# Patient Record
Sex: Female | Born: 1940
Health system: Southern US, Community
[De-identification: ages and names within clinical notes are randomized; demographics above are authoritative.]

## PROBLEM LIST (undated history)

## (undated) DIAGNOSIS — H9190 Unspecified hearing loss, unspecified ear: Secondary | ICD-10-CM

## (undated) DIAGNOSIS — H903 Sensorineural hearing loss, bilateral: Secondary | ICD-10-CM

## (undated) DIAGNOSIS — E785 Hyperlipidemia, unspecified: Secondary | ICD-10-CM

## (undated) DIAGNOSIS — E559 Vitamin D deficiency, unspecified: Secondary | ICD-10-CM

## (undated) DIAGNOSIS — H409 Unspecified glaucoma: Secondary | ICD-10-CM

## (undated) DIAGNOSIS — C801 Malignant (primary) neoplasm, unspecified: Secondary | ICD-10-CM

## (undated) HISTORY — PX: TONSILLECTOMY: SUR1361

## (undated) HISTORY — DX: Hyperlipidemia, unspecified: E78.5

## (undated) HISTORY — PX: DILATION AND CURETTAGE OF UTERUS: SHX78

## (undated) HISTORY — DX: Unspecified glaucoma: H40.9

## (undated) HISTORY — DX: Malignant (primary) neoplasm, unspecified: C80.1

---

## 2001-03-30 HISTORY — PX: MASTECTOMY: SHX3

## 2001-04-06 HISTORY — PX: BREAST SURGERY: SHX581

## 2001-04-14 ENCOUNTER — Encounter: Payer: Self-pay | Admitting: General Surgery

## 2001-04-16 ENCOUNTER — Ambulatory Visit (HOSPITAL_COMMUNITY): Admission: RE | Admit: 2001-04-16 | Discharge: 2001-04-17 | Payer: Self-pay | Admitting: General Surgery

## 2001-05-11 ENCOUNTER — Ambulatory Visit (HOSPITAL_COMMUNITY): Admission: RE | Admit: 2001-05-11 | Discharge: 2001-05-11 | Payer: Self-pay | Admitting: *Deleted

## 2001-05-11 ENCOUNTER — Encounter: Payer: Self-pay | Admitting: *Deleted

## 2001-05-13 ENCOUNTER — Ambulatory Visit (HOSPITAL_COMMUNITY): Admission: RE | Admit: 2001-05-13 | Discharge: 2001-05-13 | Payer: Self-pay | Admitting: General Surgery

## 2001-05-13 ENCOUNTER — Encounter: Payer: Self-pay | Admitting: General Surgery

## 2001-07-07 ENCOUNTER — Encounter: Admission: RE | Admit: 2001-07-07 | Discharge: 2001-07-07 | Payer: Self-pay | Admitting: General Surgery

## 2001-07-07 ENCOUNTER — Encounter: Payer: Self-pay | Admitting: General Surgery

## 2001-08-24 ENCOUNTER — Ambulatory Visit (HOSPITAL_BASED_OUTPATIENT_CLINIC_OR_DEPARTMENT_OTHER): Admission: RE | Admit: 2001-08-24 | Discharge: 2001-08-24 | Payer: Self-pay | Admitting: General Surgery

## 2001-11-24 ENCOUNTER — Encounter: Admission: RE | Admit: 2001-11-24 | Discharge: 2001-12-01 | Payer: Self-pay | Admitting: *Deleted

## 2001-12-02 ENCOUNTER — Ambulatory Visit (HOSPITAL_COMMUNITY): Admission: RE | Admit: 2001-12-02 | Discharge: 2001-12-02 | Payer: Self-pay | Admitting: *Deleted

## 2001-12-02 ENCOUNTER — Encounter: Payer: Self-pay | Admitting: *Deleted

## 2001-12-23 ENCOUNTER — Encounter: Payer: Self-pay | Admitting: *Deleted

## 2001-12-23 ENCOUNTER — Encounter: Admission: RE | Admit: 2001-12-23 | Discharge: 2001-12-23 | Payer: Self-pay | Admitting: *Deleted

## 2001-12-30 ENCOUNTER — Other Ambulatory Visit: Admission: RE | Admit: 2001-12-30 | Discharge: 2001-12-30 | Payer: Self-pay | Admitting: Family Medicine

## 2002-12-27 ENCOUNTER — Ambulatory Visit (HOSPITAL_COMMUNITY): Admission: RE | Admit: 2002-12-27 | Discharge: 2002-12-27 | Payer: Self-pay | Admitting: *Deleted

## 2002-12-27 ENCOUNTER — Encounter: Payer: Self-pay | Admitting: *Deleted

## 2002-12-27 ENCOUNTER — Encounter: Admission: RE | Admit: 2002-12-27 | Discharge: 2002-12-27 | Payer: Self-pay | Admitting: *Deleted

## 2003-01-04 ENCOUNTER — Encounter: Admission: RE | Admit: 2003-01-04 | Discharge: 2003-01-04 | Payer: Self-pay | Admitting: *Deleted

## 2003-01-04 ENCOUNTER — Encounter: Payer: Self-pay | Admitting: *Deleted

## 2003-02-25 ENCOUNTER — Other Ambulatory Visit: Admission: RE | Admit: 2003-02-25 | Discharge: 2003-02-25 | Payer: Self-pay | Admitting: Family Medicine

## 2004-01-05 ENCOUNTER — Encounter: Admission: RE | Admit: 2004-01-05 | Discharge: 2004-01-05 | Payer: Self-pay | Admitting: Oncology

## 2004-02-29 ENCOUNTER — Other Ambulatory Visit: Admission: RE | Admit: 2004-02-29 | Discharge: 2004-02-29 | Payer: Self-pay | Admitting: Family Medicine

## 2004-12-17 ENCOUNTER — Ambulatory Visit: Payer: Self-pay | Admitting: Oncology

## 2005-01-07 ENCOUNTER — Encounter: Admission: RE | Admit: 2005-01-07 | Discharge: 2005-01-07 | Payer: Self-pay | Admitting: Oncology

## 2005-03-05 ENCOUNTER — Other Ambulatory Visit: Admission: RE | Admit: 2005-03-05 | Discharge: 2005-03-05 | Payer: Self-pay | Admitting: Family Medicine

## 2005-03-05 ENCOUNTER — Ambulatory Visit: Payer: Self-pay | Admitting: Family Medicine

## 2005-04-09 ENCOUNTER — Ambulatory Visit: Payer: Self-pay | Admitting: Family Medicine

## 2005-04-23 ENCOUNTER — Ambulatory Visit: Payer: Self-pay | Admitting: Internal Medicine

## 2005-04-23 ENCOUNTER — Ambulatory Visit: Payer: Self-pay | Admitting: Family Medicine

## 2005-05-15 ENCOUNTER — Ambulatory Visit: Payer: Self-pay | Admitting: Family Medicine

## 2005-06-21 ENCOUNTER — Ambulatory Visit: Payer: Self-pay | Admitting: Podiatry

## 2005-07-26 ENCOUNTER — Ambulatory Visit: Payer: Self-pay | Admitting: Family Medicine

## 2005-11-20 ENCOUNTER — Ambulatory Visit: Payer: Self-pay | Admitting: Family Medicine

## 2005-12-13 ENCOUNTER — Ambulatory Visit: Payer: Self-pay | Admitting: Podiatry

## 2005-12-30 ENCOUNTER — Ambulatory Visit: Payer: Self-pay | Admitting: Oncology

## 2005-12-31 LAB — LIPID PANEL
LDL Cholesterol: 105 mg/dL — ABNORMAL HIGH (ref 0–99)
Triglycerides: 115 mg/dL (ref <150)

## 2005-12-31 LAB — COMPREHENSIVE METABOLIC PANEL
ALT: 16 U/L (ref 0–40)
AST: 18 U/L (ref 0–37)
Albumin: 4.4 g/dL (ref 3.5–5.2)
Alkaline Phosphatase: 56 U/L (ref 39–117)
Potassium: 4.4 mEq/L (ref 3.5–5.3)
Sodium: 142 mEq/L (ref 135–145)
Total Protein: 6.5 g/dL (ref 6.0–8.3)

## 2005-12-31 LAB — CBC WITH DIFFERENTIAL/PLATELET
EOS%: 2.9 % (ref 0.0–7.0)
MCH: 31.8 pg (ref 26.0–34.0)
MCV: 93.2 fL (ref 81.0–101.0)
MONO%: 7.4 % (ref 0.0–13.0)
NEUT#: 3.2 10*3/uL (ref 1.5–6.5)
RBC: 4.14 10*6/uL (ref 3.70–5.32)
RDW: 13.3 % (ref 11.3–14.5)

## 2005-12-31 LAB — TSH: TSH: 2.119 u[IU]/mL (ref 0.350–5.500)

## 2006-01-21 ENCOUNTER — Encounter: Admission: RE | Admit: 2006-01-21 | Discharge: 2006-01-21 | Payer: Self-pay | Admitting: Oncology

## 2006-03-06 ENCOUNTER — Encounter: Payer: Self-pay | Admitting: Family Medicine

## 2006-03-06 ENCOUNTER — Other Ambulatory Visit: Admission: RE | Admit: 2006-03-06 | Discharge: 2006-03-06 | Payer: Self-pay | Admitting: Family Medicine

## 2006-03-06 ENCOUNTER — Ambulatory Visit: Payer: Self-pay | Admitting: Family Medicine

## 2006-03-06 LAB — CONVERTED CEMR LAB: Pap Smear: NORMAL

## 2006-03-25 ENCOUNTER — Ambulatory Visit: Payer: Self-pay | Admitting: Family Medicine

## 2006-04-17 ENCOUNTER — Ambulatory Visit: Payer: Self-pay | Admitting: Family Medicine

## 2006-07-21 ENCOUNTER — Ambulatory Visit: Payer: Self-pay | Admitting: Family Medicine

## 2006-11-04 ENCOUNTER — Ambulatory Visit: Payer: Self-pay | Admitting: Family Medicine

## 2006-11-05 ENCOUNTER — Ambulatory Visit: Payer: Self-pay | Admitting: Gynecology

## 2006-12-22 ENCOUNTER — Ambulatory Visit: Payer: Self-pay | Admitting: Oncology

## 2006-12-24 LAB — COMPREHENSIVE METABOLIC PANEL
Alkaline Phosphatase: 55 U/L (ref 39–117)
Glucose, Bld: 123 mg/dL — ABNORMAL HIGH (ref 70–99)
Sodium: 141 mEq/L (ref 135–145)
Total Bilirubin: 0.4 mg/dL (ref 0.3–1.2)
Total Protein: 6.6 g/dL (ref 6.0–8.3)

## 2006-12-24 LAB — CBC WITH DIFFERENTIAL/PLATELET
Eosinophils Absolute: 0.1 10*3/uL (ref 0.0–0.5)
LYMPH%: 29.9 % (ref 14.0–48.0)
MCHC: 35.2 g/dL (ref 32.0–36.0)
MCV: 92.4 fL (ref 81.0–101.0)
MONO%: 6 % (ref 0.0–13.0)
Platelets: 171 10*3/uL (ref 145–400)
RBC: 4.07 10*6/uL (ref 3.70–5.32)

## 2007-01-26 ENCOUNTER — Encounter: Admission: RE | Admit: 2007-01-26 | Discharge: 2007-01-26 | Payer: Self-pay | Admitting: Family Medicine

## 2007-03-09 ENCOUNTER — Telehealth (INDEPENDENT_AMBULATORY_CARE_PROVIDER_SITE_OTHER): Payer: Self-pay | Admitting: *Deleted

## 2007-04-01 ENCOUNTER — Encounter: Payer: Self-pay | Admitting: Family Medicine

## 2007-04-01 DIAGNOSIS — Z853 Personal history of malignant neoplasm of breast: Secondary | ICD-10-CM | POA: Insufficient documentation

## 2007-04-01 DIAGNOSIS — N318 Other neuromuscular dysfunction of bladder: Secondary | ICD-10-CM | POA: Insufficient documentation

## 2007-04-01 DIAGNOSIS — Z13220 Encounter for screening for lipoid disorders: Secondary | ICD-10-CM | POA: Insufficient documentation

## 2007-04-01 DIAGNOSIS — M810 Age-related osteoporosis without current pathological fracture: Secondary | ICD-10-CM | POA: Insufficient documentation

## 2007-05-05 ENCOUNTER — Ambulatory Visit: Payer: Self-pay | Admitting: Family Medicine

## 2007-05-05 DIAGNOSIS — R7989 Other specified abnormal findings of blood chemistry: Secondary | ICD-10-CM | POA: Insufficient documentation

## 2007-05-06 LAB — CONVERTED CEMR LAB
BUN: 14 mg/dL (ref 6–23)
CO2: 31 meq/L (ref 19–32)
GFR calc Af Amer: 128 mL/min
Hgb A1c MFr Bld: 5.1 % (ref 4.6–6.0)
Phosphorus: 3.6 mg/dL (ref 2.3–4.6)
Potassium: 4.1 meq/L (ref 3.5–5.1)

## 2007-05-13 ENCOUNTER — Ambulatory Visit: Payer: Self-pay | Admitting: Family Medicine

## 2007-05-20 ENCOUNTER — Encounter (INDEPENDENT_AMBULATORY_CARE_PROVIDER_SITE_OTHER): Payer: Self-pay | Admitting: *Deleted

## 2007-10-06 ENCOUNTER — Telehealth: Payer: Self-pay | Admitting: Family Medicine

## 2007-12-24 ENCOUNTER — Ambulatory Visit: Payer: Self-pay | Admitting: Oncology

## 2007-12-28 LAB — CBC WITH DIFFERENTIAL/PLATELET
Basophils Absolute: 0 10*3/uL (ref 0.0–0.1)
EOS%: 2.2 % (ref 0.0–7.0)
HGB: 13.1 g/dL (ref 11.6–15.9)
MCH: 32.3 pg (ref 26.0–34.0)
NEUT#: 3.5 10*3/uL (ref 1.5–6.5)
RDW: 13.5 % (ref 11.3–14.5)
WBC: 5.5 10*3/uL (ref 3.9–10.0)
lymph#: 1.4 10*3/uL (ref 0.9–3.3)

## 2007-12-28 LAB — COMPREHENSIVE METABOLIC PANEL
ALT: 10 U/L (ref 0–35)
AST: 14 U/L (ref 0–37)
Albumin: 4.6 g/dL (ref 3.5–5.2)
BUN: 15 mg/dL (ref 6–23)
Calcium: 9.9 mg/dL (ref 8.4–10.5)
Chloride: 106 mEq/L (ref 96–112)
Potassium: 3.7 mEq/L (ref 3.5–5.3)

## 2008-01-18 ENCOUNTER — Encounter: Payer: Self-pay | Admitting: Internal Medicine

## 2008-01-18 ENCOUNTER — Encounter: Payer: Self-pay | Admitting: Family Medicine

## 2008-01-28 ENCOUNTER — Encounter: Admission: RE | Admit: 2008-01-28 | Discharge: 2008-01-28 | Payer: Self-pay | Admitting: Oncology

## 2008-04-11 ENCOUNTER — Telehealth: Payer: Self-pay | Admitting: Family Medicine

## 2008-04-11 DIAGNOSIS — L0293 Carbuncle, unspecified: Secondary | ICD-10-CM

## 2008-04-11 DIAGNOSIS — L0292 Furuncle, unspecified: Secondary | ICD-10-CM | POA: Insufficient documentation

## 2008-04-12 ENCOUNTER — Telehealth: Payer: Self-pay | Admitting: Family Medicine

## 2008-05-09 ENCOUNTER — Ambulatory Visit: Payer: Self-pay | Admitting: Family Medicine

## 2008-05-09 DIAGNOSIS — H6120 Impacted cerumen, unspecified ear: Secondary | ICD-10-CM | POA: Insufficient documentation

## 2008-05-10 LAB — CONVERTED CEMR LAB
ALT: 16 units/L (ref 0–35)
AST: 19 units/L (ref 0–37)
Basophils Relative: 0.9 % (ref 0.0–3.0)
Calcium: 9.2 mg/dL (ref 8.4–10.5)
Cholesterol: 214 mg/dL (ref 0–200)
Direct LDL: 105.6 mg/dL
Eosinophils Relative: 2.7 % (ref 0.0–5.0)
GFR calc Af Amer: 128 mL/min
HCT: 38.7 % (ref 36.0–46.0)
Hemoglobin: 13.4 g/dL (ref 12.0–15.0)
MCHC: 34.5 g/dL (ref 30.0–36.0)
MCV: 95.6 fL (ref 78.0–100.0)
Monocytes Absolute: 0.4 10*3/uL (ref 0.1–1.0)
Monocytes Relative: 7.1 % (ref 3.0–12.0)
Neutro Abs: 3.4 10*3/uL (ref 1.4–7.7)
Phosphorus: 3.5 mg/dL (ref 2.3–4.6)
Potassium: 4.2 meq/L (ref 3.5–5.1)
RDW: 12.6 % (ref 11.5–14.6)
Sodium: 144 meq/L (ref 135–145)
TSH: 1.41 microintl units/mL (ref 0.35–5.50)
Total Protein: 6.4 g/dL (ref 6.0–8.3)

## 2008-05-12 LAB — CONVERTED CEMR LAB: Vit D, 1,25-Dihydroxy: 45 (ref 30–89)

## 2008-05-26 ENCOUNTER — Ambulatory Visit: Payer: Self-pay | Admitting: Family Medicine

## 2008-05-26 LAB — CONVERTED CEMR LAB
OCCULT 2: NEGATIVE
OCCULT 3: NEGATIVE

## 2008-09-30 HISTORY — PX: EYE SURGERY: SHX253

## 2009-01-26 ENCOUNTER — Ambulatory Visit: Payer: Self-pay | Admitting: Oncology

## 2009-01-27 ENCOUNTER — Emergency Department: Payer: Self-pay | Admitting: Emergency Medicine

## 2009-01-27 ENCOUNTER — Encounter: Payer: Self-pay | Admitting: Family Medicine

## 2009-01-30 ENCOUNTER — Telehealth: Payer: Self-pay | Admitting: Family Medicine

## 2009-01-30 DIAGNOSIS — M549 Dorsalgia, unspecified: Secondary | ICD-10-CM | POA: Insufficient documentation

## 2009-02-06 ENCOUNTER — Encounter: Admission: RE | Admit: 2009-02-06 | Discharge: 2009-02-06 | Payer: Self-pay | Admitting: Oncology

## 2009-02-06 ENCOUNTER — Encounter: Payer: Self-pay | Admitting: Family Medicine

## 2009-02-06 LAB — LACTATE DEHYDROGENASE: LDH: 134 U/L (ref 94–250)

## 2009-02-06 LAB — COMPREHENSIVE METABOLIC PANEL
BUN: 24 mg/dL — ABNORMAL HIGH (ref 6–23)
CO2: 29 mEq/L (ref 19–32)
Creatinine, Ser: 0.6 mg/dL (ref 0.40–1.20)
Glucose, Bld: 118 mg/dL — ABNORMAL HIGH (ref 70–99)
Total Bilirubin: 0.3 mg/dL (ref 0.3–1.2)
Total Protein: 6.6 g/dL (ref 6.0–8.3)

## 2009-02-06 LAB — CBC WITH DIFFERENTIAL/PLATELET
Basophils Absolute: 0 10*3/uL (ref 0.0–0.1)
Eosinophils Absolute: 0.1 10*3/uL (ref 0.0–0.5)
HCT: 37.3 % (ref 34.8–46.6)
LYMPH%: 23.1 % (ref 14.0–49.7)
MONO#: 0.4 10*3/uL (ref 0.1–0.9)
NEUT#: 3.5 10*3/uL (ref 1.5–6.5)
NEUT%: 67.7 % (ref 38.4–76.8)
Platelets: 207 10*3/uL (ref 145–400)
WBC: 5.1 10*3/uL (ref 3.9–10.3)

## 2009-02-08 ENCOUNTER — Encounter: Payer: Self-pay | Admitting: Family Medicine

## 2009-02-20 ENCOUNTER — Encounter: Payer: Self-pay | Admitting: Family Medicine

## 2009-05-10 ENCOUNTER — Other Ambulatory Visit: Admission: RE | Admit: 2009-05-10 | Discharge: 2009-05-10 | Payer: Self-pay | Admitting: Family Medicine

## 2009-05-10 ENCOUNTER — Encounter: Payer: Self-pay | Admitting: Family Medicine

## 2009-05-10 ENCOUNTER — Ambulatory Visit: Payer: Self-pay | Admitting: Family Medicine

## 2009-05-10 DIAGNOSIS — Z8781 Personal history of (healed) traumatic fracture: Secondary | ICD-10-CM

## 2009-05-10 DIAGNOSIS — M8448XA Pathological fracture, other site, initial encounter for fracture: Secondary | ICD-10-CM | POA: Insufficient documentation

## 2009-05-12 ENCOUNTER — Encounter (INDEPENDENT_AMBULATORY_CARE_PROVIDER_SITE_OTHER): Payer: Self-pay | Admitting: *Deleted

## 2009-05-12 LAB — CONVERTED CEMR LAB
AST: 23 units/L (ref 0–37)
Alkaline Phosphatase: 55 units/L (ref 39–117)
Bilirubin, Direct: 0.2 mg/dL (ref 0.0–0.3)
CO2: 31 meq/L (ref 19–32)
Creatinine, Ser: 0.6 mg/dL (ref 0.4–1.2)
HDL: 76 mg/dL (ref >39.00)
LDL Cholesterol: 99 mg/dL (ref 0–99)
Phosphorus: 3.9 mg/dL (ref 2.3–4.6)
Sodium: 140 meq/L (ref 135–145)
Total Bilirubin: 0.9 mg/dL (ref 0.3–1.2)
Total CHOL/HDL Ratio: 3

## 2009-05-17 ENCOUNTER — Encounter: Payer: Self-pay | Admitting: Family Medicine

## 2009-05-17 ENCOUNTER — Ambulatory Visit: Payer: Self-pay | Admitting: Internal Medicine

## 2009-06-01 ENCOUNTER — Encounter (INDEPENDENT_AMBULATORY_CARE_PROVIDER_SITE_OTHER): Payer: Self-pay | Admitting: *Deleted

## 2009-06-02 ENCOUNTER — Encounter: Payer: Self-pay | Admitting: Family Medicine

## 2010-02-06 ENCOUNTER — Ambulatory Visit: Payer: Self-pay | Admitting: Oncology

## 2010-02-07 ENCOUNTER — Encounter: Payer: Self-pay | Admitting: Family Medicine

## 2010-02-07 ENCOUNTER — Encounter: Admission: RE | Admit: 2010-02-07 | Discharge: 2010-02-07 | Payer: Self-pay | Admitting: Oncology

## 2010-02-07 LAB — COMPREHENSIVE METABOLIC PANEL
ALT: 14 U/L (ref 0–35)
AST: 17 U/L (ref 0–37)
Alkaline Phosphatase: 56 U/L (ref 39–117)
Sodium: 140 mEq/L (ref 135–145)
Total Bilirubin: 0.5 mg/dL (ref 0.3–1.2)
Total Protein: 6.4 g/dL (ref 6.0–8.3)

## 2010-02-07 LAB — CBC WITH DIFFERENTIAL/PLATELET
EOS%: 2.9 % (ref 0.0–7.0)
HGB: 12.9 g/dL (ref 11.6–15.9)
MCH: 32.3 pg (ref 25.1–34.0)
MCV: 92.9 fL (ref 79.5–101.0)
MONO%: 6.3 % (ref 0.0–14.0)
NEUT#: 2.9 10*3/uL (ref 1.5–6.5)
RBC: 3.98 10*6/uL (ref 3.70–5.45)
RDW: 13.2 % (ref 11.2–14.5)
WBC: 4.6 10*3/uL (ref 3.9–10.3)

## 2010-02-12 ENCOUNTER — Encounter: Payer: Self-pay | Admitting: Family Medicine

## 2010-02-23 ENCOUNTER — Emergency Department: Payer: Self-pay | Admitting: Emergency Medicine

## 2010-04-26 ENCOUNTER — Encounter: Payer: Self-pay | Admitting: Orthopedic Surgery

## 2010-04-30 ENCOUNTER — Encounter: Payer: Self-pay | Admitting: Orthopedic Surgery

## 2010-05-11 ENCOUNTER — Telehealth (INDEPENDENT_AMBULATORY_CARE_PROVIDER_SITE_OTHER): Payer: Self-pay | Admitting: *Deleted

## 2010-05-11 ENCOUNTER — Ambulatory Visit: Payer: Self-pay | Admitting: Family Medicine

## 2010-05-12 LAB — CONVERTED CEMR LAB
AST: 19 units/L (ref 0–37)
BUN: 21 mg/dL (ref 6–23)
Calcium: 9.3 mg/dL (ref 8.4–10.5)
Cholesterol: 193 mg/dL (ref 0–200)
GFR calc non Af Amer: 113.74 mL/min (ref >60)
HDL: 78.9 mg/dL (ref >39.00)
Phosphorus: 3.6 mg/dL (ref 2.3–4.6)
TSH: 1.67 microintl units/mL (ref 0.35–5.50)
Triglycerides: 79 mg/dL (ref 0.0–149.0)
VLDL: 15.8 mg/dL (ref 0.0–40.0)
Vit D, 25-Hydroxy: 56 ng/mL (ref 30–89)

## 2010-05-15 ENCOUNTER — Ambulatory Visit: Payer: Self-pay | Admitting: Family Medicine

## 2010-06-26 ENCOUNTER — Ambulatory Visit: Payer: Self-pay | Admitting: Family Medicine

## 2010-06-26 DIAGNOSIS — R42 Dizziness and giddiness: Secondary | ICD-10-CM | POA: Insufficient documentation

## 2010-06-28 LAB — CONVERTED CEMR LAB
Hemoglobin: 13.4 g/dL (ref 12.0–15.0)
Iron: 103 ug/dL (ref 42–145)

## 2010-07-04 ENCOUNTER — Encounter: Payer: Self-pay | Admitting: Family Medicine

## 2010-10-05 ENCOUNTER — Telehealth: Payer: Self-pay | Admitting: Family Medicine

## 2010-10-20 ENCOUNTER — Other Ambulatory Visit: Payer: Self-pay | Admitting: Oncology

## 2010-10-20 DIAGNOSIS — Z0000 Encounter for general adult medical examination without abnormal findings: Secondary | ICD-10-CM

## 2010-10-20 DIAGNOSIS — Z9010 Acquired absence of unspecified breast and nipple: Secondary | ICD-10-CM

## 2010-10-22 ENCOUNTER — Encounter: Payer: Self-pay | Admitting: Oncology

## 2010-10-30 NOTE — Letter (Signed)
[facility]   Imported By: Lanelle Bal 02/21/2010 10:57:13  _____________________________________________________________________  External Attachment:    Type:   Image     Comment:   External Document

## 2010-10-30 NOTE — Assessment & Plan Note (Signed)
Summary: CHECK UP/CLE   Vital Signs:  Patient profile:   70 year old female Height:      65 inches Weight:      113.75 pounds BMI:     19.00 Temp:     98.2 degrees F oral Pulse rate:   64 / minute Pulse rhythm:   regular BP sitting:   122 / 68  (left arm) Cuff size:   regular  Vitals Entered By: Lewanda Rife LPN (May 15, 2010 10:45 AM) CC: CPX LMP 20 yrs ago   History of Present Illness: here for check up of chronic med problems and to [facility] list   has been feeling good overall   fx her L wrist on may 27-no surgery , trip and fall pretty hard  is healed up pretty well  going to therapy   wt is down 1 lb  bp is 122/68- tood  takes care of herself and does a lot of walking  is getting back to her usual   lipids good control with trig 79 and HDL 78 and LDL 98  vit D good at 56-- on current dose  will be due dexa 8/12   pap 8/10 no new symptoms or sexual partners   mam was nl in 5/11 was taken off the arimidex for 9 years  is cancer free sees Dr Marlena Clipper  is done with fosamax    colonosc nl 04-- is good until 2014   Td 04  ptx 09  will get flu shot at hospital  has had zostavax  recent basal cell lesion on nose- getting set up for mohs proceedure    Allergies (verified): No Known Drug Allergies  Past History:  Family History: Last updated: 05/09/2008 Father: CVA, HTN, OP CHF, TB Mother: CHF, TB, HTN Siblings:  2 cousins with breast ca brother ALS-- passed away January 03, 2023  Social History: Last updated: 05/09/2008 Marital Status: Married Children: 1 Occupation: Clinical biochemist non smoker  no alcohol  volunteers at [employer] walks regularly for exercis   Risk Factors: Smoking Status: never (04/01/2007)  Past Medical History: Breast cancer, hx of Hyperlipidemia Osteoporosis hold in L TM- baseline basal cell lesion on nose --moh's proceedure   surg- Dr Epimenio Foot ortho  Past Surgical  History: Mastectomy Tonsillectomy D & C Dexa- OP (11/1998)  OP (03/2003),  OP (03/2005) Stress fracture (08/2000) Colonoscopy- neg (03/2003) fractured L wrist 5/11 - trip and fall   Review of Systems General:  Denies fatigue, loss of appetite, and malaise. Eyes:  Denies blurring and eye irritation. CV:  Denies chest pain or discomfort, lightheadness, palpitations, and shortness of breath with exertion. Resp:  Denies cough, shortness of breath, and wheezing. GI:  Denies abdominal pain, change in bowel habits, indigestion, and nausea. GU:  Denies dysuria, incontinence, and urinary frequency. MS:  Denies joint pain and cramps. Derm:  Denies itching, lesion(s), poor wound healing, and rash. Neuro:  Denies numbness and tingling. Psych:  Denies anxiety and depression. Endo:  Denies cold intolerance, excessive thirst, excessive urination, and heat intolerance. Heme:  Denies abnormal bruising and bleeding.  Physical Exam  General:  Well-developed,well-nourished,in no acute distress; alert,appropriate and cooperative throughout examination Head:  normocephalic, atraumatic, and no abnormalities observed.   Eyes:  vision grossly intact, pupils equal, pupils round, and pupils reactive to light.  no conjunctival pallor, injection or icterus  Ears:  R ear normal and L ear normal.  - mild cerumen bilat  Nose:  no nasal discharge.  Mouth:  pharynx pink and moist.   Neck:  supple with full rom and no masses or thyromegally, no JVD or carotid bruit  Chest Wall:  No deformities, masses, or tenderness noted. Breasts:  nl R breast exam- no M, skin change or nipple d/c L side - mastectomy site clear/ no M or axillary change  Lungs:  Normal respiratory effort, chest expands symmetrically. Lungs are clear to auscultation, no crackles or wheezes. Heart:  Normal rate and regular rhythm. S1 and S2 normal without gallop, murmur, click, rub or other extra sounds. Abdomen:  Bowel sounds positive,abdomen soft  and non-tender without masses, organomegaly or hernias noted.  no renal bruits  Msk:  No deformity or scoliosis noted of thoracic or lumbar spine.  no acute joint changes  Pulses:  R and L carotid,radial,femoral,dorsalis pedis and posterior tibial pulses are full and equal bilaterally Extremities:  No clubbing, cyanosis, edema, or deformity noted with normal full range of motion of all joints.   Neurologic:  sensation intact to light touch, gait normal, and DTRs symmetrical and normal.   Skin:  Intact without suspicious lesions or rashes Cervical Nodes:  No lymphadenopathy noted Inguinal Nodes:  No significant adenopathy Psych:  normal affect, talkative and pleasant    Impression & Recommendations:  Problem # 1:  OSTEOPOROSIS (ICD-733.00) Assessment Unchanged did have fx this year will stop fosamax since she has been on it over 5 years  disc ca and D  good exercise dexa in 1 y The following medications were removed from the medication list:    Fosamax 70 Mg Tabs (Alendronate sodium) .Marland Kitchen... 1 by mouth weekly  Problem # 2:  HYPERLIPIDEMIA (ICD-272.4) Assessment: Unchanged  good lipids with healthy diet today  Labs Reviewed: SGOT: 19 (05/11/2010)   SGPT: 13 (05/11/2010)   HDL:78.90 (05/11/2010), 76.00 (05/10/2009)  LDL:98 (05/11/2010), 99 (05/10/2009)  Chol:193 (05/11/2010), 191 (05/10/2009)  Trig:79.0 (05/11/2010), 82.0 (05/10/2009)  Problem # 3:  BREAST CANCER, HX OF (ICD-V10.3) Assessment: Unchanged doing very well with close f/u  off arimidex sent for last mam nl breast exam   Complete Medication List: 1)  Lumigan 0.03 % Soln (Bimatoprost) .... Use as directed 2)  Adult Aspirin Low Strength 81 Mg Tbdp (Aspirin) .... Take one by mouth daily 3)  Multiple Vitamins Tabs (Multiple vitamin) 4)  Calcium  .... Take 1200 mg by mouth qd 5)  Timolol Maleate 0.5 % Soln (Timolol maleate) .... 6.8mg  every morning 6)  Vitamin B-12 500 Mcg Tabs (Cyanocobalamin) .... Daily  Patient  Instructions: 1)  send for last mammogram please from breast center  2)  stop the fosamax since you have been on it for over 5 years  3)  try claritin first for allergies  4)  keep up good health habits   Current Allergies (reviewed today): No known allergies

## 2010-10-30 NOTE — Letter (Signed)
Summary: [facility]   Imported By: Sherian Rein 07/31/2010 09:27:59  _____________________________________________________________________  External Attachment:    Type:   Image     Comment:   External Document

## 2010-10-30 NOTE — Progress Notes (Signed)
----   Converted from flag ---- ---- 05/11/2010 8:52 AM, Colon Flattery Tower MD wrote: please check lipid/ renal /ast/alt/tsh/ vit D level- 733.0, 272- thanks   ---- 05/11/2010 8:45 AM, Liane Comber CMA (AAMA) wrote: Pt is scheduled for cpx labs tomorrow, what labs to draw and dx codes? Thanks Tasha ------------------------------

## 2010-10-30 NOTE — Assessment & Plan Note (Signed)
Summary: Dizziness and nausea   Vital Signs:  Patient profile:   70 year old female Height:      65 inches Weight:      112.25 pounds BMI:     18.75 Temp:     97.9 degrees F oral Pulse rate:   64 / minute Pulse rhythm:   regular BP sitting:   106 / 72  (left arm) Cuff size:   regular  Vitals Entered ByMelody Comas (June 26, 2010 3:43 PM) CC: dizziness and nausea    History of Present Illness: Started yesterday, was dizzy on standing.  Intermittent symptoms yesterday, but resolved by 11AM.  Yesterday afternoon patient was fine and walked 3 miles last night.  No pre/syncope.   This AM with similar symptoms that didn't resolve as quickly; feeling some better now.   BP was wnl at home earlier today.  H/o iron def anemia.   No F/C/CP/SOB/BLE edema. Some nausea earlier today when vertigo was at its worst.   No others with similar symptoms.  No rhinorrhea/ear pain.  H/o seasonal allergies but no OTC allergy meds since the weekend.  No tinnitus.   Off fosamax and off arimidex since the summer.   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  Otherwise negative.   No focal neuro changes/speech changes.   Physical Exam  General:  GEN: nad, alert and oriented HEENT: mucous membranes moist, TM w/o acute change, nasal epithelium wnl, OP wnl NECK: supple w/o LA CV: rrr.  no murmur PULM: ctab, no inc wob ABD: soft, +bs EXT: no edema SKIN: no acute rash  CN 2-12 wnl B, S/S/DTR wnl x4, DHP weakly positive   Impression & Recommendations:  Problem # 1:  INTERMITTENT VERTIGO (ICD-780.4) Likely BPV.  will check labs per patient request.  Anatomy d/w patient.  No indication for further work up now.  See notes on labs.  follow up as needed.  Sedation caution for antivert.  Orders: TLB-Iron,  (Fe) Total (83540-FE) TLB-Hemoglobin (Hgb) (85018-HGB)  Her updated medication list for this problem includes:    Antivert 12.5 Mg Tabs (Meclizine hcl) .Marland Kitchen... 1/2 to 1 tab by mouth three times a day as needed for vertigo  Complete Medication List: 1)  Lumigan 0.03 % Soln (Bimatoprost) .... Use as directed 2)  Adult Aspirin Low Strength 81 Mg Tbdp (Aspirin) .... Take one by mouth daily 3)  Multiple Vitamins Tabs (Multiple vitamin) 4)  Calcium  .... Take 1200 mg by mouth qd 5)  Timolol Maleate 0.5 % Soln (Timolol maleate) .... 6.8mg  every morning 6)  Vitamin B-12 500 Mcg Tabs (Cyanocobalamin) .... Daily 7)  Antivert 12.5 Mg Tabs (Meclizine hcl) .... 1/2 to 1 tab by mouth three times a day as needed for vertigo  Patient Instructions: 1)  Use the antivert as needed.  This should gradually get better.  Call us if you continue to have symptoms.  Take care.  Glad to see you today.  Prescriptions: ANTIVERT 12.5 MG TABS (MECLIZINE HCL) 1/2 to 1 tab by mouth three times a day as needed for vertigo  #30 x 1   Entered and Authorized by:   Crawford Givens MD   Signed by:   Crawford Givens MD on 06/26/2010   Method used:   Electronically to  [facility] [phone] * (retail)       [address]       [place], [place]  [address]       Ph: [phone] or [phone]       Fax: [phone]   RxID:   [id number]

## 2010-11-01 NOTE — Progress Notes (Signed)
Summary: requests antibiotic  Phone Note Call from Patient Call back at Home Phone [phone] Call back at [phone]   Caller: Patient Call For: Judith Part MD Summary of Call: Pt is going out of town for a week and is asking for a written script for antibiotic to take with her, in case she develops boils.  She says you have done this for her before, gave cephalexin in the past.  Please call when ready. Initial call taken by: Lowella Petties CMA, AAMA,  October 05, 2010 8:45 AM  Follow-up for Phone Call        she does have hydraadenitis with recurrent boils let her know to f/u if she gets one that is unresponsive to med or fever/ etc px written on EMR for call in  Follow-up by: Judith Part MD,  October 05, 2010 1:22 PM  Additional Follow-up for Phone Call Additional follow up Details #1::        Advised pt, script placed up front for pick up. Additional Follow-up by: Lowella Petties CMA, AAMA,  October 05, 2010 3:29 PM    New/Updated Medications: KEFLEX 500 MG CAPS (CEPHALEXIN) 1 by mouth two times a day for 7 days for boil Prescriptions: KEFLEX 500 MG CAPS (CEPHALEXIN) 1 by mouth two times a day for 7 days for boil  #14 x 0   Entered and Authorized by:   Judith Part MD   Signed by:   Judith Part MD on 10/05/2010   Method used:   Print then Give to Patient   RxID:   [id number]

## 2010-11-07 ENCOUNTER — Ambulatory Visit: Payer: Self-pay | Admitting: Ophthalmology

## 2011-02-11 ENCOUNTER — Ambulatory Visit
Admission: RE | Admit: 2011-02-11 | Discharge: 2011-02-11 | Disposition: A | Discharge status: Home / Self Care | Payer: Medicare Other | Source: Ambulatory Visit | Attending: Oncology | Admitting: Oncology

## 2011-02-11 ENCOUNTER — Other Ambulatory Visit: Payer: Self-pay | Admitting: Oncology

## 2011-02-11 ENCOUNTER — Encounter (HOSPITAL_BASED_OUTPATIENT_CLINIC_OR_DEPARTMENT_OTHER): Payer: Medicare Other | Admitting: Oncology

## 2011-02-11 DIAGNOSIS — Z170 Estrogen receptor positive status [ER+]: Secondary | ICD-10-CM

## 2011-02-11 DIAGNOSIS — Z0000 Encounter for general adult medical examination without abnormal findings: Secondary | ICD-10-CM

## 2011-02-11 DIAGNOSIS — C50419 Malignant neoplasm of upper-outer quadrant of unspecified female breast: Secondary | ICD-10-CM

## 2011-02-11 DIAGNOSIS — Z9010 Acquired absence of unspecified breast and nipple: Secondary | ICD-10-CM

## 2011-02-11 LAB — HM MAMMOGRAPHY

## 2011-02-11 LAB — CBC WITH DIFFERENTIAL/PLATELET
BASO%: 0.5 % (ref 0.0–2.0)
Eosinophils Absolute: 0.1 10*3/uL (ref 0.0–0.5)
LYMPH%: 24.5 % (ref 14.0–49.7)
MCHC: 34 g/dL (ref 31.5–36.0)
MCV: 94.7 fL (ref 79.5–101.0)
MONO%: 6.5 % (ref 0.0–14.0)
NEUT#: 3.3 10*3/uL (ref 1.5–6.5)
Platelets: 158 10*3/uL (ref 145–400)
RBC: 4.27 10*6/uL (ref 3.70–5.45)
RDW: 13.7 % (ref 11.2–14.5)
WBC: 4.9 10*3/uL (ref 3.9–10.3)

## 2011-02-11 LAB — COMPREHENSIVE METABOLIC PANEL
Albumin: 4.4 g/dL (ref 3.5–5.2)
BUN: 17 mg/dL (ref 6–23)
CO2: 29 mEq/L (ref 19–32)
Calcium: 9.6 mg/dL (ref 8.4–10.5)
Chloride: 105 mEq/L (ref 96–112)
Glucose, Bld: 90 mg/dL (ref 70–99)
Potassium: 4.4 mEq/L (ref 3.5–5.3)
Sodium: 140 mEq/L (ref 135–145)
Total Protein: 6.8 g/dL (ref 6.0–8.3)

## 2011-02-11 LAB — LACTATE DEHYDROGENASE: LDH: 154 U/L (ref 94–250)

## 2011-02-15 NOTE — Group Therapy Note (Signed)
NAMEMarland Kitchen  ANTHONIA, MONGER NO.:  [id number]   MEDICAL RECORD NO.:  [id number]         PATIENT TYPE:  POB   LOCATION[facility]  [facility]                   FACILITY:  [facility]   PHYSICIAN:  Ginger Carne, MD DATE OF BIRTH:  1941-04-13   DATE OF SERVICE:                                  CLINIC NOTE   I just completed her dictation.  Please send a copy of this dictation  that I have just previously dictated to Dr. Philis Pique at the [facility] in [place], [place].           ______________________________  Ginger Carne, MD     SHB/MEDQ  D:  11/05/2006  T:  11/05/2006  Job:  [id number]   cc:   Philis Pique, M.D.  [address]  [place], [place]

## 2011-02-15 NOTE — Assessment & Plan Note (Signed)
[facility]                                 ON-CALL NOTE   NAME:[name], Kathy                          MRN:          [id number]  DATE:11/01/2006                            DOB:          Oct 22, 1940    TELEPHONE CALL   TIME:  12:20 p.m.   OBJECTIVE:  The patient has an infection on her buttock. Thinks it is a  gland. Has had that kind of thing before. Declines an appointment in the  office. Will wait until Monday to be seen.   PRIMARY CARE PHYSICIAN:  Unknown.     Arta Silence, MD  Electronically Signed    RNS/MedQ  DD: 11/01/2006  DT: 11/01/2006  Job #: [id number]

## 2011-02-15 NOTE — Op Note (Signed)
[place]. [facility]  Patient:    Kathy [name], Kathy [name]                        MRN: [id number] Proc. Date: 04/16/01 Adm. Date:  [id number] Disc. Date: [id number] Attending:  Chevis Pretty S                           Operative Report  PREOPERATIVE DIAGNOSIS:  Left breast cancer.  POSTOPERATIVE DIAGNOSIS:  Left breast cancer.  OPERATION PERFORMED:  Left modified radical mastectomy.  SURGEON:  Chevis Pretty, M.D.  ASSISTANT:  Rose Phi. Maple Hudson, M.D.  ANESTHESIA:  General endotracheal.  DESCRIPTION OF PROCEDURE:  After informed consent was obtained, the patient was brought to the operating room and placed in supine position on the operating room table.  After adequate induction of general anesthesia. The patients left chest area was prepped with Betadine and draped in the usual sterile manner.  The breast was then marked for an elliptical incision that would include the nipple areolar complex as well as the old excisional biopsy site.  The incisions were then made along these lines with a 10 blade knife. The incisions were carried down through the skin and superficial subcutaneous tissues using the Bovie electrocautery.  Skin hooks were then used to elevate the skin edges towards the ceiling and dissection was then carried out using the Bovie electrocautery.  Thin skin flaps were then created using the Bovie electrocautery and retraction until the breast tissue was separated from the skin and subcutaneous tissues.  The dissection was carried under each skin flap until the chest wall musculature was encountered.  Once the edges of the breast tissue were identified, the breast was then removed from the muscles of the chest wall taking care to take the investing fascia with it.  The breast was removed from the muscles of the chest wall from a superior to inferior manner.  Once this was complete, dissection was carried into the axilla.  The boundaries of the dissection for the  axilla were identified including the axillary vein superiorly, the latissimus laterally and inferiorly and the chest wall medially.  Care was taken to identify the thoracodorsal neurovascular bundle as well as the long thoracic nerve and avoid these two structures.  The lymphatic and axillary contents within all these boundaries were then removed by combination of blunt dissection with a right angle clamp as well as sharp dissection with the Bovie electrocautery.  Several small arteries and veins did require placement of clips for maintenance of hemostasis.  Once this was complete, the entire specimen was removed from the patient en bloc and sent to pathology for further evaluation.  Two small stab incisions were made inferiorly and laterally to the operative site and using a tonsil clamp, drains were brought through these incisions.  The medial drain was made to lie in the area that was created by the skin flaps of the breast. The lateral drain was made to lie at the apex of the axilla.  These drains were anchored in place with 3-0 nylon stitches.  The subcutaneous tissues were then reapproximated using interrupted 3-0 Vicryl sutures and the skin incision was closed with a running 4-0 Monocryl subcuticular stitch.  Benzoin and Steri-Strips were applied and a sterile dressing was applied on top of this. The patient tolerated the procedure well.  At the end of the case, all sponge, needle and instrument counts  were correct.  The patient was awakened and taken to the recovery room in stable condition. DD:  04/24/01 TD:  04/24/01 Job: [id number]

## 2011-02-15 NOTE — Op Note (Signed)
[place]. [facility]  Patient:    Kathy [name], Kathy [name] Visit Number: [id number] MRN: [id number]          Service Type: Attending:  Chevis Pretty, M.D. Dictated by:   Chevis Pretty, M.D. Proc. Date: 08/24/01                             Operative Report  PREOPERATIVE DIAGNOSIS:  Breast cancer.  POSTOPERATIVE DIAGNOSIS:  Breast cancer.  OPERATION PERFORMED:  Removal of Port-A-Cath.  SURGEON:  Chevis Pretty, M.D.  ANESTHESIA:  Local.  DESCRIPTION OF PROCEDURE:  After informed consent was obtained, the patient brought to the operating room and placed in the supine position on the operating table.  After the patients right chest area was prepped with Betadine and draped in the usual sterile fashion, the area around the port was infiltrated with 1% lidocaine with epinephrine and a small incision was made through her old incision that was used to place the port.  A combination of sharp and blunt dissection was then carried out until the port was identified and each of the anchoring stitches was cut and removed.  Once this was complete the port was able to be removed from the patient and the patient was placed in Trendelenburg position and then the tubing was removed from the vein without difficulty.  Pressure was held for several minutes until there was no bleeding from the tract.  The wound was then closed with a running 4-0 Monocryl subcuticular stitch.  Benzoin and Steri-Strips and a sterile dressing were applied.  The patient tolerated the procedure well.  At the end of the case, all sponge, needle and instrument counts were verified correct.  The patient was taken to the recovery room in stable condition. Dictated by:   Chevis Pretty, M.D. Attending:  Chevis Pretty, M.D. DD:  08/24/01 TD:  08/24/01 Job: [id number]

## 2011-02-15 NOTE — Group Therapy Note (Signed)
NAME:  Kathy [name], Kathy [name] NO.:  [id number]   MEDICAL RECORD NO.:  [id number]          PATIENT TYPE:  POB   [facility]ATION[facility]                   [facility]ACILITY[facility]  [facility]   PHYSICIAN:  Ginger Carne, MD DATE OF BIRTH:  10/19/1940   DATE OF SERVICE:                                  CLINIC NOTE   OFFICE NOTE   This patient is a 70 year old Caucasian female referred by Dr. Juel Burrow because of recurrent left Bartholin's gland abscess.  The patient  has had five recurrences, three which required incision and drainage and  two with antibiotic therapy.  Last episode occurred approximately 1 week  ago and prior to this time in October of 2007.  The remainder of her  gynecological and medical history per office records.   PHYSICAL EXAMINATION:  External genitalia, vulva and vagina demonstrates  significant left labial swelling consistent with a previous Bartholin  gland abscesses.  The labia majora is stretched and no palpable masses  are noted beneath the skin.  The right labia is within normal limits.  Freshet and frenulum within normal limits.   IMPRESSION:  Recurrent left Bartholin's gland abscess.   PLAN:  1. I discussed at length the nature and etiology with Kathy [name] about      the nature of Bartholin's gland abscess.  It would be in her best      interest to have a marsupialization performed at the time of her      next infection.  I explained to her that removing an intact      Bartholin's cyst can be very difficult to completely excise.  The      risk of hemorrhage and bleeding is greater than one might expect.      In the past this was a procedure that was performed, but most      people have decided to abandon this technique in favor of      marsupialization or placement of a Word catheter.  2. The patient will contact the office if and when she develops other      abscess.  She understands that we are open 24/7 and has the phone      number to reach [place].   The patient was satisfied with her answers to      her questions.           ______________________________  Ginger Carne, MD     SHB/MEDQ  D:  11/05/2006  T:  11/05/2006  Job:  [id number]

## 2011-02-18 ENCOUNTER — Other Ambulatory Visit: Payer: Self-pay | Admitting: Oncology

## 2011-02-18 ENCOUNTER — Encounter (HOSPITAL_BASED_OUTPATIENT_CLINIC_OR_DEPARTMENT_OTHER): Payer: Medicare Other | Admitting: Oncology

## 2011-02-18 DIAGNOSIS — Z853 Personal history of malignant neoplasm of breast: Secondary | ICD-10-CM

## 2011-02-18 DIAGNOSIS — Z1231 Encounter for screening mammogram for malignant neoplasm of breast: Secondary | ICD-10-CM

## 2011-02-18 DIAGNOSIS — C50419 Malignant neoplasm of upper-outer quadrant of unspecified female breast: Secondary | ICD-10-CM

## 2011-02-18 DIAGNOSIS — Z170 Estrogen receptor positive status [ER+]: Secondary | ICD-10-CM

## 2011-03-01 ENCOUNTER — Encounter (INDEPENDENT_AMBULATORY_CARE_PROVIDER_SITE_OTHER): Payer: Self-pay | Admitting: General Surgery

## 2011-05-11 ENCOUNTER — Telehealth: Payer: Self-pay | Admitting: Family Medicine

## 2011-05-11 DIAGNOSIS — M810 Age-related osteoporosis without current pathological fracture: Secondary | ICD-10-CM

## 2011-05-11 DIAGNOSIS — E785 Hyperlipidemia, unspecified: Secondary | ICD-10-CM

## 2011-05-11 DIAGNOSIS — R7989 Other specified abnormal findings of blood chemistry: Secondary | ICD-10-CM

## 2011-05-11 NOTE — Telephone Encounter (Signed)
Message copied by Judy Pimple on Sat May 11, 2011 10:17 PM ------      Message from: Alvina Chou      Created: Wed May 08, 2011  2:42 PM       Patient is scheduled for Friday, 8-17,CPX labs, please order future labs, Thanks , Camelia Eng

## 2011-05-11 NOTE — Telephone Encounter (Signed)
Message copied by Judy Pimple on Sat May 11, 2011 10:15 PM ------      Message from: Alvina Chou      Created: Wed May 08, 2011  2:42 PM       Patient is scheduled for Friday, 8-17,CPX labs, please order future labs, Thanks , Camelia Eng

## 2011-05-17 ENCOUNTER — Other Ambulatory Visit (INDEPENDENT_AMBULATORY_CARE_PROVIDER_SITE_OTHER): Payer: Medicare Other | Admitting: Family Medicine

## 2011-05-17 ENCOUNTER — Encounter: Payer: Self-pay | Admitting: Family Medicine

## 2011-05-17 DIAGNOSIS — M810 Age-related osteoporosis without current pathological fracture: Secondary | ICD-10-CM

## 2011-05-17 DIAGNOSIS — E785 Hyperlipidemia, unspecified: Secondary | ICD-10-CM

## 2011-05-17 DIAGNOSIS — R7989 Other specified abnormal findings of blood chemistry: Secondary | ICD-10-CM

## 2011-05-17 LAB — COMPREHENSIVE METABOLIC PANEL
ALT: 14 U/L (ref 0–35)
AST: 18 U/L (ref 0–37)
CO2: 32 mEq/L (ref 19–32)
Chloride: 106 mEq/L (ref 96–112)
GFR: 87.66 mL/min (ref >60.00)
Sodium: 143 mEq/L (ref 135–145)
Total Bilirubin: 0.7 mg/dL (ref 0.3–1.2)
Total Protein: 6.5 g/dL (ref 6.0–8.3)

## 2011-05-17 LAB — CBC WITH DIFFERENTIAL/PLATELET
Basophils Absolute: 0 10*3/uL (ref 0.0–0.1)
Eosinophils Absolute: 0.2 10*3/uL (ref 0.0–0.7)
Lymphocytes Relative: 25.7 % (ref 12.0–46.0)
Lymphs Abs: 1.1 10*3/uL (ref 0.7–4.0)
Monocytes Relative: 8 % (ref 3.0–12.0)
Platelets: 150 10*3/uL (ref 150.0–400.0)
RDW: 13.9 % (ref 11.5–14.6)

## 2011-05-17 LAB — LIPID PANEL
HDL: 98 mg/dL (ref >39.00)
Total CHOL/HDL Ratio: 2

## 2011-05-17 LAB — LDL CHOLESTEROL, DIRECT: Direct LDL: 101.6 mg/dL

## 2011-05-18 LAB — VITAMIN D 25 HYDROXY (VIT D DEFICIENCY, FRACTURES): Vit D, 25-Hydroxy: 52 ng/mL (ref 30–89)

## 2011-05-20 ENCOUNTER — Encounter: Payer: Self-pay | Admitting: Family Medicine

## 2011-05-21 ENCOUNTER — Ambulatory Visit (INDEPENDENT_AMBULATORY_CARE_PROVIDER_SITE_OTHER): Payer: Medicare Other | Admitting: Family Medicine

## 2011-05-21 ENCOUNTER — Encounter: Payer: Self-pay | Admitting: Family Medicine

## 2011-05-21 DIAGNOSIS — M810 Age-related osteoporosis without current pathological fracture: Secondary | ICD-10-CM

## 2011-05-21 DIAGNOSIS — Z853 Personal history of malignant neoplasm of breast: Secondary | ICD-10-CM

## 2011-05-21 DIAGNOSIS — E785 Hyperlipidemia, unspecified: Secondary | ICD-10-CM

## 2011-05-21 DIAGNOSIS — M8448XA Pathological fracture, other site, initial encounter for fracture: Secondary | ICD-10-CM

## 2011-05-21 NOTE — Assessment & Plan Note (Signed)
Done with bisphosphenate and breast ca tx Hx of comp fx Check 2 y dexa Vit D level good Disc safety

## 2011-05-21 NOTE — Assessment & Plan Note (Signed)
No further problems Done with bisphosphenates  Check dexa this year

## 2011-05-21 NOTE — Patient Instructions (Signed)
We will refer you for bone density test at follow up  No change in medicines  Continue your calcium and vitamin D and good exercise

## 2011-05-21 NOTE — Progress Notes (Signed)
Subjective:    Patient ID: Kathy [name], female    DOB: 1941-06-16, 70 y.o.   MRN: [id number]  HPI Here for annual check up of chronic medical problems and also to review health mt list  Is feeling good   colonosc 2/04 normal  Tdap 5/04  ptx 09 up to date Zoster vacc 07-- good   Pap 8/10 normal  No problems or new partners No abn paps in the past   Mam 5/12 R normal - good news - 10 years cancer free  Pt had recent breast exam from Dr Cyndie Chime and then he signed off on her  She does not need exam today- but starting at next annual visit I will do them  Lipids are stable with LDL 101.6 with diet control Is happy about that and taking fish oil  Is exercising - walks a lot  Lab Results  Component Value Date   CHOL 205* 05/17/2011   CHOL 193 05/11/2010   CHOL 191 05/10/2009   Lab Results  Component Value Date   HDL 98.00 05/17/2011   HDL 40.98 05/11/2010   HDL 11.91 05/10/2009   Lab Results  Component Value Date   LDLCALC 98 05/11/2010   LDLCALC 99 05/10/2009   Lab Results  Component Value Date   TRIG 61.0 05/17/2011   TRIG 79.0 05/11/2010   TRIG 82.0 05/10/2009   Lab Results  Component Value Date   CHOLHDL 2 05/17/2011   CHOLHDL 2 05/11/2010   CHOLHDL 3 05/10/2009   Lab Results  Component Value Date   LDLDIRECT 101.6 05/17/2011   LDLDIRECT 105.6 05/09/2008   diet is good Wt is stable  Has hx of OP with comp fx Vit D level is 52 Good about taking ca and D Exercise dexa 8/10showed imp in spine  Was on fosamax and ca and D- was on fosamax more than 5 years Needs dexa   Patient Active Problem List  Diagnoses  . HYPERLIPIDEMIA  . OVERACTIVE BLADDER  . BOILS, RECURRENT  . OSTEOPOROSIS  . COMPRESSION FRACTURE, L2 VERTEBRA  . INTERMITTENT VERTIGO  . BREAST CANCER, HX OF   Past Medical History  Diagnosis Date  . Cancer     Hx of breast CA  . Hyperlipidemia   . Osteoporosis    Past Surgical History  Procedure Date  . Mastectomy   . Tonsillectomy   .  Dilation and curettage of uterus    History  Substance Use Topics  . Smoking status: Never Smoker   . Smokeless tobacco: Not on file  . Alcohol Use: No   Family History  Problem Relation Age of Onset  . Heart disease Mother     CHF  . Hypertension Mother   . Stroke Father   . Hypertension Father   . Osteoporosis Father   . Heart disease Father     CHF  . Cancer Cousin     breast CA   No Known Allergies Current Outpatient Prescriptions on File Prior to Visit  Medication Sig Dispense Refill  . aspirin 81 MG tablet Take 81 mg by mouth daily.        . Bimatoprost (LUMIGAN OP) Apply to eye.        . Calcium Carbonate-Vitamin D (CALCIUM 600 + D PO) Take 1 tablet by mouth 2 (two) times daily.       . cyanocobalamin 500 MCG tablet Take 500 mcg by mouth daily.        . Multiple Vitamin (MULTIVITAMIN)  capsule Take 1 capsule by mouth daily.        . Timolol Maleate 0.5 % (DAILY) SOLN Apply 6.8 mg to eye every morning.              Review of Systems Review of Systems  Constitutional: Negative for fever, appetite change, fatigue and unexpected weight change.  Eyes: Negative for pain and visual disturbance.  Respiratory: Negative for cough and shortness of breath.   Cardiovascular: Negative.  for cp or palpitations Gastrointestinal: Negative for nausea, diarrhea and constipation.  Genitourinary: Negative for urgency and frequency.  Skin: Negative for pallor. or rash MSK neg for back pain today Neurological: Negative for weakness, light-headedness, numbness and headaches.  Hematological: Negative for adenopathy. Does not bruise/bleed easily.  Psychiatric/Behavioral: Negative for dysphoric mood. The patient is not nervous/anxious.          Objective:   Physical Exam  Constitutional: She appears well-developed and well-nourished. No distress.       Slim and well appearing   HENT:  Head: Normocephalic and atraumatic.  Right Ear: External ear normal.  Left Ear: External ear  normal.  Nose: Nose normal.  Mouth/Throat: Oropharynx is clear and moist.  Eyes: Conjunctivae and EOM are normal. Pupils are equal, round, and reactive to light. No scleral icterus.  Neck: Normal range of motion. Neck supple. No JVD present. Carotid bruit is not present.  Cardiovascular: Normal rate, regular rhythm, normal heart sounds and intact distal pulses.   Pulmonary/Chest: Effort normal and breath sounds normal. No respiratory distress. She has no wheezes. She exhibits no tenderness.  Abdominal: Soft. Bowel sounds are normal. She exhibits no distension and no mass. There is no tenderness.  Musculoskeletal: Normal range of motion. She exhibits no edema and no tenderness.       Very slight kyphosis   Lymphadenopathy:    She has no cervical adenopathy.  Neurological: She is alert. She has normal reflexes. No cranial nerve deficit. Coordination normal.  Skin: Skin is warm and dry. No rash noted. No erythema. No pallor.  Psychiatric: She has a normal mood and affect.          Assessment & Plan:

## 2011-05-21 NOTE — Assessment & Plan Note (Signed)
Improved with diet Great hdl with fish oil and exercise Rev  Labs with pt in detail Rev low sat fat diet

## 2011-05-21 NOTE — Assessment & Plan Note (Signed)
Is up to date with mam and exam Does self exams Dr Cyndie Chime did breast exam in may (so we skipped it today)  Then he signed off on her Will plan pap and gyn exam next year here

## 2011-05-28 ENCOUNTER — Telehealth: Payer: Self-pay | Admitting: Family Medicine

## 2011-05-28 ENCOUNTER — Ambulatory Visit (INDEPENDENT_AMBULATORY_CARE_PROVIDER_SITE_OTHER)
Admission: RE | Admit: 2011-05-28 | Discharge: 2011-05-28 | Disposition: A | Discharge status: Home / Self Care | Payer: Medicare Other | Source: Ambulatory Visit

## 2011-05-28 DIAGNOSIS — M8448XA Pathological fracture, other site, initial encounter for fracture: Secondary | ICD-10-CM

## 2011-05-28 DIAGNOSIS — M810 Age-related osteoporosis without current pathological fracture: Secondary | ICD-10-CM

## 2011-05-28 NOTE — Telephone Encounter (Signed)
Needs lumbovertebral assessment with dexa at [facility] since she has had a fx in the past

## 2011-06-13 ENCOUNTER — Encounter: Payer: Self-pay | Admitting: *Deleted

## 2011-06-13 ENCOUNTER — Encounter: Payer: Self-pay | Admitting: Family Medicine

## 2011-08-27 ENCOUNTER — Encounter (INDEPENDENT_AMBULATORY_CARE_PROVIDER_SITE_OTHER): Payer: Self-pay | Admitting: General Surgery

## 2011-08-27 ENCOUNTER — Ambulatory Visit (INDEPENDENT_AMBULATORY_CARE_PROVIDER_SITE_OTHER): Payer: Medicare Other | Admitting: General Surgery

## 2011-08-27 VITALS — BP 140/64 | HR 74 | Temp 96.9°F | Resp 18 | Ht 67.5 in | Wt 110.6 lb

## 2011-08-27 DIAGNOSIS — Z853 Personal history of malignant neoplasm of breast: Secondary | ICD-10-CM

## 2011-08-27 NOTE — Patient Instructions (Signed)
Continue regular self exams  

## 2011-09-06 NOTE — Progress Notes (Signed)
Subjective:     Patient ID: Eloy End, female   DOB: [birth year], 70 y.o.   MRN: [id number]  HPI The patient is a 70 year old white female who is now 10 years out from a left modified radical mastectomy for a T2 N0 left breast cancer. She's doing very well. She has no complaints today. Her last mammogram was in May that showed no evidence of malignancy. She's had no pain. She has no discharge from her right nipple.  Review of Systems  Constitutional: Negative.   HENT: Negative.   Eyes: Negative.   Respiratory: Negative.   Cardiovascular: Negative.   Gastrointestinal: Negative.   Genitourinary: Negative.   Musculoskeletal: Negative.   Skin: Negative.   Neurological: Negative.   Hematological: Negative.   Psychiatric/Behavioral: Negative.        Objective:   Physical Exam  Constitutional: She is oriented to person, place, and time. She appears well-developed and well-nourished.  HENT:  Head: Normocephalic and atraumatic.  Eyes: Conjunctivae and EOM are normal. Pupils are equal, round, and reactive to light.  Neck: Normal range of motion. Neck supple.  Cardiovascular: Normal rate, regular rhythm and normal heart sounds.   Pulmonary/Chest: Effort normal and breath sounds normal.       No palpable mass of the left chest wall. No palpable mass of the right breast. No axillary supraclavicular or cervical lymphadenopathy.  Abdominal: Soft. Bowel sounds are normal.  Musculoskeletal: Normal range of motion.  Neurological: She is alert and oriented to person, place, and time.  Skin: Skin is warm and dry.  Psychiatric: She has a normal mood and affect. Her behavior is normal.       Assessment:     10 years out from a left modified radical mastectomy    Plan:     At this point I have encouraged her to continue regular self exams. We will follow her up in another year. She agrees to call me if she has any problems in the meantime.

## 2011-10-31 DIAGNOSIS — H4010X0 Unspecified open-angle glaucoma, stage unspecified: Secondary | ICD-10-CM | POA: Diagnosis not present

## 2012-02-12 ENCOUNTER — Encounter (HOSPITAL_COMMUNITY): Payer: Medicare Other

## 2012-02-19 ENCOUNTER — Ambulatory Visit
Admission: RE | Admit: 2012-02-19 | Discharge: 2012-02-19 | Disposition: A | Discharge status: Home / Self Care | Payer: Medicare Other | Source: Ambulatory Visit | Attending: Oncology | Admitting: Oncology

## 2012-02-19 DIAGNOSIS — Z1231 Encounter for screening mammogram for malignant neoplasm of breast: Secondary | ICD-10-CM

## 2012-02-19 DIAGNOSIS — Z853 Personal history of malignant neoplasm of breast: Secondary | ICD-10-CM

## 2012-05-04 DIAGNOSIS — H4010X0 Unspecified open-angle glaucoma, stage unspecified: Secondary | ICD-10-CM | POA: Diagnosis not present

## 2012-05-18 ENCOUNTER — Telehealth: Payer: Self-pay | Admitting: Family Medicine

## 2012-05-18 DIAGNOSIS — E785 Hyperlipidemia, unspecified: Secondary | ICD-10-CM

## 2012-05-18 DIAGNOSIS — L0292 Furuncle, unspecified: Secondary | ICD-10-CM

## 2012-05-18 DIAGNOSIS — M810 Age-related osteoporosis without current pathological fracture: Secondary | ICD-10-CM

## 2012-05-18 NOTE — Telephone Encounter (Signed)
Message copied by Judy Pimple on Mon May 18, 2012 10:08 PM ------      Message from: Alvina Chou      Created: Thu May 14, 2012  4:23 PM      Regarding: lab orders for Tue, 8.20.13       Patient is scheduled for CPX labs, please order future labs, Thanks , Camelia Eng

## 2012-05-19 ENCOUNTER — Other Ambulatory Visit (INDEPENDENT_AMBULATORY_CARE_PROVIDER_SITE_OTHER): Payer: Medicare Other

## 2012-05-19 DIAGNOSIS — E785 Hyperlipidemia, unspecified: Secondary | ICD-10-CM | POA: Diagnosis not present

## 2012-05-19 DIAGNOSIS — L0292 Furuncle, unspecified: Secondary | ICD-10-CM

## 2012-05-19 DIAGNOSIS — M810 Age-related osteoporosis without current pathological fracture: Secondary | ICD-10-CM

## 2012-05-19 DIAGNOSIS — L0293 Carbuncle, unspecified: Secondary | ICD-10-CM

## 2012-05-19 LAB — CBC WITH DIFFERENTIAL/PLATELET
Basophils Relative: 0.5 % (ref 0.0–3.0)
Eosinophils Absolute: 0.2 10*3/uL (ref 0.0–0.7)
Lymphs Abs: 1.2 10*3/uL (ref 0.7–4.0)
MCHC: 33.3 g/dL (ref 30.0–36.0)
MCV: 95.4 fl (ref 78.0–100.0)
Monocytes Absolute: 0.4 10*3/uL (ref 0.1–1.0)
Neutro Abs: 3.4 10*3/uL (ref 1.4–7.7)
Neutrophils Relative %: 65.1 % (ref 43.0–77.0)
RBC: 4.28 Mil/uL (ref 3.87–5.11)

## 2012-05-19 LAB — COMPREHENSIVE METABOLIC PANEL
AST: 21 U/L (ref 0–37)
Albumin: 4.2 g/dL (ref 3.5–5.2)
Alkaline Phosphatase: 51 U/L (ref 39–117)
BUN: 25 mg/dL — ABNORMAL HIGH (ref 6–23)
Potassium: 4.5 mEq/L (ref 3.5–5.1)
Sodium: 142 mEq/L (ref 135–145)

## 2012-05-19 LAB — LDL CHOLESTEROL, DIRECT: Direct LDL: 99.8 mg/dL

## 2012-05-19 LAB — LIPID PANEL
Total CHOL/HDL Ratio: 2
VLDL: 14.6 mg/dL (ref 0.0–40.0)

## 2012-05-26 ENCOUNTER — Encounter: Payer: Self-pay | Admitting: Family Medicine

## 2012-05-26 ENCOUNTER — Ambulatory Visit (INDEPENDENT_AMBULATORY_CARE_PROVIDER_SITE_OTHER): Payer: Medicare Other | Admitting: Family Medicine

## 2012-05-26 VITALS — BP 125/80 | HR 62 | Temp 98.0°F | Ht 67.0 in | Wt 111.8 lb

## 2012-05-26 DIAGNOSIS — E785 Hyperlipidemia, unspecified: Secondary | ICD-10-CM | POA: Diagnosis not present

## 2012-05-26 DIAGNOSIS — Z853 Personal history of malignant neoplasm of breast: Secondary | ICD-10-CM | POA: Diagnosis not present

## 2012-05-26 DIAGNOSIS — R42 Dizziness and giddiness: Secondary | ICD-10-CM | POA: Insufficient documentation

## 2012-05-26 DIAGNOSIS — M810 Age-related osteoporosis without current pathological fracture: Secondary | ICD-10-CM | POA: Diagnosis not present

## 2012-05-26 MED ORDER — MECLIZINE HCL 12.5 MG PO TABS: 12.5000 mg | ORAL_TABLET | Freq: Three times a day (TID) | ORAL | Status: DC | PRN

## 2012-05-26 NOTE — Patient Instructions (Addendum)
Labs look good  Maintain your weight and stay active Try to drink more water

## 2012-05-26 NOTE — Progress Notes (Signed)
Subjective:    Patient ID: Kathy [name], female    DOB: 1941-04-30, 71 y.o.   MRN: [id number]  HPI Here for check up of chronic medical conditions and to review health mt list  Had a good summer   Is feeling fine  Nothing new is going on   Wt is stable with bmi of 17  Hx of breast cancer with L mastectomy mammo 5/13 Self exam- no lumps in R breast  oncol turned her loose -- 11 years cancer free   OP dexa 1 y ago  Did finish course of bisph in past Also has had comp fx in past Vit D level good at 61  Lipid Lab Results  Component Value Date   CHOL 204* 05/19/2012   HDL 90.50 05/19/2012   LDLCALC 98 05/11/2010   LDLDIRECT 99.8 05/19/2012   TRIG 73.0 05/19/2012   CHOLHDL 2 05/19/2012       Chemistry      Component Value Date/Time   NA 142 05/19/2012 0913   K 4.5 05/19/2012 0913   CL 105 05/19/2012 0913   CO2 31 05/19/2012 0913   BUN 25* 05/19/2012 0913   CREATININE 0.7 05/19/2012 0913      Component Value Date/Time   CALCIUM 9.5 05/19/2012 0913   ALKPHOS 51 05/19/2012 0913   AST 21 05/19/2012 0913   ALT 19 05/19/2012 0913   BILITOT 0.8 05/19/2012 0913     Is not a good water drinker -needs to be better   She walks almost every day  Has not had hysterectomy No abn paps  No new partners    Patient Active Problem List  Diagnosis  . HYPERLIPIDEMIA  . OVERACTIVE BLADDER  . BOILS, RECURRENT  . OSTEOPOROSIS  . COMPRESSION FRACTURE, L2 VERTEBRA  . INTERMITTENT VERTIGO  . BREAST CANCER, HX OF   Past Medical History  Diagnosis Date  . Cancer     Hx of breast CA  . Hyperlipidemia   . Osteoporosis    Past Surgical History  Procedure Date  . Mastectomy 03/2001    left  . Tonsillectomy   . Dilation and curettage of uterus   . Breast surgery 04/06/2001    mastectomy - left side  . Eye surgery 2010    cataract - left   History  Substance Use Topics  . Smoking status: Never Smoker   . Smokeless tobacco: Never Used  . Alcohol Use: No   Family History  Problem  Relation Age of Onset  . Heart disease Mother     CHF  . Hypertension Mother   . Stroke Father   . Hypertension Father   . Osteoporosis Father   . Heart disease Father     CHF  . Cancer Cousin     breast CA   No Known Allergies Current Outpatient Prescriptions on File Prior to Visit  Medication Sig Dispense Refill  . aspirin 81 MG tablet Take 81 mg by mouth daily.        . Bimatoprost (LUMIGAN OP) Apply to eye.        . Calcium Carbonate-Vitamin D (CALCIUM 600 + D PO) Take 1 tablet by mouth 2 (two) times daily.       . cyanocobalamin 500 MCG tablet Take 1,500 mcg by mouth daily.       . fish oil-omega-3 fatty acids 1000 MG capsule Take 2 g by mouth 2 (two) times daily.        . Multiple Vitamin (MULTIVITAMIN)  capsule Take 1 capsule by mouth daily.        . Timolol Maleate 0.5 % (DAILY) SOLN Apply 6.8 mg to eye every morning.             Review of Systems Review of Systems  Constitutional: Negative for fever, appetite change, fatigue and unexpected weight change.  Eyes: Negative for pain and visual disturbance.  Respiratory: Negative for cough and shortness of breath.   Cardiovascular: Negative for cp or palpitations    Gastrointestinal: Negative for nausea, diarrhea and constipation.  Genitourinary: Negative for urgency and frequency.  Skin: Negative for pallor or rash   Neurological: Negative for weakness, light-headedness, numbness and headaches.  Hematological: Negative for adenopathy. Does not bruise/bleed easily.  Psychiatric/Behavioral: Negative for dysphoric mood. The patient is not nervous/anxious.         Objective:   Physical Exam  Constitutional: She appears well-developed and well-nourished. No distress.  HENT:  Head: Normocephalic and atraumatic.  Right Ear: External ear normal.  Left Ear: External ear normal.  Nose: Nose normal.  Mouth/Throat: Oropharynx is clear and moist.  Eyes: Conjunctivae and EOM are normal. Pupils are equal, round, and reactive  to light. No scleral icterus.  Neck: Normal range of motion. Neck supple. No JVD present. Carotid bruit is not present. No thyromegaly present.  Cardiovascular: Normal rate, regular rhythm and normal heart sounds.  Exam reveals no gallop.   Pulmonary/Chest: Effort normal and breath sounds normal. No respiratory distress. She has no wheezes.  Abdominal: Soft. Bowel sounds are normal. She exhibits no distension and no abdominal bruit. There is no tenderness.  Genitourinary: No breast swelling, tenderness, discharge or bleeding.       R breast exam nl  Breast exam: No mass, nodules, thickening, tenderness, bulging, retraction, inflamation, nipple discharge or skin changes noted.  No axillary or clavicular LA.  Chaperoned exam.    Musculoskeletal: She exhibits no edema and no tenderness.  Lymphadenopathy:    She has no cervical adenopathy.  Neurological: She is alert. She has normal reflexes. No cranial nerve deficit. Coordination normal.  Skin: Skin is warm and dry. No rash noted. No erythema. No pallor.  Psychiatric: She has a normal mood and affect.          Assessment & Plan:

## 2012-06-01 NOTE — Assessment & Plan Note (Signed)
Disc goals for lipids and reasons to control them Rev labs with pt Rev low sat fat diet in detail   

## 2012-06-01 NOTE — Assessment & Plan Note (Signed)
Normal R breast exam today  Nl mammo  Pt keeps up with self exams

## 2012-06-01 NOTE — Assessment & Plan Note (Signed)
utd dexa Comp fx in past and also finished course of bisphosphenate  Vit D level is normal  Is compliant with ca and D Next dexa about a year

## 2012-06-29 DIAGNOSIS — M79609 Pain in unspecified limb: Secondary | ICD-10-CM | POA: Diagnosis not present

## 2012-06-29 DIAGNOSIS — D2370 Other benign neoplasm of skin of unspecified lower limb, including hip: Secondary | ICD-10-CM | POA: Diagnosis not present

## 2012-07-03 ENCOUNTER — Encounter (INDEPENDENT_AMBULATORY_CARE_PROVIDER_SITE_OTHER): Payer: Self-pay | Admitting: General Surgery

## 2012-07-03 ENCOUNTER — Ambulatory Visit (INDEPENDENT_AMBULATORY_CARE_PROVIDER_SITE_OTHER): Payer: Medicare Other | Admitting: General Surgery

## 2012-07-03 VITALS — BP 106/62 | HR 69 | Temp 97.4°F | Resp 18 | Ht 67.0 in | Wt 110.6 lb

## 2012-07-03 DIAGNOSIS — Z853 Personal history of malignant neoplasm of breast: Secondary | ICD-10-CM

## 2012-07-03 NOTE — Progress Notes (Signed)
Subjective:     Patient ID: Kathy [name], female   DOB: 03/01/1941, 71 y.o.   MRN: [id number]  HPI The patient is a 71 year old white female who is 10 years out from a left modified radical mastectomy for a T2 N0 left breast cancer. She has done well during the last year. She has no complaints. She denies any chest wall or breast pain. She denies any discharge or nipple. Her she has had no major medical problems in the last year.  Review of Systems  Constitutional: Negative.   HENT: Negative.   Eyes: Negative.   Respiratory: Negative.   Cardiovascular: Negative.   Gastrointestinal: Negative.   Genitourinary: Negative.   Musculoskeletal: Negative.   Skin: Negative.   Neurological: Negative.   Hematological: Negative.   Psychiatric/Behavioral: Negative.        Objective:   Physical Exam  Constitutional: She is oriented to person, place, and time. She appears well-developed and well-nourished.  HENT:  Head: Normocephalic and atraumatic.  Eyes: Conjunctivae normal and EOM are normal. Pupils are equal, round, and reactive to light.  Neck: Normal range of motion. Neck supple.  Cardiovascular: Normal rate, regular rhythm and normal heart sounds.   Pulmonary/Chest: Effort normal and breath sounds normal.       Her left mastectomy incision has healed nicely. There is no palpable mass of the left chest wall. There is no palpable mass of the right breast. No palpable axillary or supraclavicular cervical lymphadenopathy.  Abdominal: Soft. Bowel sounds are normal. She exhibits no mass. There is no tenderness.  Musculoskeletal: Normal range of motion.  Lymphadenopathy:    She has no cervical adenopathy.  Neurological: She is alert and oriented to person, place, and time.  Skin: Skin is warm.  Psychiatric: She has a normal mood and affect. Her behavior is normal.       Assessment:     10 years status post left modified radical mastectomy    Plan:     At this point she is doing well.  She will continue to do regular self exams. We will plan to see her back in one year. Her recent mammogram showed no evidence of malignancy.

## 2012-07-03 NOTE — Patient Instructions (Signed)
Continue to do regular self exams 

## 2012-07-27 DIAGNOSIS — D2370 Other benign neoplasm of skin of unspecified lower limb, including hip: Secondary | ICD-10-CM | POA: Diagnosis not present

## 2012-10-05 ENCOUNTER — Telehealth (INDEPENDENT_AMBULATORY_CARE_PROVIDER_SITE_OTHER): Payer: Self-pay

## 2012-10-05 NOTE — Telephone Encounter (Signed)
Message copied by Brennan Bailey on Mon Oct 05, 2012  4:46 PM ------      Message from: Isaias Sakai K      Created: Mon Oct 05, 2012  2:18 PM      Regarding: Dr Carolynne Edouard      Contact: [phone]       Would like new rx for ([facility]) faxed to  [facility] medical supply store in [place]. Phone# [phone]. Fax# [phone]. If no anwswer @ home, can call patient Cell [phone].

## 2012-10-05 NOTE — Telephone Encounter (Signed)
I called patient to see what prescription she needed refilled since message I got wasn't clear. She states she needs new prescription for bra prosthesis faxed to [phone]. I told her Dr Carolynne Edouard will be in in the morning and I would fax it then.

## 2012-11-02 DIAGNOSIS — H4010X0 Unspecified open-angle glaucoma, stage unspecified: Secondary | ICD-10-CM | POA: Diagnosis not present

## 2012-12-18 DIAGNOSIS — H4010X0 Unspecified open-angle glaucoma, stage unspecified: Secondary | ICD-10-CM | POA: Diagnosis not present

## 2013-01-11 ENCOUNTER — Telehealth: Payer: Self-pay | Admitting: Family Medicine

## 2013-01-11 DIAGNOSIS — Z0279 Encounter for issue of other medical certificate: Secondary | ICD-10-CM

## 2013-01-11 NOTE — Telephone Encounter (Signed)
Done and in IN box 

## 2013-01-11 NOTE — Telephone Encounter (Signed)
Form placed in your inbox

## 2013-01-11 NOTE — Telephone Encounter (Signed)
Pt dropped off a Release for Activity form to be completed.  She is requesting to pick it up tomorrow, if possible.  I advised her I would give it to you and she would be called once it's completed. I have placed it on your desk. Thank you. °

## 2013-01-12 NOTE — Telephone Encounter (Signed)
Pt notified form ready for pick-up and advise of fee 

## 2013-01-18 ENCOUNTER — Other Ambulatory Visit: Payer: Self-pay

## 2013-01-18 DIAGNOSIS — Z1231 Encounter for screening mammogram for malignant neoplasm of breast: Secondary | ICD-10-CM

## 2013-02-23 ENCOUNTER — Other Ambulatory Visit: Payer: Self-pay

## 2013-02-23 ENCOUNTER — Ambulatory Visit
Admission: RE | Admit: 2013-02-23 | Discharge: 2013-02-23 | Disposition: A | Discharge status: Home / Self Care | Payer: Medicare Other | Source: Ambulatory Visit

## 2013-02-23 DIAGNOSIS — Z1231 Encounter for screening mammogram for malignant neoplasm of breast: Secondary | ICD-10-CM | POA: Diagnosis not present

## 2013-03-18 DIAGNOSIS — H4010X0 Unspecified open-angle glaucoma, stage unspecified: Secondary | ICD-10-CM | POA: Diagnosis not present

## 2013-03-30 ENCOUNTER — Encounter: Payer: Self-pay | Admitting: *Deleted

## 2013-04-07 ENCOUNTER — Encounter: Payer: Self-pay | Admitting: Internal Medicine

## 2013-05-17 ENCOUNTER — Encounter: Payer: Self-pay | Admitting: Internal Medicine

## 2013-05-23 ENCOUNTER — Telehealth: Payer: Self-pay | Admitting: Family Medicine

## 2013-05-23 DIAGNOSIS — Z0000 Encounter for general adult medical examination without abnormal findings: Secondary | ICD-10-CM | POA: Insufficient documentation

## 2013-05-23 DIAGNOSIS — E785 Hyperlipidemia, unspecified: Secondary | ICD-10-CM

## 2013-05-23 DIAGNOSIS — M810 Age-related osteoporosis without current pathological fracture: Secondary | ICD-10-CM

## 2013-05-23 NOTE — Telephone Encounter (Signed)
Message copied by Judy Pimple on Sun May 23, 2013  5:04 PM ------      Message from: Alvina Chou      Created: Wed May 19, 2013  3:00 PM      Regarding: Lab orders for Monday, 8.25.14       Patient is scheduled for CPX labs, please order future labs, Thanks , Terri       ------

## 2013-05-24 ENCOUNTER — Other Ambulatory Visit (INDEPENDENT_AMBULATORY_CARE_PROVIDER_SITE_OTHER): Payer: Medicare Other

## 2013-05-24 DIAGNOSIS — M810 Age-related osteoporosis without current pathological fracture: Secondary | ICD-10-CM

## 2013-05-24 DIAGNOSIS — E785 Hyperlipidemia, unspecified: Secondary | ICD-10-CM

## 2013-05-24 DIAGNOSIS — Z0000 Encounter for general adult medical examination without abnormal findings: Secondary | ICD-10-CM | POA: Diagnosis not present

## 2013-05-24 LAB — LIPID PANEL
Cholesterol: 209 mg/dL — ABNORMAL HIGH (ref 0–200)
HDL: 86.9 mg/dL (ref >39.00)
Triglycerides: 67 mg/dL (ref 0.0–149.0)

## 2013-05-24 LAB — COMPREHENSIVE METABOLIC PANEL
AST: 22 U/L (ref 0–37)
Alkaline Phosphatase: 53 U/L (ref 39–117)
BUN: 16 mg/dL (ref 6–23)
Calcium: 9.5 mg/dL (ref 8.4–10.5)
Chloride: 105 mEq/L (ref 96–112)
Creatinine, Ser: 0.6 mg/dL (ref 0.4–1.2)

## 2013-05-24 LAB — CBC WITH DIFFERENTIAL/PLATELET
Basophils Relative: 0.9 % (ref 0.0–3.0)
Eosinophils Absolute: 0.3 10*3/uL (ref 0.0–0.7)
HCT: 40.5 % (ref 36.0–46.0)
Hemoglobin: 13.8 g/dL (ref 12.0–15.0)
MCHC: 34 g/dL (ref 30.0–36.0)
MCV: 94.8 fl (ref 78.0–100.0)
Monocytes Absolute: 0.4 10*3/uL (ref 0.1–1.0)
Neutro Abs: 2.7 10*3/uL (ref 1.4–7.7)
RBC: 4.27 Mil/uL (ref 3.87–5.11)

## 2013-05-24 LAB — TSH: TSH: 2.13 u[IU]/mL (ref 0.35–5.50)

## 2013-05-26 DIAGNOSIS — D2370 Other benign neoplasm of skin of unspecified lower limb, including hip: Secondary | ICD-10-CM | POA: Diagnosis not present

## 2013-05-28 ENCOUNTER — Ambulatory Visit (INDEPENDENT_AMBULATORY_CARE_PROVIDER_SITE_OTHER): Payer: Medicare Other | Admitting: Family Medicine

## 2013-05-28 ENCOUNTER — Encounter: Payer: Self-pay | Admitting: Family Medicine

## 2013-05-28 VITALS — BP 144/70 | HR 55 | Temp 97.5°F | Ht 65.25 in | Wt 108.8 lb

## 2013-05-28 DIAGNOSIS — Z0000 Encounter for general adult medical examination without abnormal findings: Secondary | ICD-10-CM

## 2013-05-28 DIAGNOSIS — M810 Age-related osteoporosis without current pathological fracture: Secondary | ICD-10-CM

## 2013-05-28 DIAGNOSIS — E785 Hyperlipidemia, unspecified: Secondary | ICD-10-CM | POA: Diagnosis not present

## 2013-05-28 NOTE — Progress Notes (Signed)
Subjective:    Patient ID: Kathy [name], female    DOB: [birth year], 72 y.o.   MRN: [id number]  HPI I have personally reviewed the Medicare Annual Wellness questionnaire and have noted 1. The patient's medical and social history 2. Their use of alcohol, tobacco or illicit drugs 3. Their current medications and supplements 4. The patient's functional ability including ADL's, fall risks, home safety risks and hearing or visual             impairment. 5. Diet and physical activities 6. Evidence for depression or mood disorders  The patients weight, height, BMI have been recorded in the chart and visual acuity is per eye clinic.  I have made referrals, counseling and provided education to the patient based review of the above and I have provided the pt with a written personalized care plan for preventive services.  Is doing well  Had a good summer   Hearing is worse   See scanned forms.  Routine anticipatory guidance given to patient.  See health maintenance. Flu- she had her flu shot [facility] hosp[facility]  Shingles- had her vaccine in 07 PNA- 8/09  Tdap- due for - will get at the health dept Colon -has been 10 years - has that planned oct 29th  Breast cancer screening- good mammo in 5/14 12 years since she had her cancer Nl self exam  Advance directive- has a living will  Cognitive function addressed- see scanned forms- and if abnormal then additional documentation follows.-- she thinks her memory is fine    Falls - none- she is careful   Mood - is very chipper and upbeat   OP dexa 8/12 -slt dec in spine  D level is 56  Hyperlipidemia Lab Results  Component Value Date   CHOL 209* 05/24/2013   CHOL 204* 05/19/2012   CHOL 205* 05/17/2011   Lab Results  Component Value Date   HDL 86.90 05/24/2013   HDL 40.98 05/19/2012   HDL 11.91 05/17/2011   Lab Results  Component Value Date   LDLCALC 98 05/11/2010   LDLCALC 99 05/10/2009   LDLCALC 105* 12/31/2005   Lab  Results  Component Value Date   TRIG 67.0 05/24/2013   TRIG 73.0 05/19/2012   TRIG 61.0 05/17/2011   Lab Results  Component Value Date   CHOLHDL 2 05/24/2013   CHOLHDL 2 05/19/2012   CHOLHDL 2 05/17/2011   Lab Results  Component Value Date   LDLDIRECT 107.3 05/24/2013   LDLDIRECT 99.8 05/19/2012   LDLDIRECT 101.6 05/17/2011    Cholesterol profile is good   She exercises regularly PMH and SH reviewed  Meds, vitals, and allergies reviewed.   ROS: See HPI.  Otherwise negative.     Patient Active Problem List   Diagnosis Date Noted  . Encounter for Medicare annual wellness exam 05/23/2013  . Vertigo 05/26/2012  . INTERMITTENT VERTIGO 06/26/2010  . COMPRESSION FRACTURE, L2 VERTEBRA 05/10/2009  . BOILS, RECURRENT 04/11/2008  . HYPERLIPIDEMIA 04/01/2007  . OVERACTIVE BLADDER 04/01/2007  . OSTEOPOROSIS 04/01/2007  . BREAST CANCER, HX OF 04/01/2007   Past Medical History  Diagnosis Date  . Cancer     Hx of breast CA  . Hyperlipidemia   . Osteoporosis    Past Surgical History  Procedure Laterality Date  . Mastectomy  03/2001    left  . Tonsillectomy    . Dilation and curettage of uterus    . Breast surgery  04/06/2001    mastectomy -  left side  . Eye surgery  2010    cataract - left   History  Substance Use Topics  . Smoking status: Never Smoker   . Smokeless tobacco: Never Used  . Alcohol Use: No   Family History  Problem Relation Age of Onset  . Heart disease Mother     CHF  . Hypertension Mother   . Stroke Father   . Hypertension Father   . Osteoporosis Father   . Heart disease Father     CHF  . Breast cancer Cousin    No Known Allergies Current Outpatient Prescriptions on File Prior to Visit  Medication Sig Dispense Refill  . aspirin 81 MG tablet Take 81 mg by mouth daily.        . Bimatoprost (LUMIGAN OP) Apply to eye.        . Calcium Carbonate-Vitamin D (CALCIUM 600 + D PO) Take 1 tablet by mouth 2 (two) times daily.       . cyanocobalamin 500 MCG  tablet Take 1,500 mcg by mouth daily.       . fish oil-omega-3 fatty acids 1000 MG capsule Take 2 g by mouth 2 (two) times daily.        . Multiple Vitamin (MULTIVITAMIN) capsule Take 1 capsule by mouth daily.         No current facility-administered medications on file prior to visit.    Review of Systems Review of Systems  Constitutional: Negative for fever, appetite change, fatigue and unexpected weight change.  Eyes: Negative for pain and visual disturbance.  Respiratory: Negative for cough and shortness of breath.   Cardiovascular: Negative for cp or palpitations    Gastrointestinal: Negative for nausea, diarrhea and constipation.  Genitourinary: Negative for urgency and frequency.  Skin: Negative for pallor or rash   Neurological: Negative for weakness, light-headedness, numbness and headaches.  Hematological: Negative for adenopathy. Does not bruise/bleed easily.  Psychiatric/Behavioral: Negative for dysphoric mood. The patient is not nervous/anxious.         Objective:   Physical Exam  Constitutional: She appears well-developed and well-nourished. No distress.  HENT:  Head: Normocephalic and atraumatic.  Right Ear: External ear normal.  Left Ear: External ear normal.  Nose: Nose normal.  Mouth/Throat: Oropharynx is clear and moist.  Eyes: Conjunctivae and EOM are normal. Pupils are equal, round, and reactive to light. Right eye exhibits no discharge. Left eye exhibits no discharge. No scleral icterus.  Neck: Normal range of motion. Neck supple. No JVD present. Carotid bruit is not present. No thyromegaly present.  Cardiovascular: Normal rate, regular rhythm, normal heart sounds and intact distal pulses.  Exam reveals no gallop.   Pulmonary/Chest: Effort normal and breath sounds normal. No respiratory distress. She has no wheezes.  Abdominal: Soft. Bowel sounds are normal. She exhibits no distension, no abdominal bruit and no mass. There is no tenderness.  Genitourinary:   Deferred breast exam today - she has exam upcoming with her surgeon   Musculoskeletal: She exhibits no edema and no tenderness.  Mild kyphosis   Lymphadenopathy:    She has no cervical adenopathy.  Neurological: She is alert. She has normal reflexes. No cranial nerve deficit. She exhibits normal muscle tone. Coordination normal.  Skin: Skin is warm and dry. No rash noted. No erythema. No pallor.  Psychiatric: She has a normal mood and affect.          Assessment & Plan:

## 2013-05-28 NOTE — Patient Instructions (Addendum)
You are due for a tetanus shot (Tdap )- [facility] the [facility] de[facility] since it is not covered by medicare Get your flu shot at the hospital as planned We will refer you for bone density at check out  Keep taking good care of yourself

## 2013-05-30 NOTE — Assessment & Plan Note (Signed)
Reviewed health habits including diet and exercise and skin cancer prevention Also reviewed health mt list, fam hx and immunizations  See HPI Labs reviewed  

## 2013-05-30 NOTE — Assessment & Plan Note (Signed)
Scheduled 2 year dexa today Disc imp of ca and D Rev safety/ fall prec

## 2013-05-30 NOTE — Assessment & Plan Note (Signed)
Disc goals for lipids and reasons to control them Rev labs with pt Rev low sat fat diet in detail   

## 2013-06-08 ENCOUNTER — Ambulatory Visit (INDEPENDENT_AMBULATORY_CARE_PROVIDER_SITE_OTHER)
Admission: RE | Admit: 2013-06-08 | Discharge: 2013-06-08 | Disposition: A | Discharge status: Home / Self Care | Payer: Medicare Other | Source: Ambulatory Visit | Attending: Family Medicine | Admitting: Family Medicine

## 2013-06-08 DIAGNOSIS — M810 Age-related osteoporosis without current pathological fracture: Secondary | ICD-10-CM

## 2013-06-15 ENCOUNTER — Encounter: Payer: Self-pay | Admitting: Family Medicine

## 2013-06-15 ENCOUNTER — Encounter: Payer: Self-pay | Admitting: *Deleted

## 2013-06-16 DIAGNOSIS — D2370 Other benign neoplasm of skin of unspecified lower limb, including hip: Secondary | ICD-10-CM | POA: Diagnosis not present

## 2013-06-16 DIAGNOSIS — M79609 Pain in unspecified limb: Secondary | ICD-10-CM | POA: Diagnosis not present

## 2013-06-29 ENCOUNTER — Ambulatory Visit: Payer: Medicare Other | Admitting: Family Medicine

## 2013-07-02 ENCOUNTER — Encounter: Payer: Self-pay | Admitting: Family Medicine

## 2013-07-02 ENCOUNTER — Ambulatory Visit (INDEPENDENT_AMBULATORY_CARE_PROVIDER_SITE_OTHER): Payer: Medicare Other | Admitting: Family Medicine

## 2013-07-02 VITALS — BP 142/78 | HR 66 | Temp 98.2°F | Ht 65.25 in | Wt 110.2 lb

## 2013-07-02 DIAGNOSIS — IMO0002 Reserved for concepts with insufficient information to code with codable children: Secondary | ICD-10-CM

## 2013-07-02 DIAGNOSIS — M8448XS Pathological fracture, other site, sequela: Secondary | ICD-10-CM

## 2013-07-02 DIAGNOSIS — M810 Age-related osteoporosis without current pathological fracture: Secondary | ICD-10-CM | POA: Diagnosis not present

## 2013-07-02 MED ORDER — RALOXIFENE HCL 60 MG PO TABS: 60.0000 mg | ORAL_TABLET | Freq: Every day | ORAL | Status: DC

## 2013-07-02 NOTE — Assessment & Plan Note (Addendum)
With osteoporosis  Pt has petite frame/ hx of b ca tx/ completed 5 y of fosamax/ hx of fx Trial of evista Info and px given Rev poss side eff dexa 2 y Rev ca andD and exercise and safety  Rev dexa with pt - dec in LS and FN T score (lowet -3.6)

## 2013-07-02 NOTE — Patient Instructions (Addendum)
Continue calcium and D and exercise  Fall precautions are important  Try evista 1 pill daily- inform me if any side effects  We will want to check another bone density test in 2 years

## 2013-07-02 NOTE — Progress Notes (Signed)
Subjective:    Patient ID: Kathy [name], female    DOB: [birth year], 72 y.o.   MRN: [id number]  HPI Here for f/u of OP  Hx of breast cancer and tx  bmi is 25- small build D level is 56 Completed 5 y of bisphosphenate  Recent dexa: OP Lowest T -3.6 LS  Hx of comp fx  Does exercise  Also working on balance in her stretch classes    No fractures lately  Broke arm (traumatic after a very hard fall)  Also had a vert fx L2 (whe was pulling a wheelchair up stairs and fell on her behind)   Patient Active Problem List   Diagnosis Date Noted  . Encounter for Medicare annual wellness exam 05/23/2013  . Vertigo 05/26/2012  . INTERMITTENT VERTIGO 06/26/2010  . COMPRESSION FRACTURE, L2 VERTEBRA 05/10/2009  . BOILS, RECURRENT 04/11/2008  . HYPERLIPIDEMIA 04/01/2007  . OVERACTIVE BLADDER 04/01/2007  . OSTEOPOROSIS 04/01/2007  . BREAST CANCER, HX OF 04/01/2007   Past Medical History  Diagnosis Date  . Cancer     Hx of breast CA  . Hyperlipidemia   . Osteoporosis    Past Surgical History  Procedure Laterality Date  . Mastectomy  03/2001    left  . Tonsillectomy    . Dilation and curettage of uterus    . Breast surgery  04/06/2001    mastectomy - left side  . Eye surgery  2010    cataract - left   History  Substance Use Topics  . Smoking status: Never Smoker   . Smokeless tobacco: Never Used  . Alcohol Use: No   Family History  Problem Relation Age of Onset  . Heart disease Mother     CHF  . Hypertension Mother   . Stroke Father   . Hypertension Father   . Osteoporosis Father   . Heart disease Father     CHF  . Breast cancer Cousin    No Known Allergies Current Outpatient Prescriptions on File Prior to Visit  Medication Sig Dispense Refill  . aspirin 81 MG tablet Take 81 mg by mouth daily.        . Bimatoprost (LUMIGAN OP) Apply to eye.        . Calcium Carbonate-Vitamin D (CALCIUM 600 + D PO) Take 1 tablet by mouth 2 (two) times daily.       . Cholecalciferol  (D3 ADULT PO) Take 2,000 Units by mouth daily.      . COMBIGAN 0.2-0.5 % ophthalmic solution Place 1 drop into both eyes 2 (two) times daily.      . cyanocobalamin 500 MCG tablet Take 1,500 mcg by mouth daily.       . fish oil-omega-3 fatty acids 1000 MG capsule Take 1 g by mouth daily.       . Multiple Vitamin (MULTIVITAMIN) capsule Take 1 capsule by mouth daily.         No current facility-administered medications on file prior to visit.      Review of Systems Review of Systems  Constitutional: Negative for fever, appetite change, fatigue and unexpected weight change.  Eyes: Negative for pain and visual disturbance.  Respiratory: Negative for cough and shortness of breath.   Cardiovascular: Negative for cp or palpitations    Gastrointestinal: Negative for nausea, diarrhea and constipation.  Genitourinary: Negative for urgency and frequency.  Skin: Negative for pallor or rash   MSK neg for back pain or inability to straighten spine  Neurological: Negative  for weakness, light-headedness, numbness and headaches.  Hematological: Negative for adenopathy. Does not bruise/bleed easily.  Psychiatric/Behavioral: Negative for dysphoric mood. The patient is not nervous/anxious.         Objective:   Physical Exam  Constitutional: She appears well-developed and well-nourished. No distress.  Slim and well appearing  HENT:  Head: Normocephalic and atraumatic.  Eyes: Conjunctivae and EOM are normal. Pupils are equal, round, and reactive to light.  Neck: Normal range of motion. Neck supple.  Musculoskeletal: She exhibits no edema.  Slight kyphosis  Very petite frame  Neurological: She is alert.  Skin: Skin is warm and dry. No rash noted. No erythema. No pallor.  Psychiatric: She has a normal mood and affect.          Assessment & Plan:

## 2013-07-04 NOTE — Assessment & Plan Note (Signed)
See assessment for comp fx  Will try evista  Pt is former breast cancer pt

## 2013-07-13 ENCOUNTER — Ambulatory Visit (INDEPENDENT_AMBULATORY_CARE_PROVIDER_SITE_OTHER): Payer: Medicare Other | Admitting: General Surgery

## 2013-07-14 ENCOUNTER — Ambulatory Visit (AMBULATORY_SURGERY_CENTER): Payer: Medicare Other | Admitting: *Deleted

## 2013-07-14 VITALS — Ht 65.25 in | Wt 110.0 lb

## 2013-07-14 DIAGNOSIS — Z1211 Encounter for screening for malignant neoplasm of colon: Secondary | ICD-10-CM

## 2013-07-14 MED ORDER — MOVIPREP 100 G PO SOLR: ORAL | Status: DC

## 2013-07-14 NOTE — Progress Notes (Signed)
Patient denies any allergies to eggs or soy. Patient denies problems with anesthesia.

## 2013-07-16 ENCOUNTER — Ambulatory Visit (INDEPENDENT_AMBULATORY_CARE_PROVIDER_SITE_OTHER): Payer: Medicare Other | Admitting: General Surgery

## 2013-07-21 ENCOUNTER — Ambulatory Visit (INDEPENDENT_AMBULATORY_CARE_PROVIDER_SITE_OTHER): Payer: Medicare Other | Admitting: General Surgery

## 2013-07-28 ENCOUNTER — Ambulatory Visit (AMBULATORY_SURGERY_CENTER): Payer: Medicare Other | Admitting: Internal Medicine

## 2013-07-28 ENCOUNTER — Encounter: Payer: Self-pay | Admitting: Internal Medicine

## 2013-07-28 VITALS — BP 144/66 | HR 57 | Temp 96.9°F | Resp 15 | Ht 65.25 in | Wt 110.0 lb

## 2013-07-28 DIAGNOSIS — Z1211 Encounter for screening for malignant neoplasm of colon: Secondary | ICD-10-CM | POA: Diagnosis not present

## 2013-07-28 DIAGNOSIS — D126 Benign neoplasm of colon, unspecified: Secondary | ICD-10-CM

## 2013-07-28 MED ORDER — SODIUM CHLORIDE 0.9 % IV SOLN: 500.0000 mL | INTRAVENOUS | Status: DC

## 2013-07-28 NOTE — Progress Notes (Signed)
Called to room to assist during endoscopic procedure.  Patient ID and intended procedure confirmed with present staff. Received instructions for my participation in the procedure from the performing physician. ewm 

## 2013-07-28 NOTE — Progress Notes (Signed)
Patient did not experience any of the following events: a burn prior to discharge; a fall within the facility; wrong site/side/patient/procedure/implant event; or a hospital transfer or hospital admission upon discharge from the facility. (G8907) Patient did not have preoperative order for IV antibiotic SSI prophylaxis. (G8918)  

## 2013-07-28 NOTE — Op Note (Signed)
[facility] [address] [place], [address]   COLONOSCOPY PROCEDURE REPORT  PATIENT: Unknown, Flannigan  MR#: [id number] BIRTHDATE: 10-Mar-1941 , 72  yrs. old GENDER: Female ENDOSCOPIST: Hart Carwin, MD REFERRED HQ:IONGE Fransisca Connors, M.D. PROCEDURE DATE:  07/28/2013 PROCEDURE:   Colonoscopy with cold biopsy polypectomy First Screening Colonoscopy - Avg.  risk and is 50 yrs.  old or older - No.  Prior Negative Screening - Now for repeat screening. 10 or more years since last screening  History of Adenoma - Now for follow-up colonoscopy & has been > or = to 3 yrs.  N/A  Polyps Removed Today? Yes. ASA CLASS:   Class II INDICATIONS:Average risk patient for colon cancer. MEDICATIONS: MAC sedation, administered by CRNA and propofol (Diprivan) 200mg  IV  DESCRIPTION OF PROCEDURE:   After the risks benefits and alternatives of the procedure were thoroughly explained, informed consent was obtained.  A digital rectal exam revealed no abnormalities of the rectum.   The LB [id number]  endoscope was introduced through the anus and advanced to the cecum, which was identified by both the appendix and ileocecal valve. No adverse events experienced.   The quality of the prep was excellent, using MoviPrep  The instrument was then slowly withdrawn as the colon was fully examined.      COLON FINDINGS: A diminutive sessile polyp was found in the sigmoid colon.  A polypectomy was performed with cold forceps.  The resection was complete and the polyp tissue was completely retrieved.  Retroflexed views revealed no abnormalities. The time to cecum=6 minutes 27 seconds.  Withdrawal time=6 minutes 2 seconds.  The scope was withdrawn and the procedure completed. COMPLICATIONS: There were no complications.  ENDOSCOPIC IMPRESSION: Diminutive sessile polyp was found in the sigmoid colon; polypectomy was performed with cold forceps  RECOMMENDATIONS: 1.  Await pathology results 2.   High fiber diet 3.   recall colonoscopy in 10 years   eSigned:  Hart Carwin, MD 07/28/2013 10:03 AM   cc:

## 2013-07-28 NOTE — Patient Instructions (Signed)

## 2013-07-29 ENCOUNTER — Telehealth: Payer: Self-pay | Admitting: *Deleted

## 2013-07-29 NOTE — Telephone Encounter (Signed)
  Follow up Call-  Call back number 07/28/2013  Post procedure Call Back phone  # [phone]  Permission to leave phone message Yes     Patient questions:  Do you have a fever, pain , or abdominal swelling? no Pain Score  0 *  Have you tolerated food without any problems? yes  Have you been able to return to your normal activities? yes  Do you have any questions about your discharge instructions: Diet   no Medications  no Follow up visit  no  Do you have questions or concerns about your Care? no  Actions: * If pain score is 4 or above: No action needed, pain <4.

## 2013-08-02 ENCOUNTER — Encounter (INDEPENDENT_AMBULATORY_CARE_PROVIDER_SITE_OTHER): Payer: Self-pay | Admitting: General Surgery

## 2013-08-02 ENCOUNTER — Ambulatory Visit (INDEPENDENT_AMBULATORY_CARE_PROVIDER_SITE_OTHER): Payer: Medicare Other | Admitting: General Surgery

## 2013-08-02 VITALS — BP 105/58 | HR 72 | Resp 16 | Ht 66.0 in | Wt 109.6 lb

## 2013-08-02 DIAGNOSIS — Z853 Personal history of malignant neoplasm of breast: Secondary | ICD-10-CM | POA: Diagnosis not present

## 2013-08-02 NOTE — Patient Instructions (Signed)
Continue regular self exams  

## 2013-08-02 NOTE — Progress Notes (Signed)
Subjective:     Patient ID: Kathy [name], female   DOB: December 22, 1940, 72 y.o.   MRN: [id number]  HPI The patient is a 72 year old white female who is 11 years status post left modified radical mastectomy for a T2 N0 left breast cancer. Her last mammogram was in May that showed no evidence of malignancy. She denies any chest wall pain. She denies any right breast pain. She denies any discharge or nipple. She has been well for the last year and has had no major medical problem  Review of Systems  Constitutional: Negative.   HENT: Negative.   Eyes: Negative.   Respiratory: Negative.   Cardiovascular: Negative.   Gastrointestinal: Negative.   Endocrine: Negative.   Genitourinary: Negative.   Musculoskeletal: Negative.   Skin: Negative.   Allergic/Immunologic: Negative.   Neurological: Negative.   Hematological: Negative.   Psychiatric/Behavioral: Negative.        Objective:   Physical Exam  Constitutional: She is oriented to person, place, and time. She appears well-developed and well-nourished.  HENT:  Head: Normocephalic and atraumatic.  Eyes: Conjunctivae and EOM are normal. Pupils are equal, round, and reactive to light.  Neck: Normal range of motion. Neck supple.  Cardiovascular: Normal rate, regular rhythm and normal heart sounds.   Pulmonary/Chest: Effort normal and breath sounds normal.  There is no palpable mass of the left chest wall. There is no palpable mass in the right breast. There is no palpable axillary, supraclavicular, or cervical lymphadenopathy  Abdominal: Soft. Bowel sounds are normal. She exhibits no mass. There is no tenderness.  Musculoskeletal: Normal range of motion.  Lymphadenopathy:    She has no cervical adenopathy.  Neurological: She is alert and oriented to person, place, and time.  Skin: Skin is warm and dry.  Psychiatric: She has a normal mood and affect. Her behavior is normal.       Assessment:     The patient is 11 years status post left  modified radical mastectomy for breast cancer     Plan:     At this point she will continue to do regular self exams. She has been started on raloxifene to improve her bone density. She will take this every day. We will plan to see her back in one year.

## 2013-08-03 ENCOUNTER — Encounter: Payer: Self-pay | Admitting: Internal Medicine

## 2013-09-01 ENCOUNTER — Other Ambulatory Visit: Payer: Self-pay | Admitting: *Deleted

## 2013-09-01 MED ORDER — RALOXIFENE HCL 60 MG PO TABS: 60.0000 mg | ORAL_TABLET | Freq: Every day | ORAL | Status: DC

## 2013-09-06 DIAGNOSIS — H4010X0 Unspecified open-angle glaucoma, stage unspecified: Secondary | ICD-10-CM | POA: Diagnosis not present

## 2014-01-18 ENCOUNTER — Other Ambulatory Visit: Payer: Self-pay

## 2014-01-18 DIAGNOSIS — Z1231 Encounter for screening mammogram for malignant neoplasm of breast: Secondary | ICD-10-CM

## 2014-01-18 DIAGNOSIS — Z9012 Acquired absence of left breast and nipple: Secondary | ICD-10-CM

## 2014-02-24 ENCOUNTER — Ambulatory Visit: Payer: Medicare Other

## 2014-03-01 ENCOUNTER — Ambulatory Visit
Admission: RE | Admit: 2014-03-01 | Discharge: 2014-03-01 | Disposition: A | Discharge status: Home / Self Care | Payer: Medicare Other | Source: Ambulatory Visit

## 2014-03-01 DIAGNOSIS — Z1231 Encounter for screening mammogram for malignant neoplasm of breast: Secondary | ICD-10-CM

## 2014-03-01 DIAGNOSIS — Z9012 Acquired absence of left breast and nipple: Secondary | ICD-10-CM

## 2014-03-14 DIAGNOSIS — H4010X0 Unspecified open-angle glaucoma, stage unspecified: Secondary | ICD-10-CM | POA: Diagnosis not present

## 2014-03-31 ENCOUNTER — Other Ambulatory Visit: Payer: Self-pay

## 2014-03-31 MED ORDER — RALOXIFENE HCL 60 MG PO TABS: 60.0000 mg | ORAL_TABLET | Freq: Every day | ORAL | Status: DC

## 2014-03-31 NOTE — Telephone Encounter (Signed)
Pt left v/m requesting refill r[facility] if Dr Glori Bickers wants pt to continue taking raloxifene.Please advise.pt request cb.

## 2014-04-15 DIAGNOSIS — H4010X0 Unspecified open-angle glaucoma, stage unspecified: Secondary | ICD-10-CM | POA: Diagnosis not present

## 2014-05-22 ENCOUNTER — Telehealth: Payer: Self-pay | Admitting: Family Medicine

## 2014-05-22 DIAGNOSIS — E785 Hyperlipidemia, unspecified: Secondary | ICD-10-CM

## 2014-05-22 DIAGNOSIS — Z0000 Encounter for general adult medical examination without abnormal findings: Secondary | ICD-10-CM

## 2014-05-22 DIAGNOSIS — M810 Age-related osteoporosis without current pathological fracture: Secondary | ICD-10-CM

## 2014-05-22 NOTE — Telephone Encounter (Signed)
Message copied by Abner Greenspan on Sun May 22, 2014  2:02 PM ------      Message from: Ellamae Sia      Created: Tue May 17, 2014  3:19 PM      Regarding: Lab orders for Monday, 8.24.15       Patient is scheduled for CPX labs, please order future labs, Thanks , Terri       ------

## 2014-05-23 ENCOUNTER — Other Ambulatory Visit (INDEPENDENT_AMBULATORY_CARE_PROVIDER_SITE_OTHER): Payer: Medicare Other

## 2014-05-23 DIAGNOSIS — M810 Age-related osteoporosis without current pathological fracture: Secondary | ICD-10-CM | POA: Diagnosis not present

## 2014-05-23 DIAGNOSIS — Z0000 Encounter for general adult medical examination without abnormal findings: Secondary | ICD-10-CM

## 2014-05-23 DIAGNOSIS — E785 Hyperlipidemia, unspecified: Secondary | ICD-10-CM | POA: Diagnosis not present

## 2014-05-23 LAB — CBC WITH DIFFERENTIAL/PLATELET
Basophils Absolute: 0 10*3/uL (ref 0.0–0.1)
Basophils Relative: 0.6 % (ref 0.0–3.0)
EOS PCT: 4.4 % (ref 0.0–5.0)
Eosinophils Absolute: 0.2 10*3/uL (ref 0.0–0.7)
HEMATOCRIT: 40.7 % (ref 36.0–46.0)
HEMOGLOBIN: 14 g/dL (ref 12.0–15.0)
LYMPHS ABS: 1.3 10*3/uL (ref 0.7–4.0)
Lymphocytes Relative: 31.6 % (ref 12.0–46.0)
MCHC: 34.3 g/dL (ref 30.0–36.0)
MCV: 94.9 fl (ref 78.0–100.0)
MONOS PCT: 7.4 % (ref 3.0–12.0)
Monocytes Absolute: 0.3 10*3/uL (ref 0.1–1.0)
NEUTROS ABS: 2.3 10*3/uL (ref 1.4–7.7)
Neutrophils Relative %: 56 % (ref 43.0–77.0)
PLATELETS: 143 10*3/uL — AB (ref 150.0–400.0)
RBC: 4.29 Mil/uL (ref 3.87–5.11)
RDW: 13.8 % (ref 11.5–15.5)
WBC: 4.2 10*3/uL (ref 4.0–10.5)

## 2014-05-23 LAB — COMPREHENSIVE METABOLIC PANEL
ALT: 16 U/L (ref 0–35)
AST: 22 U/L (ref 0–37)
Albumin: 3.8 g/dL (ref 3.5–5.2)
Alkaline Phosphatase: 48 U/L (ref 39–117)
BILIRUBIN TOTAL: 0.6 mg/dL (ref 0.2–1.2)
BUN: 13 mg/dL (ref 6–23)
CO2: 30 meq/L (ref 19–32)
Calcium: 9 mg/dL (ref 8.4–10.5)
Chloride: 106 mEq/L (ref 96–112)
Creatinine, Ser: 0.6 mg/dL (ref 0.4–1.2)
GFR: 101.88 mL/min (ref >60.00)
GLUCOSE: 83 mg/dL (ref 70–99)
Potassium: 4 mEq/L (ref 3.5–5.1)
SODIUM: 141 meq/L (ref 135–145)
TOTAL PROTEIN: 6.5 g/dL (ref 6.0–8.3)

## 2014-05-23 LAB — LIPID PANEL
CHOLESTEROL: 191 mg/dL (ref 0–200)
HDL: 86.9 mg/dL (ref >39.00)
LDL Cholesterol: 92 mg/dL (ref 0–99)
NonHDL: 104.1
Total CHOL/HDL Ratio: 2
Triglycerides: 60 mg/dL (ref 0.0–149.0)
VLDL: 12 mg/dL (ref 0.0–40.0)

## 2014-05-23 LAB — TSH: TSH: 1.95 u[IU]/mL (ref 0.35–4.50)

## 2014-05-23 LAB — VITAMIN D 25 HYDROXY (VIT D DEFICIENCY, FRACTURES): VITD: 41.89 ng/mL (ref 30.00–100.00)

## 2014-05-30 ENCOUNTER — Ambulatory Visit (INDEPENDENT_AMBULATORY_CARE_PROVIDER_SITE_OTHER): Payer: Medicare Other | Admitting: Family Medicine

## 2014-05-30 ENCOUNTER — Encounter: Payer: Self-pay | Admitting: Family Medicine

## 2014-05-30 VITALS — BP 112/78 | HR 67 | Temp 98.1°F | Ht 65.25 in | Wt 109.5 lb

## 2014-05-30 DIAGNOSIS — H6120 Impacted cerumen, unspecified ear: Secondary | ICD-10-CM | POA: Insufficient documentation

## 2014-05-30 DIAGNOSIS — Z0000 Encounter for general adult medical examination without abnormal findings: Secondary | ICD-10-CM

## 2014-05-30 DIAGNOSIS — E785 Hyperlipidemia, unspecified: Secondary | ICD-10-CM | POA: Diagnosis not present

## 2014-05-30 DIAGNOSIS — Z23 Encounter for immunization: Secondary | ICD-10-CM | POA: Diagnosis not present

## 2014-05-30 DIAGNOSIS — H6121 Impacted cerumen, right ear: Secondary | ICD-10-CM

## 2014-05-30 DIAGNOSIS — M810 Age-related osteoporosis without current pathological fracture: Secondary | ICD-10-CM | POA: Diagnosis not present

## 2014-05-30 MED ORDER — RALOXIFENE HCL 60 MG PO TABS
60.0000 mg | ORAL_TABLET | Freq: Every day | ORAL | Status: DC
Start: 2014-05-30 — End: 2014-10-10

## 2014-05-30 NOTE — Assessment & Plan Note (Signed)
Great profile with high HDL  Commended good habits  Disc goals for lipids and reasons to control them Rev labs with pt Rev low sat fat diet in detail

## 2014-05-30 NOTE — Assessment & Plan Note (Signed)
dexa 9/14 On evista No falls or fx Vitamin D level is therapeutic with current supplementation Disc importance of this to bone and overall health  Disc need for calcium/ vitamin D/ wt bearing exercise and bone density test every 2 y to monitor Disc safety/ fracture risk in detail

## 2014-05-30 NOTE — Progress Notes (Signed)
Subjective:    Patient ID: Kathy [name], female    DOB: 1940/10/16, 73 y.o.   MRN: [id number]  HPI I have personally reviewed the Medicare Annual Wellness questionnaire and have noted 1. The patient's medical and social history 2. Their use of alcohol, tobacco or illicit drugs 3. Their current medications and supplements 4. The patient's functional ability including ADL's, fall risks, home safety risks and hearing or visual             impairment. 5. Diet and physical activities 6. Evidence for depression or mood disorders  The patients weight, height, BMI have been recorded in the chart and visual acuity is per eye clinic.  I have made referrals, counseling and provided education to the patient based review of the above and I have provided the pt with a written personalized care plan for preventive services.  Had a good summer    See scanned forms.  Routine anticipatory guidance given to patient.  See health maintenance. Colon cancer screening 10/14  Breast cancer screening 6/15 no problems  Self breast exam no lumps  Flu vaccine - got that today  Tetanus vaccine 9/14  Pneumovax 8/09 - will get prevnar today  Zoster vaccine 7/07   Advance directive - has that written up / plans to update it  Cognitive function addressed- see scanned forms- and if abnormal then additional documentation follows. No worries about memory   Hearing is not as good Always had a bad right ear   PMH and SH reviewed  Meds, vitals, and allergies reviewed.   ROS: See HPI.  Otherwise negative.    dexa 9/14  OP Is on evista -no problems  Vit D is 41.8  No falls or fractures in the past year  Goes to exercise class 3 days per week   Hyperlipidemia  Lab Results  Component Value Date   CHOL 191 05/23/2014   CHOL 209* 05/24/2013   CHOL 204* 05/19/2012   Lab Results  Component Value Date   HDL 86.90 05/23/2014   HDL 86.90 05/24/2013   HDL 90.50 05/19/2012   Lab Results  Component Value Date     LDLCALC 92 05/23/2014   LDLCALC 98 05/11/2010   [place] 99 05/10/2009   Lab Results  Component Value Date   TRIG 60.0 05/23/2014   TRIG 67.0 05/24/2013   TRIG 73.0 05/19/2012   Lab Results  Component Value Date   CHOLHDL 2 05/23/2014   CHOLHDL 2 05/24/2013   CHOLHDL 2 05/19/2012   Lab Results  Component Value Date   LDLDIRECT 107.3 05/24/2013   LDLDIRECT 99.8 05/19/2012   LDLDIRECT 101.6 05/17/2011   excellent cholesterol profile  She eats very healthy most of the time   Stable cbc Lab Results  Component Value Date   WBC 4.2 05/23/2014   HGB 14.0 05/23/2014   HCT 40.7 05/23/2014   MCV 94.9 05/23/2014   PLT 143.0* 05/23/2014   no excess bleeding or bruising   Patient Active Problem List   Diagnosis Date Noted  . Cerumen impaction 05/30/2014  . Encounter for Medicare annual wellness exam 05/23/2013  . Vertigo 05/26/2012  . INTERMITTENT VERTIGO 06/26/2010  . COMPRESSION FRACTURE, L2 VERTEBRA 05/10/2009  . BOILS, RECURRENT 04/11/2008  . Screening for lipoid disorders 04/01/2007  . OVERACTIVE BLADDER 04/01/2007  . OSTEOPOROSIS 04/01/2007  . BREAST CANCER, HX OF 04/01/2007   Past Medical History  Diagnosis Date  . Cancer     Hx of breast CA  .  Hyperlipidemia   . Osteoporosis   . Glaucoma    Past Surgical History  Procedure Laterality Date  . Mastectomy  03/2001    left  . Tonsillectomy    . Dilation and curettage of uterus    . Breast surgery  04/06/2001    mastectomy - left side  . Eye surgery  2010    cataract - left   History  Substance Use Topics  . Smoking status: Never Smoker   . Smokeless tobacco: Never Used  . Alcohol Use: No   Family History  Problem Relation Age of Onset  . Heart disease Mother     CHF  . Hypertension Mother   . Stroke Father   . Hypertension Father   . Osteoporosis Father   . Heart disease Father     CHF  . Breast cancer Cousin   . Colon cancer Neg Hx    No Known Allergies Current Outpatient Prescriptions on File Prior to  Visit  Medication Sig Dispense Refill  . aspirin 81 MG tablet Take 81 mg by mouth daily.        . fish oil-omega-3 fatty acids 1000 MG capsule Take 1 g by mouth daily.       . Multiple Vitamin (MULTIVITAMIN) capsule Take 1 capsule by mouth daily.         No current facility-administered medications on file prior to visit.    Review of Systems Review of Systems  Constitutional: Negative for fever, appetite change, fatigue and unexpected weight change.  Eyes: Negative for pain and visual disturbance.  Respiratory: Negative for cough and shortness of breath.   Cardiovascular: Negative for cp or palpitations    Gastrointestinal: Negative for nausea, diarrhea and constipation.  Genitourinary: Negative for urgency and frequency.  Skin: Negative for pallor or rash   Neurological: Negative for weakness, light-headedness, numbness and headaches.  Hematological: Negative for adenopathy. Does not bruise/bleed easily.  Psychiatric/Behavioral: Negative for dysphoric mood. The patient is not nervous/anxious.         Objective:   Physical Exam  Constitutional: She appears well-developed and well-nourished. No distress.  HENT:  Head: Normocephalic and atraumatic.  Right Ear: External ear normal.  Left Ear: External ear normal.  Mouth/Throat: Oropharynx is clear and moist.  Eyes: Conjunctivae and EOM are normal. Pupils are equal, round, and reactive to light. No scleral icterus.  Neck: Normal range of motion. Neck supple. No JVD present. Carotid bruit is not present. No thyromegaly present.  Cardiovascular: Normal rate, regular rhythm, normal heart sounds and intact distal pulses.  Exam reveals no gallop.   Pulmonary/Chest: Effort normal and breath sounds normal. No respiratory distress. She has no wheezes. She exhibits no tenderness.  Abdominal: Soft. Bowel sounds are normal. She exhibits no distension, no abdominal bruit and no mass. There is no tenderness.  Genitourinary:  mastecomy site on L  is clear  Breast exam R: No mass, nodules, thickening, tenderness, bulging, retraction, inflamation, nipple discharge or skin changes noted.  No axillary or clavicular LA.      Musculoskeletal: Normal range of motion. She exhibits no edema and no tenderness.  Lymphadenopathy:    She has no cervical adenopathy.  Neurological: She is alert. She has normal reflexes. No cranial nerve deficit. She exhibits normal muscle tone. Coordination normal.  Skin: Skin is warm and dry. No rash noted. No erythema. No pallor.  Psychiatric: She has a normal mood and affect.          Assessment &  Plan:   Problem List Items Addressed This Visit     Nervous and Auditory   Cerumen impaction     Small amt in R ear only inst to use debrox otc and let us know if no imp  Has bilat hearing loss- she will get that checked out further when ready      Musculoskeletal and Integument   OSTEOPOROSIS     dexa 9/14 On evista No falls or fx Vitamin D level is therapeutic with current supplementation Disc importance of this to bone and overall health  Disc need for calcium/ vitamin D/ wt bearing exercise and bone density test every 2 y to monitor Disc safety/ fracture risk in detail      Relevant Medications      Calcium Carbonate-Vitamin D (CALCIUM-VITAMIN D3) 600-125 MG-UNIT TABS      raloxifene (EVISTA) 60 MG tablet     Other   Screening for lipoid disorders     Great profile with high HDL  Commended good habits  Disc goals for lipids and reasons to control them Rev labs with pt Rev low sat fat diet in detail     Encounter for Medicare annual wellness exam - Primary     Reviewed health habits including diet and exercise and skin cancer prevention Reviewed appropriate screening tests for age  Also reviewed health mt list, fam hx and immunization status , as well as social and family history   Labs rev See HPI She plans to update her adv directive  Flu and prevnar vaccine today     Other Visit  Diagnoses   Need for prophylactic vaccination and inoculation against influenza        Relevant Orders       Flu Vaccine QUAD 36+ mos PF IM (Fluarix Quad PF) (Completed)    Need for vaccination with 13-polyvalent pneumococcal conjugate vaccine        Relevant Orders       Pneumococcal conjugate vaccine 13-valent (Completed)

## 2014-05-30 NOTE — Assessment & Plan Note (Signed)
Small amt in R ear only inst to use debrox otc and let us know if no imp  Has bilat hearing loss- she will get that checked out further when ready

## 2014-05-30 NOTE — Patient Instructions (Signed)
You have a small amount of wax in your right ear canal  Get a product otc called debrox -use as directed once a week - to loosen it up  Flu vaccine today prevnar vaccine today Keep up your good health habits  Stay active

## 2014-05-30 NOTE — Progress Notes (Signed)
Pre visit review using our clinic review tool, if applicable. No additional management support is needed unless otherwise documented below in the visit note. 

## 2014-05-30 NOTE — Assessment & Plan Note (Signed)
Reviewed health habits including diet and exercise and skin cancer prevention Reviewed appropriate screening tests for age  Also reviewed health mt list, fam hx and immunization status , as well as social and family history   Labs rev See HPI She plans to update her adv directive  Flu and prevnar vaccine today

## 2014-06-09 ENCOUNTER — Telehealth: Payer: Self-pay

## 2014-06-09 NOTE — Telephone Encounter (Signed)
Raloxifene is associated with a reduced risk of breast cancer -which is good.  I can try to print out some info for her when I get back in the office tomorrow if she wants

## 2014-06-09 NOTE — Telephone Encounter (Signed)
Pt received literature with raloxifene re; to pt who have hx of breast cancer; pt wanted to confirm she should be taking raloxifene.Please advise. Pt not having any known problems.

## 2014-06-13 NOTE — Telephone Encounter (Signed)
Pt notified of Dr. Marliss Coots comments/recommendations. Pt said she will take the Raloxifene and she doesn't need any info on Rx because she read the info that came with the Rx

## 2014-08-18 ENCOUNTER — Telehealth: Payer: Self-pay

## 2014-08-18 NOTE — Telephone Encounter (Signed)
Pt left v/m requesting cb about medical record question. Called pt back, no answer and no v/m. Will try again later.

## 2014-10-10 ENCOUNTER — Other Ambulatory Visit: Payer: Self-pay

## 2014-10-10 NOTE — Telephone Encounter (Signed)
Pt has changed pharmacy to [facility] [address]pt no longer uses mail order pharmacy. Pt request 30 day rx of evista to [facility]. Pt wants to verify she is to continue taking the Evista.pt request cb.

## 2014-10-10 NOTE — Telephone Encounter (Signed)
Yes-continue evista  Can send 30 with 11 refills

## 2014-10-11 MED ORDER — RALOXIFENE HCL 60 MG PO TABS: 60.0000 mg | ORAL_TABLET | Freq: Every day | ORAL | Status: DC

## 2014-10-11 NOTE — Telephone Encounter (Signed)
Rx sent to pharmacy and pt notified to continue taking Rx

## 2014-10-13 DIAGNOSIS — H4011X1 Primary open-angle glaucoma, mild stage: Secondary | ICD-10-CM | POA: Diagnosis not present

## 2014-10-18 DIAGNOSIS — M79672 Pain in left foot: Secondary | ICD-10-CM | POA: Diagnosis not present

## 2014-10-18 DIAGNOSIS — M79671 Pain in right foot: Secondary | ICD-10-CM | POA: Diagnosis not present

## 2014-10-18 DIAGNOSIS — D2371 Other benign neoplasm of skin of right lower limb, including hip: Secondary | ICD-10-CM | POA: Diagnosis not present

## 2014-10-18 DIAGNOSIS — D2372 Other benign neoplasm of skin of left lower limb, including hip: Secondary | ICD-10-CM | POA: Diagnosis not present

## 2014-11-08 DIAGNOSIS — D2371 Other benign neoplasm of skin of right lower limb, including hip: Secondary | ICD-10-CM | POA: Diagnosis not present

## 2014-11-08 DIAGNOSIS — M79671 Pain in right foot: Secondary | ICD-10-CM | POA: Diagnosis not present

## 2014-11-08 DIAGNOSIS — M79672 Pain in left foot: Secondary | ICD-10-CM | POA: Diagnosis not present

## 2014-11-15 DIAGNOSIS — H4011X1 Primary open-angle glaucoma, mild stage: Secondary | ICD-10-CM | POA: Diagnosis not present

## 2014-12-13 DIAGNOSIS — H4011X1 Primary open-angle glaucoma, mild stage: Secondary | ICD-10-CM | POA: Diagnosis not present

## 2015-02-02 ENCOUNTER — Other Ambulatory Visit: Payer: Self-pay

## 2015-02-02 DIAGNOSIS — Z1231 Encounter for screening mammogram for malignant neoplasm of breast: Secondary | ICD-10-CM

## 2015-03-06 ENCOUNTER — Ambulatory Visit
Admission: RE | Admit: 2015-03-06 | Discharge: 2015-03-06 | Disposition: A | Discharge status: Home / Self Care | Payer: Medicare Other | Source: Ambulatory Visit

## 2015-03-06 DIAGNOSIS — Z1231 Encounter for screening mammogram for malignant neoplasm of breast: Secondary | ICD-10-CM | POA: Diagnosis not present

## 2015-04-11 DIAGNOSIS — H4011X1 Primary open-angle glaucoma, mild stage: Secondary | ICD-10-CM | POA: Diagnosis not present

## 2015-05-11 DIAGNOSIS — L821 Other seborrheic keratosis: Secondary | ICD-10-CM | POA: Diagnosis not present

## 2015-05-11 DIAGNOSIS — Z85828 Personal history of other malignant neoplasm of skin: Secondary | ICD-10-CM | POA: Diagnosis not present

## 2015-05-11 DIAGNOSIS — D225 Melanocytic nevi of trunk: Secondary | ICD-10-CM | POA: Diagnosis not present

## 2015-05-11 DIAGNOSIS — X32XXXA Exposure to sunlight, initial encounter: Secondary | ICD-10-CM | POA: Diagnosis not present

## 2015-05-11 DIAGNOSIS — L570 Actinic keratosis: Secondary | ICD-10-CM | POA: Diagnosis not present

## 2015-06-05 ENCOUNTER — Telehealth: Payer: Self-pay | Admitting: Family Medicine

## 2015-06-05 DIAGNOSIS — Z0000 Encounter for general adult medical examination without abnormal findings: Secondary | ICD-10-CM | POA: Insufficient documentation

## 2015-06-05 NOTE — Telephone Encounter (Signed)
-----   Message from Ellamae Sia sent at 05/30/2015  2:57 PM EDT ----- Regarding: Lab orders for Tuesday, 9.6.16 Patient is scheduled for CPX labs, please order future labs, Thanks , Karna Christmas

## 2015-06-06 ENCOUNTER — Other Ambulatory Visit (INDEPENDENT_AMBULATORY_CARE_PROVIDER_SITE_OTHER): Payer: Medicare Other

## 2015-06-06 DIAGNOSIS — Z0000 Encounter for general adult medical examination without abnormal findings: Secondary | ICD-10-CM | POA: Diagnosis not present

## 2015-06-06 LAB — COMPREHENSIVE METABOLIC PANEL
ALT: 10 U/L (ref 0–35)
AST: 15 U/L (ref 0–37)
Albumin: 3.9 g/dL (ref 3.5–5.2)
Alkaline Phosphatase: 42 U/L (ref 39–117)
BILIRUBIN TOTAL: 0.6 mg/dL (ref 0.2–1.2)
BUN: 19 mg/dL (ref 6–23)
CHLORIDE: 109 meq/L (ref 96–112)
CO2: 31 meq/L (ref 19–32)
CREATININE: 0.58 mg/dL (ref 0.40–1.20)
Calcium: 9 mg/dL (ref 8.4–10.5)
GFR: 107.69 mL/min (ref >60.00)
GLUCOSE: 84 mg/dL (ref 70–99)
Potassium: 4.2 mEq/L (ref 3.5–5.1)
Sodium: 145 mEq/L (ref 135–145)
Total Protein: 5.9 g/dL — ABNORMAL LOW (ref 6.0–8.3)

## 2015-06-06 LAB — CBC WITH DIFFERENTIAL/PLATELET
BASOS ABS: 0 10*3/uL (ref 0.0–0.1)
BASOS PCT: 0.6 % (ref 0.0–3.0)
EOS ABS: 0.4 10*3/uL (ref 0.0–0.7)
Eosinophils Relative: 8.4 % — ABNORMAL HIGH (ref 0.0–5.0)
HCT: 40.4 % (ref 36.0–46.0)
Hemoglobin: 13.5 g/dL (ref 12.0–15.0)
LYMPHS ABS: 1.4 10*3/uL (ref 0.7–4.0)
Lymphocytes Relative: 29.9 % (ref 12.0–46.0)
MCHC: 33.3 g/dL (ref 30.0–36.0)
MCV: 96.3 fl (ref 78.0–100.0)
MONO ABS: 0.3 10*3/uL (ref 0.1–1.0)
Monocytes Relative: 6.8 % (ref 3.0–12.0)
NEUTROS ABS: 2.6 10*3/uL (ref 1.4–7.7)
NEUTROS PCT: 54.3 % (ref 43.0–77.0)
PLATELETS: 148 10*3/uL — AB (ref 150.0–400.0)
RBC: 4.2 Mil/uL (ref 3.87–5.11)
RDW: 14 % (ref 11.5–15.5)
WBC: 4.8 10*3/uL (ref 4.0–10.5)

## 2015-06-06 LAB — LIPID PANEL
CHOL/HDL RATIO: 2
Cholesterol: 177 mg/dL (ref 0–200)
HDL: 76.7 mg/dL (ref >39.00)
LDL CALC: 82 mg/dL (ref 0–99)
NONHDL: 100.72
Triglycerides: 95 mg/dL (ref 0.0–149.0)
VLDL: 19 mg/dL (ref 0.0–40.0)

## 2015-06-06 LAB — TSH: TSH: 1.99 u[IU]/mL (ref 0.35–4.50)

## 2015-06-13 ENCOUNTER — Ambulatory Visit (INDEPENDENT_AMBULATORY_CARE_PROVIDER_SITE_OTHER): Payer: Medicare Other | Admitting: Family Medicine

## 2015-06-13 ENCOUNTER — Encounter: Payer: Self-pay | Admitting: Family Medicine

## 2015-06-13 VITALS — BP 126/78 | HR 57 | Temp 98.0°F | Ht 65.0 in | Wt 109.2 lb

## 2015-06-13 DIAGNOSIS — Z23 Encounter for immunization: Secondary | ICD-10-CM | POA: Diagnosis not present

## 2015-06-13 DIAGNOSIS — M810 Age-related osteoporosis without current pathological fracture: Secondary | ICD-10-CM

## 2015-06-13 DIAGNOSIS — E2839 Other primary ovarian failure: Secondary | ICD-10-CM | POA: Insufficient documentation

## 2015-06-13 DIAGNOSIS — Z0000 Encounter for general adult medical examination without abnormal findings: Secondary | ICD-10-CM | POA: Diagnosis not present

## 2015-06-13 NOTE — Progress Notes (Signed)
Pre visit review using our clinic review tool, if applicable. No additional management support is needed unless otherwise documented below in the visit note. 

## 2015-06-13 NOTE — Progress Notes (Signed)
Subjective:    Patient ID: Kathy [name], female    DOB: Jan 05, 1941, 74 y.o.   MRN: [id number]  HPI Here for annual medicare wellness visit as well as chronic/acute medical problems and also annual preventative exam  I have personally reviewed the Medicare Annual Wellness questionnaire and have noted 1. The patient's medical and social history 2. Their use of alcohol, tobacco or illicit drugs 3. Their current medications and supplements 4. The patient's functional ability including ADL's, fall risks, home safety risks and hearing or visual             impairment. 5. Diet and physical activities 6. Evidence for depression or mood disorders  The patients weight, height, BMI have been recorded in the chart and visual acuity is per eye clinic.  I have made referrals, counseling and provided education to the patient based review of the above and I have provided the pt with a written personalized care plan for preventive services. Reviewed and updated provider list, see scanned forms.  Doing well  Wt is stable - bmi of 18 - is eating ok overall/good appetite  Had a good summer    See scanned forms.  Routine anticipatory guidance given to patient.  See health maintenance. Colon cancer screening 10/14- polyp (was ok)- does not think she will need another  Breast cancer screening 6/16 neg/hx of breast cancer with mastectomy Self breast exam - no changes  Flu vaccine -had today Tetanus vaccine 9/14 Pneumovax complete on both -last 8/15  Zoster vaccine 7/07 dexa 9/14 - OP (no falls or fractures)- still taking evista for fracture prevention, on ca and D - will schedule 2 y dexa  Also exercises ! Regularly - gym and walking and taking classes  Advance directive- has living will and power of attorney  Cognitive function addressed- see scanned forms- and if abnormal then additional documentation follows.  Thinks she does well with memory   PMH and SH reviewed  Meds, vitals, and allergies  reviewed.   ROS: See HPI.  Otherwise negative.       Results for orders placed or performed in visit on 06/06/15  Lipid panel  Result Value Ref Range   Cholesterol 177 0 - 200 mg/dL   Triglycerides 95.0 0.0 - 149.0 mg/dL   HDL 76.70 >39.00 mg/dL   VLDL 19.0 0.0 - 40.0 mg/dL   LDL Cholesterol 82 0 - 99 mg/dL   Total CHOL/HDL Ratio 2    NonHDL 100.72   TSH  Result Value Ref Range   TSH 1.99 0.35 - 4.50 uIU/mL  CBC with Differential/Platelet  Result Value Ref Range   WBC 4.8 4.0 - 10.5 K/uL   RBC 4.20 3.87 - 5.11 Mil/uL   Hemoglobin 13.5 12.0 - 15.0 g/dL   HCT 40.4 36.0 - 46.0 %   MCV 96.3 78.0 - 100.0 fl   MCHC 33.3 30.0 - 36.0 g/dL   RDW 14.0 11.5 - 15.5 %   Platelets 148.0 (L) 150.0 - 400.0 K/uL   Neutrophils Relative % 54.3 43.0 - 77.0 %   Lymphocytes Relative 29.9 12.0 - 46.0 %   Monocytes Relative 6.8 3.0 - 12.0 %   Eosinophils Relative 8.4 (H) 0.0 - 5.0 %   Basophils Relative 0.6 0.0 - 3.0 %   Neutro Abs 2.6 1.4 - 7.7 K/uL   Lymphs Abs 1.4 0.7 - 4.0 K/uL   Monocytes Absolute 0.3 0.1 - 1.0 K/uL   Eosinophils Absolute 0.4 0.0 - 0.7 K/uL  Basophils Absolute 0.0 0.0 - 0.1 K/uL  Comprehensive metabolic panel  Result Value Ref Range   Sodium 145 135 - 145 mEq/L   Potassium 4.2 3.5 - 5.1 mEq/L   Chloride 109 96 - 112 mEq/L   CO2 31 19 - 32 mEq/L   Glucose, Bld 84 70 - 99 mg/dL   BUN 19 6 - 23 mg/dL   Creatinine, Ser 0.58 0.40 - 1.20 mg/dL   Total Bilirubin 0.6 0.2 - 1.2 mg/dL   Alkaline Phosphatase 42 39 - 117 U/L   AST 15 0 - 37 U/L   ALT 10 0 - 35 U/L   Total Protein 5.9 (L) 6.0 - 8.3 g/dL   Albumin 3.9 3.5 - 5.2 g/dL   Calcium 9.0 8.4 - 10.5 mg/dL   GFR 107.69 >60.00 mL/min    Great cholesterol!!  Patient Active Problem List   Diagnosis Date Noted  . Routine general medical examination at a health care facility 06/05/2015  . Cerumen impaction 05/30/2014  . Encounter for Medicare annual wellness exam 05/23/2013  . Vertigo 05/26/2012  . INTERMITTENT  VERTIGO 06/26/2010  . COMPRESSION FRACTURE, L2 VERTEBRA 05/10/2009  . BOILS, RECURRENT 04/11/2008  . Screening for lipoid disorders 04/01/2007  . OVERACTIVE BLADDER 04/01/2007  . Osteoporosis 04/01/2007  . BREAST CANCER, HX OF 04/01/2007   Past Medical History  Diagnosis Date  . Cancer     Hx of breast CA  . Hyperlipidemia   . Osteoporosis   . Glaucoma    Past Surgical History  Procedure Laterality Date  . Mastectomy  03/2001    left  . Tonsillectomy    . Dilation and curettage of uterus    . Breast surgery  04/06/2001    mastectomy - left side  . Eye surgery  2010    cataract - left   Social History  Substance Use Topics  . Smoking status: Never Smoker   . Smokeless tobacco: Never Used  . Alcohol Use: No   Family History  Problem Relation Age of Onset  . Heart disease Mother     CHF  . Hypertension Mother   . Stroke Father   . Hypertension Father   . Osteoporosis Father   . Heart disease Father     CHF  . Breast cancer Cousin   . Colon cancer Neg Hx    No Known Allergies Current Outpatient Prescriptions on File Prior to Visit  Medication Sig Dispense Refill  . aspirin 81 MG tablet Take 81 mg by mouth daily.      . bimatoprost (LUMIGAN) 0.03 % ophthalmic solution Place 1 drop into both eyes at bedtime.    . Brinzolamide-Brimonidine (SIMBRINZA OP) Place 1 drop into both eyes 2 (two) times daily.    . Calcium Carbonate-Vitamin D (CALCIUM-VITAMIN D3) 600-125 MG-UNIT TABS Take by mouth.    . cyanocobalamin 1000 MCG tablet Take 100 mcg by mouth daily.    . fish oil-omega-3 fatty acids 1000 MG capsule Take 1 g by mouth daily.     . Multiple Vitamin (MULTIVITAMIN) capsule Take 1 capsule by mouth daily.      . raloxifene (EVISTA) 60 MG tablet Take 1 tablet (60 mg total) by mouth daily. 30 tablet 11   No current facility-administered medications on file prior to visit.     Review of Systems Review of Systems  Constitutional: Negative for fever, appetite change,  fatigue and unexpected weight change.  Eyes: Negative for pain and visual disturbance.  Respiratory: Negative for  cough and shortness of breath.   Cardiovascular: Negative for cp or palpitations    Gastrointestinal: Negative for nausea, diarrhea and constipation.  Genitourinary: Negative for urgency and frequency.  Skin: Negative for pallor or rash   MSK pos for some joint stiffness/aches/pains  Neurological: Negative for weakness, light-headedness, numbness and headaches.  Hematological: Negative for adenopathy. Does not bruise/bleed easily.  Psychiatric/Behavioral: Negative for dysphoric mood. The patient is not nervous/anxious.         Objective:   Physical Exam  Constitutional: She appears well-developed and well-nourished. No distress.  HENT:  Head: Normocephalic and atraumatic.  Right Ear: External ear normal.  Left Ear: External ear normal.  Mouth/Throat: Oropharynx is clear and moist.  Eyes: Conjunctivae and EOM are normal. Pupils are equal, round, and reactive to light. No scleral icterus.  Neck: Normal range of motion. Neck supple. No JVD present. Carotid bruit is not present. No thyromegaly present.  Cardiovascular: Normal rate, regular rhythm, normal heart sounds and intact distal pulses.  Exam reveals no gallop.   Pulmonary/Chest: Effort normal and breath sounds normal. No respiratory distress. She has no wheezes. She exhibits no tenderness.  Abdominal: Soft. Bowel sounds are normal. She exhibits no distension, no abdominal bruit and no mass. There is no tenderness.  Genitourinary: No breast swelling, tenderness, discharge or bleeding.  L mastectomy -site unremarkable  R breast: No mass, nodules, thickening, tenderness, bulging, retraction, inflamation, nipple discharge or skin changes noted.  No axillary or clavicular LA.      Musculoskeletal: Normal range of motion. She exhibits no edema or tenderness.  Mild kyphosis   Lymphadenopathy:    She has no cervical  adenopathy.  Neurological: She is alert. She has normal reflexes. No cranial nerve deficit. She exhibits normal muscle tone. Coordination normal.  Skin: Skin is warm and dry. No rash noted. No erythema. No pallor.  Psychiatric: She has a normal mood and affect.          Assessment & Plan:   Problem List Items Addressed This Visit      Musculoskeletal and Integument   Osteoporosis    Due for dexa -will schedule  On ca and D and evista  No falls or fractures  Exercising  Disc need for calcium/ vitamin D/ wt bearing exercise and bone density test every 2 y to monitor Disc safety/ fracture risk in detail          Other   Encounter for Medicare annual wellness exam - Primary    Reviewed health habits including diet and exercise and skin cancer prevention Reviewed appropriate screening tests for age  Also reviewed health mt list, fam hx and immunization status , as well as social and family history   See HPI Labs reviewed Stop at check out for referral for bone density test  Flu shot today  Stay active !!       Estrogen deficiency    Ref for dexa Known hx of OP post men      Relevant Orders   DG Bone Density   Routine general medical examination at a health care facility    Reviewed health habits including diet and exercise and skin cancer prevention Reviewed appropriate screening tests for age  Also reviewed health mt list, fam hx and immunization status , as well as social and family history   See HPI Labs reviewed Stop at check out for referral for bone density test  Flu shot today  Stay active !!  Other Visit Diagnoses    Need for influenza vaccination        Relevant Orders    Flu Vaccine QUAD 36+ mos PF IM (Fluarix & Fluzone Quad PF) (Completed)

## 2015-06-13 NOTE — Patient Instructions (Signed)
No change in medicines  Stop at check out for referral for bone density test  Flu shot today  Stay active !!

## 2015-06-14 NOTE — Assessment & Plan Note (Signed)
Ref for dexa Known hx of OP post men

## 2015-06-14 NOTE — Assessment & Plan Note (Signed)
Reviewed health habits including diet and exercise and skin cancer prevention Reviewed appropriate screening tests for age  Also reviewed health mt list, fam hx and immunization status , as well as social and family history   See HPI Labs reviewed Stop at check out for referral for bone density test  Flu shot today  Stay active !!

## 2015-06-14 NOTE — Assessment & Plan Note (Signed)
Due for dexa -will schedule  On ca and D and evista  No falls or fractures  Exercising  Disc need for calcium/ vitamin D/ wt bearing exercise and bone density test every 2 y to monitor Disc safety/ fracture risk in detail

## 2015-06-27 ENCOUNTER — Ambulatory Visit (INDEPENDENT_AMBULATORY_CARE_PROVIDER_SITE_OTHER)
Admission: RE | Admit: 2015-06-27 | Discharge: 2015-06-27 | Disposition: A | Discharge status: Home / Self Care | Payer: Medicare Other | Source: Ambulatory Visit | Attending: Family Medicine | Admitting: Family Medicine

## 2015-06-27 DIAGNOSIS — E2839 Other primary ovarian failure: Secondary | ICD-10-CM | POA: Diagnosis not present

## 2015-06-27 LAB — HM DEXA SCAN

## 2015-06-28 ENCOUNTER — Other Ambulatory Visit: Payer: Medicare Other

## 2015-07-12 ENCOUNTER — Encounter: Payer: Self-pay | Admitting: *Deleted

## 2015-07-12 ENCOUNTER — Encounter: Payer: Self-pay | Admitting: Family Medicine

## 2015-07-18 ENCOUNTER — Encounter: Payer: Self-pay | Admitting: *Deleted

## 2015-09-28 ENCOUNTER — Encounter: Payer: Self-pay | Admitting: *Deleted

## 2015-10-03 ENCOUNTER — Other Ambulatory Visit: Payer: Self-pay | Admitting: Family Medicine

## 2015-10-10 DIAGNOSIS — H401131 Primary open-angle glaucoma, bilateral, mild stage: Secondary | ICD-10-CM | POA: Diagnosis not present

## 2015-10-17 DIAGNOSIS — H401131 Primary open-angle glaucoma, bilateral, mild stage: Secondary | ICD-10-CM | POA: Diagnosis not present

## 2016-01-29 ENCOUNTER — Other Ambulatory Visit: Payer: Self-pay

## 2016-01-29 DIAGNOSIS — Z1231 Encounter for screening mammogram for malignant neoplasm of breast: Secondary | ICD-10-CM

## 2016-02-01 ENCOUNTER — Telehealth: Payer: Self-pay

## 2016-02-01 ENCOUNTER — Other Ambulatory Visit: Payer: Self-pay

## 2016-02-01 MED ORDER — MECLIZINE HCL 12.5 MG PO TABS: 12.5000 mg | ORAL_TABLET | Freq: Three times a day (TID) | ORAL | Status: DC | PRN

## 2016-02-01 MED ORDER — CEPHALEXIN 500 MG PO CAPS: ORAL_CAPSULE | ORAL | Status: DC

## 2016-02-01 NOTE — Telephone Encounter (Signed)
500 mg bid for 7 d  Thanks

## 2016-02-01 NOTE — Telephone Encounter (Signed)
That is fine Please print them and put them on my desk and I will sign in the am

## 2016-02-01 NOTE — Telephone Encounter (Signed)
Rxs printed and pt advise she can pick them up at the front desk tomorrow. Rxs placed in Dr. Marliss Coots inbox

## 2016-02-01 NOTE — Telephone Encounter (Signed)
Pt wants to know if the 2 prescriptions pt still has that were written in 2012 or 2013 are still fillable. Advised pt I doubt can fill rx that old but pt wants to ck with pharmacy. If pharmacy cannot fill then pt will cb with the names of the meds pt is requesting and which pharmacy they are to go to. Pt last annual exam on 06/13/15.

## 2016-02-01 NOTE — Telephone Encounter (Signed)
Pt left /vm requesting printed rx for meclizine and Keflex; pt said was in 2012 or 2013 when rx was written but pharmacy will not fill now. Pt wants to take printed rx with her when she goes out of town in case pt needs them. Pt request cb. Last annual exam on 06/13/15.

## 2016-02-01 NOTE — Telephone Encounter (Signed)
What strength and amount of Keflex did you want to fill, please advise

## 2016-03-19 ENCOUNTER — Ambulatory Visit
Admission: RE | Admit: 2016-03-19 | Discharge: 2016-03-19 | Disposition: A | Discharge status: Home / Self Care | Payer: Medicare Other | Source: Ambulatory Visit

## 2016-03-19 DIAGNOSIS — Z1231 Encounter for screening mammogram for malignant neoplasm of breast: Secondary | ICD-10-CM | POA: Diagnosis not present

## 2016-04-16 DIAGNOSIS — H401131 Primary open-angle glaucoma, bilateral, mild stage: Secondary | ICD-10-CM | POA: Diagnosis not present

## 2016-05-30 ENCOUNTER — Ambulatory Visit (INDEPENDENT_AMBULATORY_CARE_PROVIDER_SITE_OTHER): Payer: Medicare Other | Admitting: Family Medicine

## 2016-05-30 ENCOUNTER — Encounter: Payer: Self-pay | Admitting: Family Medicine

## 2016-05-30 VITALS — BP 122/62 | Temp 98.0°F | Wt 110.0 lb

## 2016-05-30 DIAGNOSIS — S39012A Strain of muscle, fascia and tendon of lower back, initial encounter: Secondary | ICD-10-CM

## 2016-05-30 MED ORDER — CYCLOBENZAPRINE HCL 5 MG PO TABS: 10.0000 mg | ORAL_TABLET | Freq: Every evening | ORAL | 0 refills | Status: DC | PRN

## 2016-05-30 NOTE — Progress Notes (Signed)
Subjective:    Patient ID: Kathy [name], female    DOB: 11-07-1940, 75 y.o.   MRN: [id number]  HPI This is a 75 yo female who presents with left sided, low back and hip pain for 2 days. Two days ago she leaned down to pick up a tree limb and felt pain in her low back. Did not hear a pop. Pain started that evening and has not gotten any better. Has uses aspercream and heat/ice without relief. Took ibuprofen 200 mg this morning, not sure if it helped. Hot shower helped. Had severe pain and stiffness this morning when getting up, pain improved significantly with getting up and moving around. No pain when sitting still, pain with movement, walking. No radiation down legs, no numbness/tingling, no weakness, no foot drop, no falls. No changes in bowels/bladder. She is very active, walking on the treadmill and doing strength training 3x/ week at the gym. She walks outside on days she doesn't go to the gym.  She has an appointment with Dr. Glori Bickers next month. Rarely requires medical care.   Past Medical History:  Diagnosis Date  . Cancer ([place])    Hx of breast CA  . Glaucoma   . Hyperlipidemia   . Osteoporosis    Past Surgical History:  Procedure Laterality Date  . BREAST SURGERY  04/06/2001   mastectomy - left side  . DILATION AND CURETTAGE OF UTERUS    . EYE SURGERY  2010   cataract - left  . MASTECTOMY  03/2001   left  . TONSILLECTOMY     Family History  Problem Relation Age of Onset  . Heart disease Mother     CHF  . Hypertension Mother   . Stroke Father   . Hypertension Father   . Osteoporosis Father   . Heart disease Father     CHF  . Breast cancer Cousin   . Colon cancer Neg Hx    Social History  Substance Use Topics  . Smoking status: Never Smoker  . Smokeless tobacco: Never Used  . Alcohol use No      Review of Systems Per HPI    Objective:   Physical Exam  Constitutional: She is oriented to person, place, and time. She appears well-developed. No distress.  thin    HENT:  Head: Normocephalic and atraumatic.  Eyes: Conjunctivae are normal.  Neck: Normal range of motion. Neck supple.  Cardiovascular: Normal rate.   Pulmonary/Chest: Effort normal.  Musculoskeletal: She exhibits no edema.       Cervical back: Normal.       Thoracic back: Normal.       Lumbar back: She exhibits pain (pain with rotation to right, unable to produce pain with palpation. ). She exhibits normal range of motion, no tenderness, no bony tenderness, no swelling and no edema.  Neurological: She is alert and oriented to person, place, and time. She has normal reflexes.  Skin: Skin is warm and dry. She is not diaphoretic.  Psychiatric: She has a normal mood and affect. Her behavior is normal. Judgment and thought content normal.  Vitals reviewed.     BP 122/62   Temp 98 F (36.7 C)   Wt 110 lb (49.9 kg)   BMI 18.30 kg/m  Wt Readings from Last 3 Encounters:  05/30/16 110 lb (49.9 kg)  06/13/15 109 lb 4 oz (49.6 kg)  05/30/14 109 lb 8 oz (49.7 kg)       Assessment & Plan:  1.  Low back strain, initial encounter - Provided written and verbal information regarding diagnosis and treatment. - can take ibuprofen 400 mg every 8 hours for next two days, heat prn, gentle ROM 2-3 times a day, avoid prolonged sitting or standing while awake. Carefully reintroduce exercise as tolerated. - RTC precautions reviewed - cyclobenzaprine (FLEXERIL) 5 MG tablet; Take 2 tablets (10 mg total) by mouth at bedtime as needed for muscle spasms.  Dispense: 15 tablet; Refill: 0   Clarene Reamer, FNP-BC  [facility]  05/30/2016 10:31 AM

## 2016-05-30 NOTE — Progress Notes (Signed)
Pre visit review using our clinic review tool, if applicable. No additional management support is needed unless otherwise documented below in the visit note. 

## 2016-05-30 NOTE — Patient Instructions (Signed)
You can take 2 ibuprofen every 8 hours as needed for pain/inflammation for the next 2 days.  Do gentle stretches of your back several times a day  Lumbosacral Strain Lumbosacral strain is a strain of any of the parts that make up your lumbosacral vertebrae. Your lumbosacral vertebrae are the bones that make up the lower third of your backbone. Your lumbosacral vertebrae are held together by muscles and tough, fibrous tissue (ligaments).  CAUSES  A sudden blow to your back can cause lumbosacral strain. Also, anything that causes an excessive stretch of the muscles in the low back can cause this strain. This is typically seen when people exert themselves strenuously, fall, lift heavy objects, bend, or crouch repeatedly. RISK FACTORS  Physically demanding work.  Participation in pushing or pulling sports or sports that require a sudden twist of the back (tennis, golf, baseball).  Weight lifting.  Excessive lower back curvature.  Forward-tilted pelvis.  Weak back or abdominal muscles or both.  Tight hamstrings. SIGNS AND SYMPTOMS  Lumbosacral strain may cause pain in the area of your injury or pain that moves (radiates) down your leg.  DIAGNOSIS Your health care provider can often diagnose lumbosacral strain through a physical exam. In some cases, you may need tests such as X-ray exams.  TREATMENT  Treatment for your lower back injury depends on many factors that your clinician will have to evaluate. However, most treatment will include the use of anti-inflammatory medicines. HOME CARE INSTRUCTIONS   Avoid hard physical activities (tennis, racquetball, waterskiing) if you are not in proper physical condition for it. This may aggravate or create problems.  If you have a back problem, avoid sports requiring sudden body movements. Swimming and walking are generally safer activities.  Maintain good posture.  Maintain a healthy weight.  For acute conditions, you may put ice on the  injured area.  Put ice in a plastic bag.  Place a towel between your skin and the bag.  Leave the ice on for 20 minutes, 2-3 times a day.  When the low back starts healing, stretching and strengthening exercises may be recommended. SEEK MEDICAL CARE IF:  Your back pain is getting worse.  You experience severe back pain not relieved with medicines. SEEK IMMEDIATE MEDICAL CARE IF:   You have numbness, tingling, weakness, or problems with the use of your arms or legs.  There is a change in bowel or bladder control.  You have increasing pain in any area of the body, including your belly (abdomen).  You notice shortness of breath, dizziness, or feel faint.  You feel sick to your stomach (nauseous), are throwing up (vomiting), or become sweaty.  You notice discoloration of your toes or legs, or your feet get very cold. MAKE SURE YOU:   Understand these instructions.  Will watch your condition.  Will get help right away if you are not doing well or get worse.   This information is not intended to replace advice given to you by your health care provider. Make sure you discuss any questions you have with your health care provider.   Document Released: 06/26/2005 Document Revised: 10/07/2014 Document Reviewed: 05/05/2013 Elsevier Interactive Patient Education [employer].

## 2016-06-06 ENCOUNTER — Telehealth: Payer: Self-pay | Admitting: Family Medicine

## 2016-06-06 DIAGNOSIS — Z13220 Encounter for screening for lipoid disorders: Secondary | ICD-10-CM

## 2016-06-06 DIAGNOSIS — M8088XS Other osteoporosis with current pathological fracture, vertebra(e), sequela: Secondary | ICD-10-CM

## 2016-06-06 DIAGNOSIS — M810 Age-related osteoporosis without current pathological fracture: Secondary | ICD-10-CM

## 2016-06-06 NOTE — Telephone Encounter (Signed)
-----   Message from Ellamae Sia sent at 06/05/2016  6:41 PM EDT ----- Regarding: Lab orders for Wednesday, 9.13.17 Patient is scheduled for CPX labs, please order future labs, Thanks , Karna Christmas

## 2016-06-12 ENCOUNTER — Other Ambulatory Visit (INDEPENDENT_AMBULATORY_CARE_PROVIDER_SITE_OTHER): Payer: Medicare Other

## 2016-06-12 DIAGNOSIS — M810 Age-related osteoporosis without current pathological fracture: Secondary | ICD-10-CM | POA: Diagnosis not present

## 2016-06-12 DIAGNOSIS — Z13220 Encounter for screening for lipoid disorders: Secondary | ICD-10-CM | POA: Diagnosis not present

## 2016-06-12 DIAGNOSIS — Z0000 Encounter for general adult medical examination without abnormal findings: Secondary | ICD-10-CM | POA: Diagnosis not present

## 2016-06-12 DIAGNOSIS — M8088XS Other osteoporosis with current pathological fracture, vertebra(e), sequela: Secondary | ICD-10-CM | POA: Diagnosis not present

## 2016-06-12 LAB — LIPID PANEL
CHOLESTEROL: 187 mg/dL (ref 0–200)
HDL: 81.8 mg/dL (ref >39.00)
LDL Cholesterol: 89 mg/dL (ref 0–99)
NONHDL: 105.56
Total CHOL/HDL Ratio: 2
Triglycerides: 82 mg/dL (ref 0.0–149.0)
VLDL: 16.4 mg/dL (ref 0.0–40.0)

## 2016-06-12 LAB — COMPREHENSIVE METABOLIC PANEL
ALT: 10 U/L (ref 0–35)
AST: 18 U/L (ref 0–37)
Albumin: 4.1 g/dL (ref 3.5–5.2)
Alkaline Phosphatase: 56 U/L (ref 39–117)
BUN: 21 mg/dL (ref 6–23)
CO2: 31 mEq/L (ref 19–32)
Calcium: 9.1 mg/dL (ref 8.4–10.5)
Chloride: 106 mEq/L (ref 96–112)
Creatinine, Ser: 0.64 mg/dL (ref 0.40–1.20)
GFR: 95.86 mL/min (ref >60.00)
Glucose, Bld: 88 mg/dL (ref 70–99)
Potassium: 4.4 mEq/L (ref 3.5–5.1)
Sodium: 142 mEq/L (ref 135–145)
Total Bilirubin: 0.5 mg/dL (ref 0.2–1.2)
Total Protein: 6.8 g/dL (ref 6.0–8.3)

## 2016-06-12 LAB — CBC WITH DIFFERENTIAL/PLATELET
BASOS ABS: 0 10*3/uL (ref 0.0–0.1)
Basophils Relative: 0.6 % (ref 0.0–3.0)
EOS PCT: 4.3 % (ref 0.0–5.0)
Eosinophils Absolute: 0.3 10*3/uL (ref 0.0–0.7)
HCT: 41.6 % (ref 36.0–46.0)
HEMOGLOBIN: 14.1 g/dL (ref 12.0–15.0)
LYMPHS ABS: 1.5 10*3/uL (ref 0.7–4.0)
Lymphocytes Relative: 23.1 % (ref 12.0–46.0)
MCHC: 33.8 g/dL (ref 30.0–36.0)
MCV: 95.3 fl (ref 78.0–100.0)
MONOS PCT: 6.2 % (ref 3.0–12.0)
Monocytes Absolute: 0.4 10*3/uL (ref 0.1–1.0)
NEUTROS PCT: 65.8 % (ref 43.0–77.0)
Neutro Abs: 4.2 10*3/uL (ref 1.4–7.7)
Platelets: 188 10*3/uL (ref 150.0–400.0)
RBC: 4.37 Mil/uL (ref 3.87–5.11)
RDW: 13.5 % (ref 11.5–15.5)
WBC: 6.3 10*3/uL (ref 4.0–10.5)

## 2016-06-12 LAB — TSH: TSH: 2.51 u[IU]/mL (ref 0.35–4.50)

## 2016-06-12 LAB — VITAMIN D 25 HYDROXY (VIT D DEFICIENCY, FRACTURES): VITD: 25.16 ng/mL — ABNORMAL LOW (ref 30.00–100.00)

## 2016-06-17 ENCOUNTER — Encounter: Payer: Self-pay | Admitting: Family Medicine

## 2016-06-17 ENCOUNTER — Ambulatory Visit (INDEPENDENT_AMBULATORY_CARE_PROVIDER_SITE_OTHER): Payer: Medicare Other | Admitting: Family Medicine

## 2016-06-17 VITALS — BP 116/62 | HR 66 | Temp 97.8°F | Ht 65.0 in | Wt 108.5 lb

## 2016-06-17 DIAGNOSIS — Z853 Personal history of malignant neoplasm of breast: Secondary | ICD-10-CM | POA: Diagnosis not present

## 2016-06-17 DIAGNOSIS — I8390 Asymptomatic varicose veins of unspecified lower extremity: Secondary | ICD-10-CM

## 2016-06-17 DIAGNOSIS — Z23 Encounter for immunization: Secondary | ICD-10-CM

## 2016-06-17 DIAGNOSIS — M810 Age-related osteoporosis without current pathological fracture: Secondary | ICD-10-CM | POA: Diagnosis not present

## 2016-06-17 DIAGNOSIS — R636 Underweight: Secondary | ICD-10-CM | POA: Diagnosis not present

## 2016-06-17 DIAGNOSIS — Z13220 Encounter for screening for lipoid disorders: Secondary | ICD-10-CM

## 2016-06-17 DIAGNOSIS — E559 Vitamin D deficiency, unspecified: Secondary | ICD-10-CM | POA: Insufficient documentation

## 2016-06-17 DIAGNOSIS — I8393 Asymptomatic varicose veins of bilateral lower extremities: Secondary | ICD-10-CM

## 2016-06-17 NOTE — Assessment & Plan Note (Signed)
Disc imp of eating healthy meals regularly with protein- at least 3 daily or more when exercising  Will continue to monitor Pt states her appetite is fine

## 2016-06-17 NOTE — Assessment & Plan Note (Signed)
On evista since 2014-no falls or new fx  Disc need for calcium/ vitamin D/ wt bearing exercise and bone density test every 2 y to monitor Disc safety/ fracture risk in detail   Will inc vit D to 3000 iu daily in light of low level

## 2016-06-17 NOTE — Patient Instructions (Addendum)
Make sure you are eating at least 3 small meals a day with protein  Stay active and drink your water  Increase your vitamin D to 3000 iu daily - really important for bones  If you want to do the medicare interview - schedule it out front (called [facility] or annual medicare wellness visit)  Flu shot today  For your legs- I think that support stockings to the knee are a good idea- start with over the counter type first

## 2016-06-17 NOTE — Assessment & Plan Note (Signed)
R leg under knee-compressible w/o skin changes  Recommend support stockings and elevation when sitting  Continue to watch

## 2016-06-17 NOTE — Progress Notes (Signed)
Pre visit review using our clinic review tool, if applicable. No additional management support is needed unless otherwise documented below in the visit note. 

## 2016-06-17 NOTE — Progress Notes (Signed)
Subjective:    Patient ID: Kathy [name], female    DOB: [birth year], 75 y.o.   MRN: [id number]  HPI Here for f/u of chronic medical problems   Wt Readings from Last 3 Encounters:  06/17/16 108 lb 8 oz (49.2 kg)  05/30/16 110 lb (49.9 kg)  06/13/15 109 lb 4 oz (49.6 kg)  has always been very slim - pt states she is very active and eats regularly  Eating chicken for protein, not a lot of eggs, lots of yogurt and dairy , also PB and nuts  Eats one big meal per day - snacks the rest  Does a class at the gym 3 days per week and walks 2 or more mi per day  bmi is 18.06  Has not had AMW- she may schedule it   Flu shot - today   Colonoscopy 10/14- told her that was the last one she will have   Mammogram 6/17-neg Hx of L mastecomy Self exam - no lumps or changes   Tetanus shot 9/14  No gyn problems   utd other vaccines   dexa 9/16 - osteoporosis what was worse than previous  Hx of compression fx  No falls since the last visit  No fractures in the past year  D is low at 25.1- she takes 1000 iu daily  On evista since 2014 No problems with that   Results for orders placed or performed in visit on 06/12/16  CBC with Differential/Platelet  Result Value Ref Range   WBC 6.3 4.0 - 10.5 K/uL   RBC 4.37 3.87 - 5.11 Mil/uL   Hemoglobin 14.1 12.0 - 15.0 g/dL   HCT 41.6 36.0 - 46.0 %   MCV 95.3 78.0 - 100.0 fl   MCHC 33.8 30.0 - 36.0 g/dL   RDW 13.5 11.5 - 15.5 %   Platelets 188.0 150.0 - 400.0 K/uL   Neutrophils Relative % 65.8 43.0 - 77.0 %   Lymphocytes Relative 23.1 12.0 - 46.0 %   Monocytes Relative 6.2 3.0 - 12.0 %   Eosinophils Relative 4.3 0.0 - 5.0 %   Basophils Relative 0.6 0.0 - 3.0 %   Neutro Abs 4.2 1.4 - 7.7 K/uL   Lymphs Abs 1.5 0.7 - 4.0 K/uL   Monocytes Absolute 0.4 0.1 - 1.0 K/uL   Eosinophils Absolute 0.3 0.0 - 0.7 K/uL   Basophils Absolute 0.0 0.0 - 0.1 K/uL  Comprehensive metabolic panel  Result Value Ref Range   Sodium 142 135 - 145 mEq/L   Potassium  4.4 3.5 - 5.1 mEq/L   Chloride 106 96 - 112 mEq/L   CO2 31 19 - 32 mEq/L   Glucose, Bld 88 70 - 99 mg/dL   BUN 21 6 - 23 mg/dL   Creatinine, Ser 0.64 0.40 - 1.20 mg/dL   Total Bilirubin 0.5 0.2 - 1.2 mg/dL   Alkaline Phosphatase 56 39 - 117 U/L   AST 18 0 - 37 U/L   ALT 10 0 - 35 U/L   Total Protein 6.8 6.0 - 8.3 g/dL   Albumin 4.1 3.5 - 5.2 g/dL   Calcium 9.1 8.4 - 10.5 mg/dL   GFR 95.86 >60.00 mL/min  Lipid panel  Result Value Ref Range   Cholesterol 187 0 - 200 mg/dL   Triglycerides 82.0 0.0 - 149.0 mg/dL   HDL 81.80 >39.00 mg/dL   VLDL 16.4 0.0 - 40.0 mg/dL   LDL Cholesterol 89 0 - 99 mg/dL   Total CHOL/HDL Ratio  2    NonHDL 105.56   TSH  Result Value Ref Range   TSH 2.51 0.35 - 4.50 uIU/mL  VITAMIN D 25 Hydroxy (Vit-D Deficiency, Fractures)  Result Value Ref Range   VITD 25.16 (L) 30.00 - 100.00 ng/mL     Patient Active Problem List   Diagnosis Date Noted  . Underweight 06/17/2016  . Varicose vein of leg 06/17/2016  . Vitamin D deficiency 06/17/2016  . Estrogen deficiency 06/13/2015  . Routine general medical examination at a health care facility 06/05/2015  . Cerumen impaction 05/30/2014  . Encounter for Medicare annual wellness exam 05/23/2013  . Vertigo 05/26/2012  . INTERMITTENT VERTIGO 06/26/2010  . COMPRESSION FRACTURE, L2 VERTEBRA 05/10/2009  . BOILS, RECURRENT 04/11/2008  . Screening for lipoid disorders 04/01/2007  . OVERACTIVE BLADDER 04/01/2007  . Osteoporosis 04/01/2007  . BREAST CANCER, HX OF 04/01/2007   Past Medical History:  Diagnosis Date  . Cancer ([place])    Hx of breast CA  . Glaucoma   . Hyperlipidemia   . Osteoporosis    Past Surgical History:  Procedure Laterality Date  . BREAST SURGERY  04/06/2001   mastectomy - left side  . DILATION AND CURETTAGE OF UTERUS    . EYE SURGERY  2010   cataract - left  . MASTECTOMY  03/2001   left  . TONSILLECTOMY     Social History  Substance Use Topics  . Smoking status: Never Smoker  .  Smokeless tobacco: Never Used  . Alcohol use No   Family History  Problem Relation Age of Onset  . Heart disease Mother     CHF  . Hypertension Mother   . Stroke Father   . Hypertension Father   . Osteoporosis Father   . Heart disease Father     CHF  . Breast cancer Cousin   . Colon cancer Neg Hx    No Known Allergies Current Outpatient Prescriptions on File Prior to Visit  Medication Sig Dispense Refill  . aspirin 81 MG tablet Take 81 mg by mouth daily.      . bimatoprost (LUMIGAN) 0.03 % ophthalmic solution Place 1 drop into both eyes at bedtime.    . Brinzolamide-Brimonidine (SIMBRINZA OP) Place 1 drop into both eyes 2 (two) times daily.    . Calcium Carbonate-Vitamin D (CALCIUM-VITAMIN D3) 600-125 MG-UNIT TABS Take by mouth.    . cyanocobalamin 1000 MCG tablet Take 100 mcg by mouth daily.    . cyclobenzaprine (FLEXERIL) 5 MG tablet Take 2 tablets (10 mg total) by mouth at bedtime as needed for muscle spasms. 15 tablet 0  . dorzolamide-timolol (COSOPT) 22.3-6.8 MG/ML ophthalmic solution Place 22.3 mLs into both eyes 2 (two) times daily.    . fish oil-omega-3 fatty acids 1000 MG capsule Take 1 g by mouth daily.     . Multiple Vitamin (MULTIVITAMIN) capsule Take 1 capsule by mouth daily.      . raloxifene (EVISTA) 60 MG tablet TAKE 1 TABLET BY MOUTH DAILY 30 tablet 11   No current facility-administered medications on file prior to visit.     Review of Systems Review of Systems  Constitutional: Negative for fever, appetite change, fatigue and unexpected weight change.  Eyes: Negative for pain and visual disturbance.  Respiratory: Negative for cough and shortness of breath.   Cardiovascular: Negative for cp or palpitations    Gastrointestinal: Negative for nausea, diarrhea and constipation.  Genitourinary: Negative for urgency and frequency.  Skin: Negative for pallor or rash  Neurological: Negative for weakness, light-headedness, numbness and headaches.  Hematological:  Negative for adenopathy. Does not bruise/bleed easily.  Psychiatric/Behavioral: Negative for dysphoric mood. The patient is not nervous/anxious.         Objective:   Physical Exam  Constitutional: She appears well-developed and well-nourished. No distress.  Underweight and well appearing   HENT:  Head: Normocephalic and atraumatic.  Right Ear: External ear normal.  Left Ear: External ear normal.  Mouth/Throat: Oropharynx is clear and moist.  Eyes: Conjunctivae and EOM are normal. Pupils are equal, round, and reactive to light. No scleral icterus.  Neck: Normal range of motion. Neck supple. No JVD present. Carotid bruit is not present. No thyromegaly present.  Cardiovascular: Normal rate, regular rhythm, normal heart sounds and intact distal pulses.  Exam reveals no gallop.   Tortuous varicosity on lower L leg that is compressible w/o skin change or tenderness  Pulmonary/Chest: Effort normal and breath sounds normal. No respiratory distress. She has no wheezes. She exhibits no tenderness.  Abdominal: Soft. Bowel sounds are normal. She exhibits no distension, no abdominal bruit and no mass. There is no tenderness.  Genitourinary: No breast swelling, tenderness, discharge or bleeding.  Genitourinary Comments: R breast exam: : No mass, nodules, thickening, tenderness, bulging, retraction, inflamation, nipple discharge or skin changes noted.  No axillary or clavicular LA.     L mastectomy site-is clear of lumps or skin changes    Musculoskeletal: Normal range of motion. She exhibits no edema or tenderness.  Mild kyphosis   Lymphadenopathy:    She has no cervical adenopathy.  Neurological: She is alert. She has normal reflexes. No cranial nerve deficit. She exhibits normal muscle tone. Coordination normal.  Skin: Skin is warm and dry. No rash noted. No erythema. No pallor.  sks diffusely  Psychiatric: She has a normal mood and affect.  Pleasant and mentally sharp           Assessment  & Plan:   Problem List Items Addressed This Visit      Cardiovascular and Mediastinum   Varicose vein of leg    R leg under knee-compressible w/o skin changes  Recommend support stockings and elevation when sitting  Continue to watch         Musculoskeletal and Integument   Osteoporosis - Primary    On evista since 2014-no falls or new fx  Disc need for calcium/ vitamin D/ wt bearing exercise and bone density test every 2 y to monitor Disc safety/ fracture risk in detail   Will inc vit D to 3000 iu daily in light of low level        Other   Vitamin D deficiency    Level of 25 Will inc daily D to 3000 iu  Disc imp to bone and overall health  Will continue to follow       Underweight    Disc imp of eating healthy meals regularly with protein- at least 3 daily or more when exercising  Will continue to monitor Pt states her appetite is fine      Screening for lipoid disorders    Disc goals for lipids and reasons to control them Rev labs with pt Rev low sat fat diet in detail       BREAST CANCER, HX OF    Mammogram report neg for R breast Nl exam of L breast and R mastectomy site  Enc self exams and continued screening        Other Visit  Diagnoses    Need for influenza vaccination       Relevant Orders   Flu Vaccine QUAD 36+ mos IM (Completed)

## 2016-06-17 NOTE — Assessment & Plan Note (Signed)
Disc goals for lipids and reasons to control them Rev labs with pt Rev low sat fat diet in detail   

## 2016-06-17 NOTE — Assessment & Plan Note (Signed)
Level of 25 Will inc daily D to 3000 iu  Disc imp to bone and overall health  Will continue to follow

## 2016-06-17 NOTE — Assessment & Plan Note (Addendum)
Mammogram report neg for R breast Nl exam of R breast and L mastectomy site  Enc self exams and continued screening

## 2016-09-05 ENCOUNTER — Other Ambulatory Visit: Payer: Self-pay

## 2016-10-07 ENCOUNTER — Other Ambulatory Visit: Payer: Self-pay

## 2016-10-07 MED ORDER — RALOXIFENE HCL 60 MG PO TABS: 60.0000 mg | ORAL_TABLET | Freq: Every day | ORAL | 8 refills | Status: DC

## 2016-10-07 NOTE — Telephone Encounter (Signed)
Pt request refill raloxifene to [facility]. Last annual 05/2016. Advised pt done per protocol.

## 2016-10-15 DIAGNOSIS — H401131 Primary open-angle glaucoma, bilateral, mild stage: Secondary | ICD-10-CM | POA: Diagnosis not present

## 2016-10-22 DIAGNOSIS — H401131 Primary open-angle glaucoma, bilateral, mild stage: Secondary | ICD-10-CM | POA: Diagnosis not present

## 2017-01-06 DIAGNOSIS — M79671 Pain in right foot: Secondary | ICD-10-CM | POA: Diagnosis not present

## 2017-01-06 DIAGNOSIS — D2372 Other benign neoplasm of skin of left lower limb, including hip: Secondary | ICD-10-CM | POA: Diagnosis not present

## 2017-01-06 DIAGNOSIS — M79672 Pain in left foot: Secondary | ICD-10-CM | POA: Diagnosis not present

## 2017-01-06 DIAGNOSIS — D2371 Other benign neoplasm of skin of right lower limb, including hip: Secondary | ICD-10-CM | POA: Diagnosis not present

## 2017-01-08 ENCOUNTER — Telehealth: Payer: Self-pay

## 2017-01-08 NOTE — Telephone Encounter (Signed)
Patient called requesting some info on the new shingles vaccine, she was advised that it is recommended she gets the shingrix even though she had the zostavax. Pt is to check with insurance if they will cover for it and she can either wait until we get it in about a month or we can send a request to their PCP and e-scribe it for her, pt verbalized understanding.

## 2017-01-28 DIAGNOSIS — M79671 Pain in right foot: Secondary | ICD-10-CM | POA: Diagnosis not present

## 2017-01-28 DIAGNOSIS — D2372 Other benign neoplasm of skin of left lower limb, including hip: Secondary | ICD-10-CM | POA: Diagnosis not present

## 2017-01-28 DIAGNOSIS — D2371 Other benign neoplasm of skin of right lower limb, including hip: Secondary | ICD-10-CM | POA: Diagnosis not present

## 2017-01-28 DIAGNOSIS — M79672 Pain in left foot: Secondary | ICD-10-CM | POA: Diagnosis not present

## 2017-04-22 ENCOUNTER — Other Ambulatory Visit: Payer: Self-pay | Admitting: General Surgery

## 2017-04-22 DIAGNOSIS — H401131 Primary open-angle glaucoma, bilateral, mild stage: Secondary | ICD-10-CM | POA: Diagnosis not present

## 2017-04-22 DIAGNOSIS — Z1231 Encounter for screening mammogram for malignant neoplasm of breast: Secondary | ICD-10-CM

## 2017-04-24 ENCOUNTER — Ambulatory Visit
Admission: RE | Admit: 2017-04-24 | Discharge: 2017-04-24 | Disposition: A | Discharge status: Home / Self Care | Payer: Medicare Other | Source: Ambulatory Visit | Attending: General Surgery | Admitting: General Surgery

## 2017-04-24 DIAGNOSIS — Z1231 Encounter for screening mammogram for malignant neoplasm of breast: Secondary | ICD-10-CM

## 2017-04-25 ENCOUNTER — Encounter: Payer: Self-pay | Admitting: *Deleted

## 2017-06-03 DIAGNOSIS — Z23 Encounter for immunization: Secondary | ICD-10-CM | POA: Diagnosis not present

## 2017-06-17 ENCOUNTER — Telehealth: Payer: Self-pay | Admitting: Family Medicine

## 2017-06-17 DIAGNOSIS — E559 Vitamin D deficiency, unspecified: Secondary | ICD-10-CM

## 2017-06-17 DIAGNOSIS — Z0000 Encounter for general adult medical examination without abnormal findings: Secondary | ICD-10-CM

## 2017-06-17 DIAGNOSIS — Z13220 Encounter for screening for lipoid disorders: Secondary | ICD-10-CM

## 2017-06-17 NOTE — Telephone Encounter (Signed)
-----   Message from Eustace Pen, LPN sent at 0/05/2329  5:34 PM EDT ----- Regarding: Labs 9/19 Please place lab orders.  Traditional Medicare

## 2017-06-18 ENCOUNTER — Ambulatory Visit (INDEPENDENT_AMBULATORY_CARE_PROVIDER_SITE_OTHER): Payer: Medicare Other

## 2017-06-18 VITALS — BP 96/60 | HR 58 | Temp 97.4°F | Ht 65.0 in | Wt 108.5 lb

## 2017-06-18 DIAGNOSIS — E559 Vitamin D deficiency, unspecified: Secondary | ICD-10-CM | POA: Diagnosis not present

## 2017-06-18 DIAGNOSIS — Z0000 Encounter for general adult medical examination without abnormal findings: Secondary | ICD-10-CM

## 2017-06-18 DIAGNOSIS — Z13220 Encounter for screening for lipoid disorders: Secondary | ICD-10-CM

## 2017-06-18 LAB — CBC WITH DIFFERENTIAL/PLATELET
Basophils Absolute: 0.1 10*3/uL (ref 0.0–0.1)
Basophils Relative: 1 % (ref 0.0–3.0)
EOS PCT: 4.3 % (ref 0.0–5.0)
Eosinophils Absolute: 0.2 10*3/uL (ref 0.0–0.7)
HCT: 38.6 % (ref 36.0–46.0)
HEMOGLOBIN: 13.1 g/dL (ref 12.0–15.0)
LYMPHS ABS: 1.4 10*3/uL (ref 0.7–4.0)
Lymphocytes Relative: 26.5 % (ref 12.0–46.0)
MCHC: 34 g/dL (ref 30.0–36.0)
MCV: 96.5 fl (ref 78.0–100.0)
MONO ABS: 0.3 10*3/uL (ref 0.1–1.0)
MONOS PCT: 6 % (ref 3.0–12.0)
NEUTROS PCT: 62.2 % (ref 43.0–77.0)
Neutro Abs: 3.3 10*3/uL (ref 1.4–7.7)
Platelets: 150 10*3/uL (ref 150.0–400.0)
RBC: 4 Mil/uL (ref 3.87–5.11)
RDW: 13.5 % (ref 11.5–15.5)
WBC: 5.3 10*3/uL (ref 4.0–10.5)

## 2017-06-18 LAB — COMPREHENSIVE METABOLIC PANEL
ALBUMIN: 3.8 g/dL (ref 3.5–5.2)
ALK PHOS: 45 U/L (ref 39–117)
ALT: 10 U/L (ref 0–35)
AST: 15 U/L (ref 0–37)
BILIRUBIN TOTAL: 0.5 mg/dL (ref 0.2–1.2)
BUN: 21 mg/dL (ref 6–23)
CO2: 30 mEq/L (ref 19–32)
Calcium: 9 mg/dL (ref 8.4–10.5)
Chloride: 106 mEq/L (ref 96–112)
Creatinine, Ser: 0.65 mg/dL (ref 0.40–1.20)
GFR: 93.91 mL/min (ref >60.00)
GLUCOSE: 83 mg/dL (ref 70–99)
Potassium: 3.7 mEq/L (ref 3.5–5.1)
SODIUM: 141 meq/L (ref 135–145)
TOTAL PROTEIN: 6.2 g/dL (ref 6.0–8.3)

## 2017-06-18 LAB — VITAMIN D 25 HYDROXY (VIT D DEFICIENCY, FRACTURES): VITD: 27.97 ng/mL — AB (ref 30.00–100.00)

## 2017-06-18 LAB — LIPID PANEL
CHOLESTEROL: 187 mg/dL (ref 0–200)
HDL: 100.5 mg/dL (ref >39.00)
LDL Cholesterol: 75 mg/dL (ref 0–99)
NONHDL: 86.7
Total CHOL/HDL Ratio: 2
Triglycerides: 60 mg/dL (ref 0.0–149.0)
VLDL: 12 mg/dL (ref 0.0–40.0)

## 2017-06-18 LAB — TSH: TSH: 2.3 u[IU]/mL (ref 0.35–4.50)

## 2017-06-18 NOTE — Patient Instructions (Signed)
Kathy [name] , Thank you for taking time to come for your Medicare Wellness Visit. I appreciate your ongoing commitment to your health goals. Please review the following plan we discussed and let me know if I can assist you in the future.   These are the goals we discussed: Goals    . Increase physical activity          Starting 06/18/2017, I will continue to walk on treadmill for 30 min 3 days and to walk 2-3 miles daily as weather permits.        This is a list of the screening recommended for you and due dates:  Health Maintenance  Topic Date Due  . Mammogram  04/24/2018  . Tetanus Vaccine  06/04/2023  . Flu Shot  Completed  . DEXA scan (bone density measurement)  Completed  . Pneumonia vaccines  Completed   Preventive Care for Adults  A healthy lifestyle and preventive care can promote health and wellness. Preventive health guidelines for adults include the following key practices.  . A routine yearly physical is a good way to check with your health care provider about your health and preventive screening. It is a chance to share any concerns and updates on your health and to receive a thorough exam.  . Visit your dentist for a routine exam and preventive care every 6 months. Brush your teeth twice a day and floss once a day. Good oral hygiene prevents tooth decay and gum disease.  . The frequency of eye exams is based on your age, health, family medical history, use  of contact lenses, and other factors. Follow your health care provider's ecommendations for frequency of eye exams.  . Eat a healthy diet. Foods like vegetables, fruits, whole grains, low-fat dairy products, and lean protein foods contain the nutrients you need without too many calories. Decrease your intake of foods high in solid fats, added sugars, and salt. Eat the right amount of calories for you. Get information about a proper diet from your health care provider, if necessary.  . Regular physical exercise is one  of the most important things you can do for your health. Most adults should get at least 150 minutes of moderate-intensity exercise (any activity that increases your heart rate and causes you to sweat) each week. In addition, most adults need muscle-strengthening exercises on 2 or more days a week.  Silver Sneakers may be a benefit available to you. To determine eligibility, you may visit the website: www.silversneakers.com or contact program at (747)157-5420 Mon-Fri between 8AM-8PM.   . Maintain a healthy weight. The body mass index (BMI) is a screening tool to identify possible weight problems. It provides an estimate of body fat based on height and weight. Your health care provider can find your BMI and can help you achieve or maintain a healthy weight.   For adults 20 years and older: ? A BMI below 18.5 is considered underweight. ? A BMI of 18.5 to 24.9 is normal. ? A BMI of 25 to 29.9 is considered overweight. ? A BMI of 30 and above is considered obese.   . Maintain normal blood lipids and cholesterol levels by exercising and minimizing your intake of saturated fat. Eat a balanced diet with plenty of fruit and vegetables. Blood tests for lipids and cholesterol should begin at age 19 and be repeated every 5 years. If your lipid or cholesterol levels are high, you are over 50, or you are at high risk for heart disease,  you may need your cholesterol levels checked more frequently. Ongoing high lipid and cholesterol levels should be treated with medicines if diet and exercise are not working.  . If you smoke, find out from your health care provider how to quit. If you do not use tobacco, please do not start.  . If you choose to drink alcohol, please do not consume more than 2 drinks per day. One drink is considered to be 12 ounces (355 mL) of beer, 5 ounces (148 mL) of wine, or 1.5 ounces (44 mL) of liquor.  . If you are 22-28 years old, ask your health care provider if you should take aspirin  to prevent strokes.  . Use sunscreen. Apply sunscreen liberally and repeatedly throughout the day. You should seek shade when your shadow is shorter than you. Protect yourself by wearing long sleeves, pants, a wide-brimmed hat, and sunglasses year round, whenever you are outdoors.  . Once a month, do a whole body skin exam, using a mirror to look at the skin on your back. Tell your health care provider of new moles, moles that have irregular borders, moles that are larger than a pencil eraser, or moles that have changed in shape or color.

## 2017-06-18 NOTE — Progress Notes (Signed)
I reviewed health advisor's note, was available for consultation, and agree with documentation and plan.  

## 2017-06-18 NOTE — Progress Notes (Signed)
Subjective:   Kathy [name] is a 76 y.o. female who presents for Medicare Annual (Subsequent) preventive examination.  Review of Systems:  N/A Cardiac Risk Factors include: advanced age (>90men, >80 women)     Objective:     Vitals: BP 96/60 (BP Location: Right Arm, Patient Position: Sitting, Cuff Size: Normal)   Pulse (!) 58   Temp (!) 97.4 F (36.3 C) (Oral)   Ht 5\' 5"  (1.651 m) Comment: no shoes  Wt 108 lb 8 oz (49.2 kg)   SpO2 100%   BMI 18.06 kg/m   Body mass index is 18.06 kg/m.   Tobacco History  Smoking Status  . Never Smoker  Smokeless Tobacco  . Never Used     Counseling given: No   Past Medical History:  Diagnosis Date  . Cancer ([place])    Hx of breast CA  . Glaucoma   . Hyperlipidemia   . Osteoporosis    Past Surgical History:  Procedure Laterality Date  . BREAST SURGERY  04/06/2001   mastectomy - left side  . DILATION AND CURETTAGE OF UTERUS    . EYE SURGERY  2010   cataract - left  . MASTECTOMY  03/2001   left  . TONSILLECTOMY     Family History  Problem Relation Age of Onset  . Heart disease Mother        CHF  . Hypertension Mother   . Stroke Father   . Hypertension Father   . Osteoporosis Father   . Heart disease Father        CHF  . Breast cancer Cousin   . Colon cancer Neg Hx    History  Sexual Activity  . Sexual activity: Not on file    Outpatient Encounter Prescriptions as of 06/18/2017  Medication Sig  . aspirin 81 MG tablet Take 81 mg by mouth daily.    . bimatoprost (LUMIGAN) 0.03 % ophthalmic solution Place 1 drop into both eyes at bedtime.  . Brinzolamide-Brimonidine (SIMBRINZA OP) Place 1 drop into both eyes 2 (two) times daily.  . Calcium Carbonate-Vitamin D (CALCIUM-VITAMIN D3) 600-125 MG-UNIT TABS Take by mouth.  . cyanocobalamin 1000 MCG tablet Take 100 mcg by mouth daily.  . cyclobenzaprine (FLEXERIL) 5 MG tablet Take 2 tablets (10 mg total) by mouth at bedtime as needed for muscle spasms.  .  dorzolamide-timolol (COSOPT) 22.3-6.8 MG/ML ophthalmic solution Place 22.3 mLs into both eyes 2 (two) times daily.  . fish oil-omega-3 fatty acids 1000 MG capsule Take 1 g by mouth daily.   . Multiple Vitamin (MULTIVITAMIN) capsule Take 1 capsule by mouth daily.    . raloxifene (EVISTA) 60 MG tablet Take 1 tablet (60 mg total) by mouth daily.   No facility-administered encounter medications on file as of 06/18/2017.     Activities of Daily Living In your present state of health, do you have any difficulty performing the following activities: 06/18/2017  Hearing? Y  Vision? Y  Comment dx of glaucoma  Difficulty concentrating or making decisions? N  Walking or climbing stairs? N  Dressing or bathing? N  Doing errands, shopping? N  Preparing Food and eating ? N  Using the Toilet? N  In the past six months, have you accidently leaked urine? N  Do you have problems with loss of bowel control? N  Managing your Medications? N  Managing your Finances? N  Housekeeping or managing your Housekeeping? N  Some recent data might be hidden    Patient Care  Team: Tower, Wynelle Fanny, MD as PCP - General    Assessment:     Hearing Screening   125Hz  250Hz  500Hz  1000Hz  2000Hz  3000Hz  4000Hz  6000Hz  8000Hz   Right ear:   0 0 0  0    Left ear:   0 0 0  0    Vision Screening Comments: Last vision exam in May 2018 with Dr. Wallace Going   Exercise Activities and Dietary recommendations Current Exercise Habits: Home exercise routine, Type of exercise: walking;treadmill, Time (Minutes): 40, Frequency (Times/Week): 7, Weekly Exercise (Minutes/Week): 280, Intensity: Moderate, Exercise limited by: None identified  Goals    . Increase physical activity          Starting 06/18/2017, I will continue to walk on treadmill for 30 min 3 days and to walk 2-3 miles daily as weather permits.       Fall Risk Fall Risk  06/18/2017 09/05/2016 06/13/2015 05/30/2014 05/28/2013  Falls in the past year? No No No No No  Comment -  Emmi Telephone Survey: data to providers prior to load - - -   Depression Screen PHQ 2/9 Scores 06/18/2017 06/13/2015 05/30/2014 05/28/2013  PHQ - 2 Score 0 0 0 0  PHQ- 9 Score 0 - - -     Cognitive Function MMSE - Mini Mental State Exam 06/18/2017  Orientation to time 5  Orientation to Place 5  Registration 3  Attention/ Calculation 0  Recall 3  Language- name 2 objects 0  Language- repeat 1  Language- follow 3 step command 3  Language- read & follow direction 0  Write a sentence 0  Copy design 0  Total score 20     PLEASE NOTE: A Mini-Cog screen was completed. Maximum score is 20. A value of 0 denotes this part of Folstein MMSE was not completed or the patient failed this part of the Mini-Cog screening.   Mini-Cog Screening Orientation to Time - Max 5 pts Orientation to Place - Max 5 pts Registration - Max 3 pts Recall - Max 3 pts Language Repeat - Max 1 pts Language Follow 3 Step Command - Max 3 pts     Immunization History  Administered Date(s) Administered  . Influenza, High Dose Seasonal PF 06/04/2017  . Influenza,inj,Quad PF,6+ Mos 05/30/2014, 06/13/2015, 06/17/2016  . Influenza-Unspecified 06/17/2013  . Pneumococcal Conjugate-13 05/30/2014  . Pneumococcal Polysaccharide-23 05/09/2008  . Td 02/25/2003  . Zoster 04/17/2006  . Zoster Recombinat (Shingrix) 01/14/2017   Screening Tests Health Maintenance  Topic Date Due  . MAMMOGRAM  04/24/2018  . TETANUS/TDAP  06/04/2023  . INFLUENZA VACCINE  Completed  . DEXA SCAN  Completed  . PNA vac Low Risk Adult  Completed      Plan:     I have personally reviewed and addressed the Medicare Annual Wellness questionnaire and have noted the following in the patient's chart:  A. Medical and social history B. Use of alcohol, tobacco or illicit drugs  C. Current medications and supplements D. Functional ability and status E.  Nutritional status F.  Physical activity G. Advance directives H. List of other  physicians I.  Hospitalizations, surgeries, and ER visits in previous 12 months J.  [place] to include hearing, vision, cognitive, depression L. Referrals and appointments - none  In addition, I have reviewed and discussed with patient certain preventive protocols, quality metrics, and best practice recommendations. A written personalized care plan for preventive services as well as general preventive health recommendations were provided to patient.  See attached  scanned questionnaire for additional information.   Signed,   Lindell Noe, MHA, BS, LPN Health Coach

## 2017-06-18 NOTE — Progress Notes (Signed)
Pre visit review using our clinic review tool, if applicable. No additional management support is needed unless otherwise documented below in the visit note. 

## 2017-06-18 NOTE — Progress Notes (Signed)
PCP notes:   Health maintenance:  Flu vaccine - per pt[facility]  Abnormal screenings:   Hearing - failed  Hearing Screening   125Hz  250Hz  500Hz  1000Hz  2000Hz  3000Hz  4000Hz  6000Hz  8000Hz   Right ear:   0 0 0  0    Left ear:   0 0 0  0     Patient concerns:   None  Nurse concerns:  None  Next PCP appt:   06/25/17 @ 1530

## 2017-06-25 ENCOUNTER — Ambulatory Visit (INDEPENDENT_AMBULATORY_CARE_PROVIDER_SITE_OTHER): Payer: Medicare Other | Admitting: Family Medicine

## 2017-06-25 ENCOUNTER — Encounter: Payer: Self-pay | Admitting: Family Medicine

## 2017-06-25 VITALS — BP 128/62 | HR 64 | Temp 98.4°F | Ht 65.0 in | Wt 109.5 lb

## 2017-06-25 DIAGNOSIS — L0293 Carbuncle, unspecified: Secondary | ICD-10-CM

## 2017-06-25 DIAGNOSIS — E559 Vitamin D deficiency, unspecified: Secondary | ICD-10-CM

## 2017-06-25 DIAGNOSIS — Z853 Personal history of malignant neoplasm of breast: Secondary | ICD-10-CM

## 2017-06-25 DIAGNOSIS — R42 Dizziness and giddiness: Secondary | ICD-10-CM

## 2017-06-25 DIAGNOSIS — M810 Age-related osteoporosis without current pathological fracture: Secondary | ICD-10-CM | POA: Diagnosis not present

## 2017-06-25 MED ORDER — MECLIZINE HCL 12.5 MG PO TABS: 12.5000 mg | ORAL_TABLET | Freq: Three times a day (TID) | ORAL | 0 refills | Status: AC | PRN

## 2017-06-25 MED ORDER — RALOXIFENE HCL 60 MG PO TABS: 60.0000 mg | ORAL_TABLET | Freq: Every day | ORAL | 11 refills | Status: DC

## 2017-06-25 MED ORDER — CEPHALEXIN 500 MG PO CAPS: ORAL_CAPSULE | ORAL | 0 refills | Status: DC

## 2017-06-25 NOTE — Assessment & Plan Note (Signed)
Will inc to 5000 iu daily for level in the 20s Disc imp of this to bone and overall health

## 2017-06-25 NOTE — Assessment & Plan Note (Signed)
Refilled px for keflex to take on her trip unless needed Has not had problems lately however

## 2017-06-25 NOTE — Patient Instructions (Addendum)
Increase your vitamin D3 to 5000 iu daily  Level is still low   I would like to sign you up for the cologuard program for colon cancer screen  Call and let us know if you want to get signed up

## 2017-06-25 NOTE — Assessment & Plan Note (Signed)
Continues to do well s/p tx  Mastectomy on the L  No problems  On evista one more year  Mammogram rev  Breast exam nl

## 2017-06-25 NOTE — Assessment & Plan Note (Signed)
Refilled meclizine for upcoming trip if she needs it -but no recent symptoms

## 2017-06-25 NOTE — Progress Notes (Signed)
Subjective:    Patient ID: Kathy [name], female    DOB: 02/24/1941, 76 y.o.   MRN: [id number]  HPI Here for f/u of chronic health problems   Doing well / thinks she is fine  Time is flying by- stays very busy  [facility] and goes to the gym   Wt Readings from Last 3 Encounters:  06/25/17 109 lb 8 oz (49.7 kg)  06/18/17 108 lb 8 oz (49.2 kg)  06/17/16 108 lb 8 oz (49.2 kg)  wt is stable  Has always been underweight  Eats well 3 meals per day / she does not eat large amounts  18.22 kg/m   Had amw on 9/19 Failed hearing exam - her hearing loss does not bother you  Had flu shot at [facility] Also waiting to get a 2nd shingrix   Mammogram 7/18 Personal hx of breast cancer  Self breast exam - nl/no lumps  She does not need to see surgeon any more (had a mastectomy)  dexa 9/16 OP On evista since 2014 (has one year left) H/o compression fracture  D level is 27.9 (low)- was taking 3000 iu per day  No new fractures  Wants to wait a year for next dexa   Colonoscopy 10/14  May consider cologuard -wants to read about it    Labs: Results for orders placed or performed in visit on 06/18/17  CBC with Differential/Platelet  Result Value Ref Range   WBC 5.3 4.0 - 10.5 K/uL   RBC 4.00 3.87 - 5.11 Mil/uL   Hemoglobin 13.1 12.0 - 15.0 g/dL   HCT 38.6 36.0 - 46.0 %   MCV 96.5 78.0 - 100.0 fl   MCHC 34.0 30.0 - 36.0 g/dL   RDW 13.5 11.5 - 15.5 %   Platelets 150.0 150.0 - 400.0 K/uL   Neutrophils Relative % 62.2 43.0 - 77.0 %   Lymphocytes Relative 26.5 12.0 - 46.0 %   Monocytes Relative 6.0 3.0 - 12.0 %   Eosinophils Relative 4.3 0.0 - 5.0 %   Basophils Relative 1.0 0.0 - 3.0 %   Neutro Abs 3.3 1.4 - 7.7 K/uL   Lymphs Abs 1.4 0.7 - 4.0 K/uL   Monocytes Absolute 0.3 0.1 - 1.0 K/uL   Eosinophils Absolute 0.2 0.0 - 0.7 K/uL   Basophils Absolute 0.1 0.0 - 0.1 K/uL  Comprehensive metabolic panel  Result Value Ref Range   Sodium 141 135 - 145 mEq/L   Potassium 3.7 3.5 - 5.1 mEq/L   Chloride 106 96 - 112 mEq/L   CO2 30 19 - 32 mEq/L   Glucose, Bld 83 70 - 99 mg/dL   BUN 21 6 - 23 mg/dL   Creatinine, Ser 0.65 0.40 - 1.20 mg/dL   Total Bilirubin 0.5 0.2 - 1.2 mg/dL   Alkaline Phosphatase 45 39 - 117 U/L   AST 15 0 - 37 U/L   ALT 10 0 - 35 U/L   Total Protein 6.2 6.0 - 8.3 g/dL   Albumin 3.8 3.5 - 5.2 g/dL   Calcium 9.0 8.4 - 10.5 mg/dL   GFR 93.91 >60.00 mL/min  Lipid panel  Result Value Ref Range   Cholesterol 187 0 - 200 mg/dL   Triglycerides 60.0 0.0 - 149.0 mg/dL   HDL 100.50 >39.00 mg/dL   VLDL 12.0 0.0 - 40.0 mg/dL   LDL Cholesterol 75 0 - 99 mg/dL   Total CHOL/HDL Ratio 2    NonHDL 86.70   VITAMIN D 25 Hydroxy (Vit-D  Deficiency, Fractures)  Result Value Ref Range   VITD 27.97 (L) 30.00 - 100.00 ng/mL  TSH  Result Value Ref Range   TSH 2.30 0.35 - 4.50 uIU/mL     Going to [place] in nov Needs refill of meclizine and keflex for prn   Patient Active Problem List   Diagnosis Date Noted  . Underweight 06/17/2016  . Varicose vein of leg 06/17/2016  . Vitamin D deficiency 06/17/2016  . Estrogen deficiency 06/13/2015  . Routine general medical examination at a health care facility 06/05/2015  . Cerumen impaction 05/30/2014  . Encounter for Medicare annual wellness exam 05/23/2013  . Vertigo 05/26/2012  . INTERMITTENT VERTIGO 06/26/2010  . COMPRESSION FRACTURE, L2 VERTEBRA 05/10/2009  . Recurrent boils 04/11/2008  . Screening for lipoid disorders 04/01/2007  . OVERACTIVE BLADDER 04/01/2007  . Osteoporosis 04/01/2007  . BREAST CANCER, HX OF 04/01/2007   Past Medical History:  Diagnosis Date  . Cancer ([place])    Hx of breast CA  . Glaucoma   . Hyperlipidemia   . Osteoporosis    Past Surgical History:  Procedure Laterality Date  . BREAST SURGERY  04/06/2001   mastectomy - left side  . DILATION AND CURETTAGE OF UTERUS    . EYE SURGERY  2010   cataract - left  . MASTECTOMY  03/2001   left  . TONSILLECTOMY     Social History  Substance Use  Topics  . Smoking status: Never Smoker  . Smokeless tobacco: Never Used  . Alcohol use No   Family History  Problem Relation Age of Onset  . Heart disease Mother        CHF  . Hypertension Mother   . Stroke Father   . Hypertension Father   . Osteoporosis Father   . Heart disease Father        CHF  . Breast cancer Cousin   . Colon cancer Neg Hx    No Known Allergies Current Outpatient Prescriptions on File Prior to Visit  Medication Sig Dispense Refill  . aspirin 81 MG tablet Take 81 mg by mouth daily.      . bimatoprost (LUMIGAN) 0.03 % ophthalmic solution Place 1 drop into both eyes at bedtime.    . Brinzolamide-Brimonidine (SIMBRINZA OP) Place 1 drop into both eyes 2 (two) times daily.    . Calcium Carbonate-Vitamin D (CALCIUM-VITAMIN D3) 600-125 MG-UNIT TABS Take by mouth.    . cyanocobalamin 1000 MCG tablet Take 100 mcg by mouth daily.    . cyclobenzaprine (FLEXERIL) 5 MG tablet Take 2 tablets (10 mg total) by mouth at bedtime as needed for muscle spasms. 15 tablet 0  . dorzolamide-timolol (COSOPT) 22.3-6.8 MG/ML ophthalmic solution Place 22.3 mLs into both eyes 2 (two) times daily.    . fish oil-omega-3 fatty acids 1000 MG capsule Take 1 g by mouth daily.     . Multiple Vitamin (MULTIVITAMIN) capsule Take 1 capsule by mouth daily.       No current facility-administered medications on file prior to visit.     Review of Systems  Constitutional: Negative for activity change, appetite change, fatigue, fever and unexpected weight change.  HENT: Negative for congestion, ear pain, rhinorrhea, sinus pressure and sore throat.   Eyes: Negative for pain, redness and visual disturbance.  Respiratory: Negative for cough, shortness of breath and wheezing.   Cardiovascular: Negative for chest pain and palpitations.  Gastrointestinal: Negative for abdominal pain, blood in stool, constipation and diarrhea.  Endocrine: Negative for polydipsia and  polyuria.  Genitourinary: Negative for  dysuria, frequency and urgency.  Musculoskeletal: Negative for arthralgias, back pain and myalgias.  Skin: Negative for pallor and rash.  Allergic/Immunologic: Negative for environmental allergies.  Neurological: Negative for dizziness, syncope and headaches.  Hematological: Negative for adenopathy. Does not bruise/bleed easily.  Psychiatric/Behavioral: Negative for decreased concentration and dysphoric mood. The patient is not nervous/anxious.        Objective:   Physical Exam  Constitutional: She appears well-developed and well-nourished. No distress.  Well appearing underweight elderly female (no change)   HENT:  Head: Normocephalic and atraumatic.  Right Ear: External ear normal.  Left Ear: External ear normal.  Mouth/Throat: Oropharynx is clear and moist.  Eyes: Pupils are equal, round, and reactive to light. Conjunctivae and EOM are normal. No scleral icterus.  Neck: Normal range of motion. Neck supple. No JVD present. Carotid bruit is not present. No thyromegaly present.  Cardiovascular: Normal rate, regular rhythm, normal heart sounds and intact distal pulses.  Exam reveals no gallop.   Pulmonary/Chest: Effort normal and breath sounds normal. No respiratory distress. She has no wheezes. She exhibits no tenderness.  Abdominal: Soft. Bowel sounds are normal. She exhibits no distension, no abdominal bruit and no mass. There is no tenderness.  Genitourinary: No breast swelling, tenderness, discharge or bleeding.  Musculoskeletal: Normal range of motion. She exhibits no edema or tenderness.  Mild kyphosis   Lymphadenopathy:    She has no cervical adenopathy.  Neurological: She is alert. She has normal reflexes. No cranial nerve deficit. She exhibits normal muscle tone. Coordination normal.  Skin: Skin is warm and dry. No rash noted. No erythema. No pallor.  Solar lentigines diffusely Some SKs Pt sees dermatologist yearly  Psychiatric: She has a normal mood and affect.           Assessment & Plan:   Problem List Items Addressed This Visit      Musculoskeletal and Integument   Osteoporosis - Primary    Doing well with evista (has one year left) Rev dexa  Due for 2 y recheck-pt wants to wait another year  No new fx Enc continued exercise  Inc vitD3 to 5000 iu daily       Relevant Medications   raloxifene (EVISTA) 60 MG tablet   Vitamin D, Ergocalciferol, (DRISDOL) 50000 units CAPS capsule   cholecalciferol (VITAMIN D) 1000 units tablet     Other   BREAST CANCER, HX OF    Continues to do well s/p tx  Mastectomy on the L  No problems  On evista one more year  Mammogram rev  Breast exam nl       Recurrent boils    Refilled px for keflex to take on her trip unless needed Has not had problems lately however      Relevant Medications   cephALEXin (KEFLEX) 500 MG capsule   Vertigo    Refilled meclizine for upcoming trip if she needs it -but no recent symptoms       Vitamin D deficiency    Will inc to 5000 iu daily for level in the 20s Disc imp of this to bone and overall health

## 2017-06-25 NOTE — Assessment & Plan Note (Signed)
Doing well with evista (has one year left) Rev dexa  Due for 2 y recheck-pt wants to wait another year  No new fx Enc continued exercise  Inc vitD3 to 5000 iu daily

## 2017-08-26 ENCOUNTER — Ambulatory Visit: Payer: Self-pay

## 2017-08-26 ENCOUNTER — Telehealth: Payer: Self-pay | Admitting: Family Medicine

## 2017-08-26 NOTE — Telephone Encounter (Signed)
  Reason for Disposition . Patient sounds very upset or troubled to the triager  Answer Assessment - Initial Assessment Questions 1. CONCERN: "What happened that made you call today?"     Anxiety r/t upcoming surgery 2. ANXIETY SYMPTOM SCREENING: "Can you describe how you have been feeling?"  (e.g., tense, restless, panicky, anxious, keyed up, trouble sleeping, trouble concentrating)     Anxious  3. ONSET: "How long have you been feeling this way?"     Since yesterday 4. RECURRENT: "Have you felt this way before?"  If yes: "What happened that time?" "What helped these feelings go away in the past?"      When parents died 55. RISK OF HARM - SUICIDAL IDEATION:  "Do you ever have thoughts of hurting or killing yourself?"  (e.g., yes, no, no but preoccupation with thoughts about death)   - INTENT:  "Do you have thoughts of hurting or killing yourself right NOW?" (e.g., yes, no, N/A)   - PLAN: "Do you have a specific plan for how you would do this?" (e.g., gun, knife, overdose, no plan, N/A)     no 6. RISK OF HARM - HOMICIDAL IDEATION:  "Do you ever have thoughts of hurting or killing someone else?"  (e.g., yes, no, no but preoccupation with thoughts about death)   - INTENT:  "Do you have thoughts of hurting or killing someone right NOW?" (e.g., yes, no, N/A)   - PLAN: "Do you have a specific plan for how you would do this?" (e.g., gun, knife, no plan, N/A)      no 7. FUNCTIONAL IMPAIRMENT: "How have things been going for you overall in your life? Have you had any more difficulties than usual doing your normal daily activities?"  (e.g., better, same, worse; self-care, school, work, interactions)     Fine up until yesterday when found out that husband is having surgery 8. SUPPORT: "Who is with you now?" "Who do you live with?" "Do you have family or friends nearby who you can talk to?"      Husband  Has a daughter that is big support. 9. THERAPIST: "Do you have a counselor or therapist? Name?"  No to both 10. STRESSORS: "Has there been any new stress or recent changes in your life?"       Husband is having surgery 11. CAFFEINE ABUSE: "Do you drink caffeinated beverages, and how much each day?" (e.g., coffee, tea, colas)       Colas 4 oz every once and a while  12. SUBSTANCE ABUSE: "Do you use any illegal drugs or alcohol?"       no 13. OTHER SYMPTOMS: "Do you have any other physical symptoms right now?" (e.g., chest pain, palpitations, difficulty breathing, fever)       no 14. PREGNANCY: "Is there any chance you are pregnant?" "When was your last menstrual period?"       n/a  Protocols used: ANXIETY AND PANIC ATTACK-A-AH

## 2017-08-26 NOTE — Telephone Encounter (Signed)
Copied from [place] [phone]. Topic: Quick Communication - See Telephone Encounter >> Aug 26, 2017  3:17 PM Marin Olp L wrote: Wants to know if she can start a new medication for her nerves?

## 2017-08-26 NOTE — Telephone Encounter (Signed)
Pt scheduled with [facility] 11/29

## 2017-08-27 NOTE — Telephone Encounter (Signed)
See triage note- routed back to provider

## 2017-08-27 NOTE — Telephone Encounter (Signed)
Aware- she has appt with [facility] tomorrow

## 2017-08-28 ENCOUNTER — Ambulatory Visit (INDEPENDENT_AMBULATORY_CARE_PROVIDER_SITE_OTHER): Payer: Medicare Other | Admitting: Internal Medicine

## 2017-08-28 ENCOUNTER — Encounter: Payer: Self-pay | Admitting: Internal Medicine

## 2017-08-28 VITALS — BP 118/78 | HR 64 | Temp 97.7°F | Wt 107.0 lb

## 2017-08-28 DIAGNOSIS — F418 Other specified anxiety disorders: Secondary | ICD-10-CM

## 2017-08-28 MED ORDER — HYDROXYZINE HCL 10 MG PO TABS: 10.0000 mg | ORAL_TABLET | Freq: Every day | ORAL | 0 refills | Status: DC | PRN

## 2017-08-28 NOTE — Progress Notes (Signed)
Subjective:    Patient ID: Kathy [name], female    DOB: [birth year], 76 y.o.   MRN: [id number]  HPI  Pt presents to the clinic today with c/o situational anxiety. She reports she found out a few days ago, that her husband will need to have surgery. She is very anxious about this. She goes to meet with the cardiac surgeon today to see when the surgery will occur. She would like something to take prior to his surgery to help calm her nerves. She has intermittently taken meds for anxiety in the past, but nothing consistent or long term. She denies depression, SI/HI.  Review of Systems      Past Medical History:  Diagnosis Date  . Cancer ([place])    Hx of breast CA  . Glaucoma   . Hyperlipidemia   . Osteoporosis     Current Outpatient Medications  Medication Sig Dispense Refill  . aspirin 81 MG tablet Take 81 mg by mouth daily.      . bimatoprost (LUMIGAN) 0.03 % ophthalmic solution Place 1 drop into both eyes at bedtime.    . Brinzolamide-Brimonidine (SIMBRINZA OP) Place 1 drop into both eyes 2 (two) times daily.    . Calcium Carbonate-Vitamin D (CALCIUM-VITAMIN D3) 600-125 MG-UNIT TABS Take by mouth.    . cephALEXin (KEFLEX) 500 MG capsule Take one capsule by mouth twice a day for seven days 14 capsule 0  . cholecalciferol (VITAMIN D) 1000 units tablet Take 5,000 Units by mouth daily.    . cyanocobalamin 1000 MCG tablet Take 100 mcg by mouth daily.    . cyclobenzaprine (FLEXERIL) 5 MG tablet Take 2 tablets (10 mg total) by mouth at bedtime as needed for muscle spasms. 15 tablet 0  . dorzolamide-timolol (COSOPT) 22.3-6.8 MG/ML ophthalmic solution Place 22.3 mLs into both eyes 2 (two) times daily.    . fish oil-omega-3 fatty acids 1000 MG capsule Take 1 g by mouth daily.     . Multiple Vitamin (MULTIVITAMIN) capsule Take 1 capsule by mouth daily.      . raloxifene (EVISTA) 60 MG tablet Take 1 tablet (60 mg total) by mouth daily. 30 tablet 11  . Vitamin D, Ergocalciferol, (DRISDOL) 50000  units CAPS capsule Take 50,000 Units by mouth every 7 (seven) days.     No current facility-administered medications for this visit.     No Known Allergies  Family History  Problem Relation Age of Onset  . Heart disease Mother        CHF  . Hypertension Mother   . Stroke Father   . Hypertension Father   . Osteoporosis Father   . Heart disease Father        CHF  . Breast cancer Cousin   . Colon cancer Neg Hx     Social History   Socioeconomic History  . Marital status: Married    Spouse name: Not on file  . Number of children: Not on file  . Years of education: Not on file  . Highest education level: Not on file  Social Needs  . Financial resource strain: Not on file  . Food insecurity - worry: Not on file  . Food insecurity - inability: Not on file  . Transportation needs - medical: Not on file  . Transportation needs - non-medical: Not on file  Occupational History  . Not on file  Tobacco Use  . Smoking status: Never Smoker  . Smokeless tobacco: Never Used  Substance and Sexual Activity  .  Alcohol use: No    Alcohol/week: 0.0 oz  . Drug use: No  . Sexual activity: Not on file  Other Topics Concern  . Not on file  Social History Narrative  . Not on file     Constitutional: Denies fever, malaise, fatigue, headache or abrupt weight changes.  Psych: Pt reports anxiety. Denies depression, SI/HI.  No other specific complaints in a complete review of systems (except as listed in HPI above).  Objective:   Physical Exam   BP 118/78   Pulse 64   Temp 97.7 F (36.5 C) (Oral)   Wt 107 lb (48.5 kg)   SpO2 99%   BMI 17.81 kg/m  Wt Readings from Last 3 Encounters:  08/28/17 107 lb (48.5 kg)  06/25/17 109 lb 8 oz (49.7 kg)  06/18/17 108 lb 8 oz (49.2 kg)    General: Appears her stated age, well developed, well nourished in NAD. Neurological: Alert and oriented.  Psychiatric: Mood and affect normal. Behavior is normal. Judgment and thought content normal.      BMET    Component Value Date/Time   NA 141 06/18/2017 0846   K 3.7 06/18/2017 0846   CL 106 06/18/2017 0846   CO2 30 06/18/2017 0846   GLUCOSE 83 06/18/2017 0846   BUN 21 06/18/2017 0846   CREATININE 0.65 06/18/2017 0846   CALCIUM 9.0 06/18/2017 0846   GFRNONAA 113.74 05/11/2010 0903   GFRAA 128 05/09/2008 1018    Lipid Panel     Component Value Date/Time   CHOL 187 06/18/2017 0846   TRIG 60.0 06/18/2017 0846   HDL 100.50 06/18/2017 0846   CHOLHDL 2 06/18/2017 0846   VLDL 12.0 06/18/2017 0846   LDLCALC 75 06/18/2017 0846    CBC    Component Value Date/Time   WBC 5.3 06/18/2017 0846   RBC 4.00 06/18/2017 0846   HGB 13.1 06/18/2017 0846   HGB 13.7 02/11/2011 0955   HCT 38.6 06/18/2017 0846   HCT 40.4 02/11/2011 0955   PLT 150.0 06/18/2017 0846   PLT 158 02/11/2011 0955   MCV 96.5 06/18/2017 0846   MCV 94.7 02/11/2011 0955   MCH 32.2 02/11/2011 0955   MCHC 34.0 06/18/2017 0846   RDW 13.5 06/18/2017 0846   RDW 13.7 02/11/2011 0955   LYMPHSABS 1.4 06/18/2017 0846   LYMPHSABS 1.2 02/11/2011 0955   MONOABS 0.3 06/18/2017 0846   MONOABS 0.3 02/11/2011 0955   EOSABS 0.2 06/18/2017 0846   EOSABS 0.1 02/11/2011 0955   BASOSABS 0.1 06/18/2017 0846   BASOSABS 0.0 02/11/2011 0955    Hgb A1C Lab Results  Component Value Date   HGBA1C 5.1 05/05/2007           Assessment & Plan:   Situational Anxiety:  Support offered today Discussed relaxation techniques eRx for Atarax 10 mg daily prn- sedation caution given  Return precautions discussed Webb Silversmith, NP

## 2017-08-28 NOTE — Patient Instructions (Signed)

## 2017-10-21 DIAGNOSIS — H401131 Primary open-angle glaucoma, bilateral, mild stage: Secondary | ICD-10-CM | POA: Diagnosis not present

## 2017-10-28 DIAGNOSIS — H43812 Vitreous degeneration, left eye: Secondary | ICD-10-CM | POA: Diagnosis not present

## 2018-02-09 ENCOUNTER — Ambulatory Visit (INDEPENDENT_AMBULATORY_CARE_PROVIDER_SITE_OTHER): Payer: Medicare Other | Admitting: Family Medicine

## 2018-02-09 ENCOUNTER — Encounter: Payer: Self-pay | Admitting: Family Medicine

## 2018-02-09 VITALS — BP 130/64 | HR 68 | Temp 97.8°F | Resp 16 | Ht 65.0 in | Wt 107.0 lb

## 2018-02-09 DIAGNOSIS — S39012A Strain of muscle, fascia and tendon of lower back, initial encounter: Secondary | ICD-10-CM | POA: Diagnosis not present

## 2018-02-09 MED ORDER — CYCLOBENZAPRINE HCL 5 MG PO TABS: 10.0000 mg | ORAL_TABLET | Freq: Three times a day (TID) | ORAL | 0 refills | Status: DC | PRN

## 2018-02-09 NOTE — Patient Instructions (Signed)
Can take ibuprofen 400 mg (two over the counter tablets) every 8 to 12 hours for pain/inflammation  Continue gentle stretching/ ice/ heat as needed   Muscle Strain A muscle strain is an injury that occurs when a muscle is stretched beyond its normal length. Usually a small number of muscle fibers are torn when this happens. Muscle strain is rated in degrees. First-degree strains have the least amount of muscle fiber tearing and pain. Second-degree and third-degree strains have increasingly more tearing and pain. Usually, recovery from muscle strain takes 1-2 weeks. Complete healing takes 5-6 weeks. What are the causes? Muscle strain happens when a sudden, violent force placed on a muscle stretches it too far. This may occur with lifting, sports, or a fall. What increases the risk? Muscle strain is especially common in athletes. What are the signs or symptoms? At the site of the muscle strain, there may be:  Pain.  Bruising.  Swelling.  Difficulty using the muscle due to pain or lack of normal function.  How is this diagnosed? Your health care provider will perform a physical exam and ask about your medical history. How is this treated? Often, the best treatment for a muscle strain is resting, icing, and applying cold compresses to the injured area. Follow these instructions at home:  Use the PRICE method of treatment to promote muscle healing during the first 2-3 days after your injury. The PRICE method involves: ? Protecting the muscle from being injured again. ? Restricting your activity and resting the injured body part. ? Icing your injury. To do this, put ice in a plastic bag. Place a towel between your skin and the bag. Then, apply the ice and leave it on from 15-20 minutes each hour. After the third day, switch to moist heat packs. ? Apply compression to the injured area with a splint or elastic bandage. Be careful not to wrap it too tightly. This may interfere with blood  circulation or increase swelling. ? Elevate the injured body part above the level of your heart as often as you can.  Only take over-the-counter or prescription medicines for pain, discomfort, or fever as directed by your health care provider.  Warming up prior to exercise helps to prevent future muscle strains. Contact a health care provider if:  You have increasing pain or swelling in the injured area.  You have numbness, tingling, or a significant loss of strength in the injured area. This information is not intended to replace advice given to you by your health care provider. Make sure you discuss any questions you have with your health care provider. Document Released: 09/16/2005 Document Revised: 02/22/2016 Document Reviewed: 04/15/2013 Elsevier Interactive Patient Education  2017 [employer].

## 2018-02-09 NOTE — Progress Notes (Signed)
Subjective:    Patient ID: Kathy [name], female    DOB: 01-09-1941, 77 y.o.   MRN: [id number]  HPI This is a 77 yo female who presents today with left sided back pain that started 1 week ago while exercising. Had been exercising at gym and noticed a pain in her anterior left hip and it has moved into her left side of low back. Feels like a knife is grinding into it. Pain in one place, worse with some movement. No bowel or bladder changes.  Has taken tylenol extra strength without improvement, took ibuprofen 200 last night with a little help. Slept well last night. No weakness.  Had similar episode 8/17 after picking up a heavy tree limb. Resolved with cyclobenzaprine and ibuprofen.   Past Medical History:  Diagnosis Date  . Cancer ([place])    Hx of breast CA  . Glaucoma   . Hyperlipidemia   . Osteoporosis    Past Surgical History:  Procedure Laterality Date  . BREAST SURGERY  04/06/2001   mastectomy - left side  . DILATION AND CURETTAGE OF UTERUS    . EYE SURGERY  2010   cataract - left  . MASTECTOMY  03/2001   left  . TONSILLECTOMY     Family History  Problem Relation Age of Onset  . Heart disease Mother        CHF  . Hypertension Mother   . Stroke Father   . Hypertension Father   . Osteoporosis Father   . Heart disease Father        CHF  . Breast cancer Cousin   . Colon cancer Neg Hx    Social History   Tobacco Use  . Smoking status: Never Smoker  . Smokeless tobacco: Never Used  Substance Use Topics  . Alcohol use: No    Alcohol/week: 0.0 oz  . Drug use: No      Review of Systems Per HPI    Objective:   Physical Exam  Constitutional: She is oriented to person, place, and time. She appears well-developed and well-nourished. No distress.  HENT:  Head: Normocephalic and atraumatic.  Eyes: Conjunctivae and EOM are normal.  Neck: Normal range of motion. Neck supple.  Cardiovascular: Normal rate, regular rhythm and normal heart sounds.  Pulmonary/Chest:  Effort normal and breath sounds normal.  Musculoskeletal: Normal range of motion.       Lumbar back: She exhibits normal range of motion, no tenderness, no bony tenderness and no deformity.  Negative straight leg raise.   Neurological: She is alert and oriented to person, place, and time. She displays normal reflexes. She exhibits normal muscle tone. Coordination normal.  Skin: Skin is warm and dry. She is not diaphoretic.  Psychiatric: She has a normal mood and affect. Her behavior is normal. Judgment and thought content normal.  Vitals reviewed.    BP 130/64 (BP Location: Left Arm, Patient Position: Sitting, Cuff Size: Small)   Pulse 68   Temp 97.8 F (36.6 C) (Oral)   Resp 16   Ht 5\' 5"  (1.651 m)   Wt 107 lb (48.5 kg)   SpO2 98%   BMI 17.81 kg/m  Wt Readings from Last 3 Encounters:  02/09/18 107 lb (48.5 kg)  08/28/17 107 lb (48.5 kg)  06/25/17 109 lb 8 oz (49.7 kg)       Assessment & Plan:  1. Low back strain, initial encounter - Provided written and verbal information regarding diagnosis and treatment. - can increase  ibuprofen to 400 mg every 8-12 hours prn - heat/ice prn - continued gentle stretching - cyclobenzaprine (FLEXERIL) 5 MG tablet; Take 2 tablets (10 mg total) by mouth 3 (three) times daily as needed for muscle spasms.  Dispense: 20 tablet; Refill: 0 - RTC precautions reviewed  Clarene Reamer, FNP-BC  [facility], [facility]  02/09/2018 2:28 PM

## 2018-03-23 ENCOUNTER — Other Ambulatory Visit: Payer: Self-pay | Admitting: General Surgery

## 2018-03-23 ENCOUNTER — Other Ambulatory Visit: Payer: Self-pay | Admitting: Family Medicine

## 2018-03-23 DIAGNOSIS — Z1231 Encounter for screening mammogram for malignant neoplasm of breast: Secondary | ICD-10-CM

## 2018-04-22 DIAGNOSIS — H401131 Primary open-angle glaucoma, bilateral, mild stage: Secondary | ICD-10-CM | POA: Diagnosis not present

## 2018-04-30 ENCOUNTER — Ambulatory Visit
Admission: RE | Admit: 2018-04-30 | Discharge: 2018-04-30 | Disposition: A | Discharge status: Home / Self Care | Payer: Medicare Other | Source: Ambulatory Visit | Attending: Family Medicine | Admitting: Family Medicine

## 2018-04-30 DIAGNOSIS — Z1231 Encounter for screening mammogram for malignant neoplasm of breast: Secondary | ICD-10-CM | POA: Diagnosis not present

## 2018-06-18 DIAGNOSIS — Z23 Encounter for immunization: Secondary | ICD-10-CM | POA: Diagnosis not present

## 2018-06-21 ENCOUNTER — Telehealth: Payer: Self-pay | Admitting: Family Medicine

## 2018-06-21 DIAGNOSIS — M810 Age-related osteoporosis without current pathological fracture: Secondary | ICD-10-CM

## 2018-06-21 DIAGNOSIS — Z13220 Encounter for screening for lipoid disorders: Secondary | ICD-10-CM

## 2018-06-21 DIAGNOSIS — Z0000 Encounter for general adult medical examination without abnormal findings: Secondary | ICD-10-CM

## 2018-06-21 DIAGNOSIS — E559 Vitamin D deficiency, unspecified: Secondary | ICD-10-CM

## 2018-06-21 NOTE — Telephone Encounter (Signed)
Please let pt know that her medicare may not cover the labs I ordered - (? Unsure if her [employer] may pick it up)  Before she has her blood drawn Thanks

## 2018-06-21 NOTE — Telephone Encounter (Signed)
-----   Message from Eustace Pen, LPN sent at 03/14/3793  1:12 PM EDT ----- Regarding: Labs 9/27 Lab orders needed. Thank you.  Insurance:  [employer]

## 2018-06-22 ENCOUNTER — Other Ambulatory Visit: Payer: Self-pay | Admitting: Family Medicine

## 2018-06-22 ENCOUNTER — Ambulatory Visit: Payer: Medicare Other

## 2018-06-22 NOTE — Telephone Encounter (Signed)
Thanks -please tell her she can be done with it and remove from her medicine list

## 2018-06-22 NOTE — Telephone Encounter (Signed)
Rx declined and pt notified why med removed from med list

## 2018-06-22 NOTE — Telephone Encounter (Signed)
Looks like 1st Rx was filled on 07/02/13 which is right at the 5 yrs mark, please advise

## 2018-06-26 ENCOUNTER — Ambulatory Visit (INDEPENDENT_AMBULATORY_CARE_PROVIDER_SITE_OTHER): Payer: Medicare Other

## 2018-06-26 VITALS — BP 110/70 | HR 69 | Temp 97.6°F | Ht 64.5 in | Wt 104.2 lb

## 2018-06-26 DIAGNOSIS — E559 Vitamin D deficiency, unspecified: Secondary | ICD-10-CM | POA: Diagnosis not present

## 2018-06-26 DIAGNOSIS — Z0000 Encounter for general adult medical examination without abnormal findings: Secondary | ICD-10-CM | POA: Diagnosis not present

## 2018-06-26 DIAGNOSIS — Z13220 Encounter for screening for lipoid disorders: Secondary | ICD-10-CM | POA: Diagnosis not present

## 2018-06-26 LAB — CBC WITH DIFFERENTIAL/PLATELET
Basophils Absolute: 0 10*3/uL (ref 0.0–0.1)
Basophils Relative: 0.9 % (ref 0.0–3.0)
EOS PCT: 3.7 % (ref 0.0–5.0)
Eosinophils Absolute: 0.2 10*3/uL (ref 0.0–0.7)
HEMATOCRIT: 36.6 % (ref 36.0–46.0)
Hemoglobin: 12.6 g/dL (ref 12.0–15.0)
LYMPHS ABS: 1.4 10*3/uL (ref 0.7–4.0)
LYMPHS PCT: 27.3 % (ref 12.0–46.0)
MCHC: 34.5 g/dL (ref 30.0–36.0)
MCV: 94 fl (ref 78.0–100.0)
Monocytes Absolute: 0.4 10*3/uL (ref 0.1–1.0)
Monocytes Relative: 8.3 % (ref 3.0–12.0)
NEUTROS ABS: 3.1 10*3/uL (ref 1.4–7.7)
Neutrophils Relative %: 59.8 % (ref 43.0–77.0)
PLATELETS: 152 10*3/uL (ref 150.0–400.0)
RBC: 3.89 Mil/uL (ref 3.87–5.11)
RDW: 13.6 % (ref 11.5–15.5)
WBC: 5.2 10*3/uL (ref 4.0–10.5)

## 2018-06-26 LAB — COMPREHENSIVE METABOLIC PANEL
ALT: 9 U/L (ref 0–35)
AST: 14 U/L (ref 0–37)
Albumin: 4.1 g/dL (ref 3.5–5.2)
Alkaline Phosphatase: 46 U/L (ref 39–117)
BUN: 24 mg/dL — AB (ref 6–23)
CO2: 31 meq/L (ref 19–32)
Calcium: 9.3 mg/dL (ref 8.4–10.5)
Chloride: 106 mEq/L (ref 96–112)
Creatinine, Ser: 0.68 mg/dL (ref 0.40–1.20)
GFR: 88.9 mL/min (ref >60.00)
GLUCOSE: 91 mg/dL (ref 70–99)
Potassium: 4.1 mEq/L (ref 3.5–5.1)
SODIUM: 142 meq/L (ref 135–145)
TOTAL PROTEIN: 6.4 g/dL (ref 6.0–8.3)
Total Bilirubin: 0.7 mg/dL (ref 0.2–1.2)

## 2018-06-26 LAB — LIPID PANEL
CHOL/HDL RATIO: 2
Cholesterol: 169 mg/dL (ref 0–200)
HDL: 80.1 mg/dL (ref >39.00)
LDL Cholesterol: 78 mg/dL (ref 0–99)
NONHDL: 89.23
Triglycerides: 56 mg/dL (ref 0.0–149.0)
VLDL: 11.2 mg/dL (ref 0.0–40.0)

## 2018-06-26 LAB — VITAMIN D 25 HYDROXY (VIT D DEFICIENCY, FRACTURES): VITD: 62.33 ng/mL (ref 30.00–100.00)

## 2018-06-26 LAB — TSH: TSH: 1.47 u[IU]/mL (ref 0.35–4.50)

## 2018-06-26 NOTE — Patient Instructions (Signed)
Kathy [name] , Thank you for taking time to come for your Medicare Wellness Visit. I appreciate your ongoing commitment to your health goals. Please review the following plan we discussed and let me know if I can assist you in the future.   These are the goals we discussed: Goals    . Increase physical activity     Starting 06/26/2018, I will continue to walk on treadmill for 30 min 3 days and to walk 2-3 miles daily as weather permits.        This is a list of the screening recommended for you and due dates:  Health Maintenance  Topic Date Due  . Mammogram  05/01/2019  . Tetanus Vaccine  06/04/2023  . Flu Shot  Completed  . DEXA scan (bone density measurement)  Completed  . Pneumonia vaccines  Completed    Preventive Care for Adults  A healthy lifestyle and preventive care can promote health and wellness. Preventive health guidelines for adults include the following key practices.  . A routine yearly physical is a good way to check with your health care provider about your health and preventive screening. It is a chance to share any concerns and updates on your health and to receive a thorough exam.  . Visit your dentist for a routine exam and preventive care every 6 months. Brush your teeth twice a day and floss once a day. Good oral hygiene prevents tooth decay and gum disease.  . The frequency of eye exams is based on your age, health, family medical history, use  of contact lenses, and other factors. Follow your health care provider's recommendations for frequency of eye exams.  . Eat a healthy diet. Foods like vegetables, fruits, whole grains, low-fat dairy products, and lean protein foods contain the nutrients you need without too many calories. Decrease your intake of foods high in solid fats, added sugars, and salt. Eat the right amount of calories for you. Get information about a proper diet from your health care provider, if necessary.  . Regular physical exercise is one of  the most important things you can do for your health. Most adults should get at least 150 minutes of moderate-intensity exercise (any activity that increases your heart rate and causes you to sweat) each week. In addition, most adults need muscle-strengthening exercises on 2 or more days a week.  Silver Sneakers may be a benefit available to you. To determine eligibility, you may visit the website: www.silversneakers.com or contact program at 706-219-1096 Mon-Fri between 8AM-8PM.   . Maintain a healthy weight. The body mass index (BMI) is a screening tool to identify possible weight problems. It provides an estimate of body fat based on height and weight. Your health care provider can find your BMI and can help you achieve or maintain a healthy weight.   For adults 20 years and older: ? A BMI below 18.5 is considered underweight. ? A BMI of 18.5 to 24.9 is normal. ? A BMI of 25 to 29.9 is considered overweight. ? A BMI of 30 and above is considered obese.   . Maintain normal blood lipids and cholesterol levels by exercising and minimizing your intake of saturated fat. Eat a balanced diet with plenty of fruit and vegetables. Blood tests for lipids and cholesterol should begin at age 64 and be repeated every 5 years. If your lipid or cholesterol levels are high, you are over 50, or you are at high risk for heart disease, you may need your  cholesterol levels checked more frequently. Ongoing high lipid and cholesterol levels should be treated with medicines if diet and exercise are not working.  . If you smoke, find out from your health care provider how to quit. If you do not use tobacco, please do not start.  . If you choose to drink alcohol, please do not consume more than 2 drinks per day. One drink is considered to be 12 ounces (355 mL) of beer, 5 ounces (148 mL) of wine, or 1.5 ounces (44 mL) of liquor.  . If you are 57-62 years old, ask your health care provider if you should take aspirin to  prevent strokes.  . Use sunscreen. Apply sunscreen liberally and repeatedly throughout the day. You should seek shade when your shadow is shorter than you. Protect yourself by wearing long sleeves, pants, a wide-brimmed hat, and sunglasses year round, whenever you are outdoors.  . Once a month, do a whole body skin exam, using a mirror to look at the skin on your back. Tell your health care provider of new moles, moles that have irregular borders, moles that are larger than a pencil eraser, or moles that have changed in shape or color.

## 2018-06-26 NOTE — Progress Notes (Signed)
Subjective:   Kathy [name] is a 77 y.o. female who presents for Medicare Annual (Subsequent) preventive examination.  Review of Systems:  N/A Cardiac Risk Factors include: advanced age (>88men, >66 women)     Objective:     Vitals: BP 110/70 (BP Location: Right Arm, Patient Position: Sitting, Cuff Size: Normal)   Pulse 69   Temp 97.6 F (36.4 C) (Oral)   Ht 5' 4.5" (1.638 m) Comment: no shoes  Wt 104 lb 4 oz (47.3 kg)   SpO2 97%   BMI 17.62 kg/m   Body mass index is 17.62 kg/m.  Advanced Directives 06/26/2018 06/18/2017  Does Patient Have a Medical Advance Directive? Yes Yes  Type of Advance Directive Living will;Healthcare Power of [place];Living will  Does patient want to make changes to medical advance directive? No - Patient declined -  Copy of [place] in Chart? No - copy requested No - copy requested    Tobacco Social History   Tobacco Use  Smoking Status Never Smoker  Smokeless Tobacco Never Used     Counseling given: No   Clinical Intake:  Pre-visit preparation completed: Yes  Pain : No/denies pain Pain Score: 0-No pain     Nutritional Status: BMI of 19-24  Normal Nutritional Risks: None Diabetes: No  How often do you need to have someone help you when you read instructions, pamphlets, or other written materials from your doctor or pharmacy?: 1 - Never What is the last grade level you completed in school?: 12th grade + 1 yr college  Interpreter Needed?: No  Comments: pt lives with spouse Information entered by :: LPinson, LPN  Past Medical History:  Diagnosis Date  . Cancer ([place])    Hx of breast CA  . Glaucoma   . Hyperlipidemia   . Osteoporosis    Past Surgical History:  Procedure Laterality Date  . BREAST SURGERY  04/06/2001   mastectomy - left side  . DILATION AND CURETTAGE OF UTERUS    . EYE SURGERY  2010   cataract - left  . MASTECTOMY  03/2001   left  . TONSILLECTOMY      Family History  Problem Relation Age of Onset  . Heart disease Mother        CHF  . Hypertension Mother   . Stroke Father   . Hypertension Father   . Osteoporosis Father   . Heart disease Father        CHF  . Breast cancer Cousin   . Colon cancer Neg Hx    Social History   Socioeconomic History  . Marital status: Married    Spouse name: Not on file  . Number of children: Not on file  . Years of education: Not on file  . Highest education level: Not on file  Occupational History  . Not on file  Social Needs  . Financial resource strain: Not on file  . Food insecurity:    Worry: Not on file    Inability: Not on file  . Transportation needs:    Medical: Not on file    Non-medical: Not on file  Tobacco Use  . Smoking status: Never Smoker  . Smokeless tobacco: Never Used  Substance and Sexual Activity  . Alcohol use: No    Alcohol/week: 0.0 standard drinks  . Drug use: No  . Sexual activity: Not on file  Lifestyle  . Physical activity:    Days per week: Not on  file    Minutes per session: Not on file  . Stress: Not on file  Relationships  . Social connections:    Talks on phone: Not on file    Gets together: Not on file    Attends religious service: Not on file    Active member of club or organization: Not on file    Attends meetings of clubs or organizations: Not on file    Relationship status: Not on file  Other Topics Concern  . Not on file  Social History Narrative  . Not on file    Outpatient Encounter Medications as of 06/26/2018  Medication Sig  . aspirin 81 MG tablet Take 81 mg by mouth daily.    . bimatoprost (LUMIGAN) 0.03 % ophthalmic solution Place 1 drop into both eyes at bedtime.  . Brinzolamide-Brimonidine (SIMBRINZA OP) Place 1 drop into both eyes 2 (two) times daily.  . Calcium Carbonate-Vitamin D (CALCIUM-VITAMIN D3) 600-125 MG-UNIT TABS Take by mouth.  . cholecalciferol (VITAMIN D) 1000 units tablet Take 5,000 Units by mouth daily.  .  cyanocobalamin 1000 MCG tablet Take 100 mcg by mouth daily.  . dorzolamide-timolol (COSOPT) 22.3-6.8 MG/ML ophthalmic solution Place 22.3 mLs into both eyes 2 (two) times daily.  . fish oil-omega-3 fatty acids 1000 MG capsule Take 1 g by mouth daily.   . Multiple Vitamin (MULTIVITAMIN) capsule Take 1 capsule by mouth daily.    . Vitamin D, Ergocalciferol, (DRISDOL) 50000 units CAPS capsule Take 50,000 Units by mouth every 7 (seven) days.  . [DISCONTINUED] cyclobenzaprine (FLEXERIL) 5 MG tablet Take 2 tablets (10 mg total) by mouth 3 (three) times daily as needed for muscle spasms.  . [DISCONTINUED] hydrOXYzine (ATARAX/VISTARIL) 10 MG tablet Take 1 tablet (10 mg total) by mouth daily as needed.   No facility-administered encounter medications on file as of 06/26/2018.     Activities of Daily Living In your present state of health, do you have any difficulty performing the following activities: 06/26/2018  Hearing? Y  Vision? N  Difficulty concentrating or making decisions? N  Walking or climbing stairs? N  Dressing or bathing? N  Doing errands, shopping? N  Preparing Food and eating ? N  Using the Toilet? N  In the past six months, have you accidently leaked urine? N  Do you have problems with loss of bowel control? N  Managing your Medications? N  Managing your Finances? N  Housekeeping or managing your Housekeeping? N  Some recent data might be hidden    Patient Care Team: Tower, Wynelle Fanny, MD as PCP - General    Assessment:   This is a routine wellness examination for [place].   Hearing Screening   125Hz  250Hz  500Hz  1000Hz  2000Hz  3000Hz  4000Hz  6000Hz  8000Hz   Right ear:   0 0 0  0    Left ear:   0 0 0  0    Vision Screening Comments: Vision exam in Jan 2019 with Dr. Wallace Going  Exercise Activities and Dietary recommendations Current Exercise Habits: Home exercise routine;Structured exercise class(30 min 3 days per week - exercise class), Type of exercise: walking;treadmill, Time  (Minutes): 30, Frequency (Times/Week): 6, Weekly Exercise (Minutes/Week): 180, Intensity: Moderate, Exercise limited by: None identified  Goals    . Increase physical activity     Starting 06/26/2018, I will continue to walk on treadmill for 30 min 3 days and to walk 2-3 miles daily as weather permits.        Fall Risk Fall Risk  06/26/2018 06/18/2017 09/05/2016 06/13/2015 05/30/2014  Falls in the past year? No No No No No  Comment - - Emmi Telephone Survey: data to providers prior to load - -   Depression Screen PHQ 2/9 Scores 06/26/2018 06/18/2017 06/13/2015 05/30/2014  PHQ - 2 Score 0 0 0 0  PHQ- 9 Score 0 0 - -     Cognitive Function MMSE - Mini Mental State Exam 06/26/2018 06/18/2017  Orientation to time 5 5  Orientation to Place 5 5  Registration 3 3  Attention/ Calculation 0 0  Recall 3 3  Language- name 2 objects 0 0  Language- repeat 1 1  Language- follow 3 step command 3 3  Language- read & follow direction 0 0  Write a sentence 0 0  Copy design 0 0  Total score 20 20      PLEASE NOTE: A Mini-Cog screen was completed. Maximum score is 20. A value of 0 denotes this part of [place] MMSE was not completed or the patient failed this part of the Mini-Cog screening.   Mini-Cog Screening Orientation to Time - Max 5 pts Orientation to Place - Max 5 pts Registration - Max 3 pts Recall - Max 3 pts Language Repeat - Max 1 pts Language Follow 3 Step Command - Max 3 pts   Immunization History  Administered Date(s) Administered  . Influenza, High Dose Seasonal PF 06/04/2017, 06/18/2018  . Influenza,inj,Quad PF,6+ Mos 05/30/2014, 06/13/2015, 06/17/2016  . Influenza-Unspecified 06/17/2013  . Pneumococcal Conjugate-13 05/30/2014  . Pneumococcal Polysaccharide-23 05/09/2008  . Td 02/25/2003  . Zoster 04/17/2006  . Zoster Recombinat (Shingrix) 01/14/2017, 07/17/2017    Screening Tests Health Maintenance  Topic Date Due  . MAMMOGRAM  05/01/2019  . TETANUS/TDAP  06/04/2023  .  INFLUENZA VACCINE  Completed  . DEXA SCAN  Completed  . PNA vac Low Risk Adult  Completed     Plan:   I have personally reviewed, addressed, and noted the following in the patient's chart:  A. Medical and social history B. Use of alcohol, tobacco or illicit drugs  C. Current medications and supplements D. Functional ability and status E.  Nutritional status F.  Physical activity G. Advance directives H. List of other physicians I.  Hospitalizations, surgeries, and ER visits in previous 12 months J.  [place] to include hearing, vision, cognitive, depression L. Referrals and appointments - none  In addition, I have reviewed and discussed with patient certain preventive protocols, quality metrics, and best practice recommendations. A written personalized care plan for preventive services as well as general preventive health recommendations were provided to patient.  See attached scanned questionnaire for additional information.   Signed,   Lindell Noe, MHA, BS, LPN Health Coach

## 2018-06-26 NOTE — Progress Notes (Signed)
PCP notes:   Health maintenance:  No gaps identified.   Abnormal screenings:   Hearing - failed  Hearing Screening   125Hz  250Hz  500Hz  1000Hz  2000Hz  3000Hz  4000Hz  6000Hz  8000Hz   Right ear:   0 0 0  0    Left ear:   0 0 0  0     Patient concerns:   None  Nurse concerns:  None  Next PCP appt:   06/29/18 @ 1430  I reviewed health advisor's note, was available for consultation, and agree with documentation and plan. Loura Pardon MD

## 2018-06-29 ENCOUNTER — Encounter: Payer: Self-pay | Admitting: Family Medicine

## 2018-06-29 ENCOUNTER — Telehealth: Payer: Self-pay | Admitting: *Deleted

## 2018-06-29 ENCOUNTER — Ambulatory Visit (INDEPENDENT_AMBULATORY_CARE_PROVIDER_SITE_OTHER): Payer: Medicare Other | Admitting: Family Medicine

## 2018-06-29 VITALS — BP 130/68 | HR 75 | Temp 98.3°F | Ht 64.5 in | Wt 103.8 lb

## 2018-06-29 DIAGNOSIS — R636 Underweight: Secondary | ICD-10-CM

## 2018-06-29 DIAGNOSIS — M810 Age-related osteoporosis without current pathological fracture: Secondary | ICD-10-CM | POA: Diagnosis not present

## 2018-06-29 DIAGNOSIS — E2839 Other primary ovarian failure: Secondary | ICD-10-CM | POA: Diagnosis not present

## 2018-06-29 DIAGNOSIS — Z1211 Encounter for screening for malignant neoplasm of colon: Secondary | ICD-10-CM | POA: Insufficient documentation

## 2018-06-29 DIAGNOSIS — H6121 Impacted cerumen, right ear: Secondary | ICD-10-CM

## 2018-06-29 DIAGNOSIS — Z13220 Encounter for screening for lipoid disorders: Secondary | ICD-10-CM | POA: Diagnosis not present

## 2018-06-29 DIAGNOSIS — H9190 Unspecified hearing loss, unspecified ear: Secondary | ICD-10-CM | POA: Insufficient documentation

## 2018-06-29 DIAGNOSIS — I8391 Asymptomatic varicose veins of right lower extremity: Secondary | ICD-10-CM | POA: Diagnosis not present

## 2018-06-29 DIAGNOSIS — E559 Vitamin D deficiency, unspecified: Secondary | ICD-10-CM | POA: Diagnosis not present

## 2018-06-29 DIAGNOSIS — H903 Sensorineural hearing loss, bilateral: Secondary | ICD-10-CM

## 2018-06-29 MED ORDER — MECLIZINE HCL 25 MG PO TABS: 25.0000 mg | ORAL_TABLET | Freq: Three times a day (TID) | ORAL | 0 refills | Status: DC | PRN

## 2018-06-29 NOTE — Assessment & Plan Note (Signed)
R ear  Recommend use of debrox twice weekly for 4 or more weeks F/u for irrigation if needed

## 2018-06-29 NOTE — Telephone Encounter (Addendum)
Pt said she forgot to ask you to write an order for her to get a medical Bra. Pt said she had the mastectomy on her left breast but the bra she gets can be used for both sides. Pt said if you could write the order then she is okay with me just mailing it to her and she take the order to [facility] in [place]

## 2018-06-29 NOTE — Assessment & Plan Note (Signed)
Finished 5 y of evista  No fractures since last compression fracture  Disc fall precautions  Vit D level is improved at 62 Enc exercise

## 2018-06-29 NOTE — Assessment & Plan Note (Signed)
Pt is not ready for audiology ref yet-but closer  Will call when she desires the referral

## 2018-06-29 NOTE — Assessment & Plan Note (Signed)
Encouraged healthy calorie intake (protein) for weight gain  Pt has always struggled to keep on weight  States she eats well

## 2018-06-29 NOTE — Assessment & Plan Note (Signed)
Good cholesterol panel / LDL and HDL Disc goals for lipids and reasons to control them Rev last labs with pt Rev low sat fat diet in detail

## 2018-06-29 NOTE — Assessment & Plan Note (Signed)
Colonoscopy 2014 Signed up for the cologuard kit

## 2018-06-29 NOTE — Assessment & Plan Note (Signed)
No changes on exam / skin intact  Enc her to wear supp hose when active as much as possible

## 2018-06-29 NOTE — Assessment & Plan Note (Signed)
Level of 62- improved Vitamin D level is therapeutic with current supplementation Disc importance of this to bone and overall health

## 2018-06-29 NOTE — Telephone Encounter (Signed)
Patient called back requesting a script for 2 medical bras.  Thanks!

## 2018-06-29 NOTE — Patient Instructions (Addendum)
Add calories when you can to keep from loosing weight  Lots of protein (eggs/meat/dairy/nuts/peanut butter/ dried beans)  More fat also - like avocados   Let me know when you are ready for an audiology referral for hearing   We will sign you up for the cologuard program for screening   Get Debrox over the counter-use it in your right ear several times a week and then we can flush it out in a month or two - call for an appointment when you are ready   We will refer you for a bone density test in 1-2 months -stop at check out   Keep taking care of yourself

## 2018-06-29 NOTE — Progress Notes (Signed)
Subjective:    Patient ID: Kathy [name], female    DOB: Nov 11, 1940, 77 y.o.   MRN: [id number]  HPI Here for annual f/u of chronic health problems   Feeling good overall   Daughter had surgery last wed - tummy tuck  (after a big weight loss) Doing well  She has been helping her out   Wt Readings from Last 3 Encounters:  06/29/18 103 lb 12 oz (47.1 kg)  06/26/18 104 lb 4 oz (47.3 kg)  02/09/18 107 lb (48.5 kg)  underweight - aware of this  Says she eats all the time -has always had a very fast metabolism  17.53 kg/m   amw was 9/27 Failed hearing screen (she is aware of this) - ready to start process of audiology visit soon  No other concerns or gaps   Mammogram 8/19 neg Personal hx of breast cancer Self breast exam=no lumps or changes   Colonoscopy 10/14  Would like to do cologuard    dexa 9/16- OP evista since 2014-done with it  H/o compression fracture  Vit D level is good at 62 Wants to order a dexa   Flu shot 9/19   Had the shingrix series in 2018  Screening for lipids Lab Results  Component Value Date   CHOL 169 06/26/2018   CHOL 187 06/18/2017   CHOL 187 06/12/2016   Lab Results  Component Value Date   HDL 80.10 06/26/2018   HDL 100.50 06/18/2017   HDL 81.80 06/12/2016   Lab Results  Component Value Date   LDLCALC 78 06/26/2018   LDLCALC 75 06/18/2017   LDLCALC 89 06/12/2016   Lab Results  Component Value Date   TRIG 56.0 06/26/2018   TRIG 60.0 06/18/2017   TRIG 82.0 06/12/2016   Lab Results  Component Value Date   CHOLHDL 2 06/26/2018   CHOLHDL 2 06/18/2017   CHOLHDL 2 06/12/2016   Lab Results  Component Value Date   LDLDIRECT 107.3 05/24/2013   LDLDIRECT 99.8 05/19/2012   LDLDIRECT 101.6 05/17/2011  eats well - eats red meat only once per month   Other labs Results for orders placed or performed in visit on 06/26/18  VITAMIN D 25 Hydroxy (Vit-D Deficiency, Fractures)  Result Value Ref Range   VITD 62.33 30.00 - 100.00  ng/mL  TSH  Result Value Ref Range   TSH 1.47 0.35 - 4.50 uIU/mL  CBC with Differential/Platelet  Result Value Ref Range   WBC 5.2 4.0 - 10.5 K/uL   RBC 3.89 3.87 - 5.11 Mil/uL   Hemoglobin 12.6 12.0 - 15.0 g/dL   HCT 36.6 36.0 - 46.0 %   MCV 94.0 78.0 - 100.0 fl   MCHC 34.5 30.0 - 36.0 g/dL   RDW 13.6 11.5 - 15.5 %   Platelets 152.0 150.0 - 400.0 K/uL   Neutrophils Relative % 59.8 43.0 - 77.0 %   Lymphocytes Relative 27.3 12.0 - 46.0 %   Monocytes Relative 8.3 3.0 - 12.0 %   Eosinophils Relative 3.7 0.0 - 5.0 %   Basophils Relative 0.9 0.0 - 3.0 %   Neutro Abs 3.1 1.4 - 7.7 K/uL   Lymphs Abs 1.4 0.7 - 4.0 K/uL   Monocytes Absolute 0.4 0.1 - 1.0 K/uL   Eosinophils Absolute 0.2 0.0 - 0.7 K/uL   Basophils Absolute 0.0 0.0 - 0.1 K/uL  Lipid panel  Result Value Ref Range   Cholesterol 169 0 - 200 mg/dL   Triglycerides 56.0 0.0 - 149.0 mg/dL  HDL 80.10 >39.00 mg/dL   VLDL 11.2 0.0 - 40.0 mg/dL   LDL Cholesterol 78 0 - 99 mg/dL   Total CHOL/HDL Ratio 2    NonHDL 89.23   Comprehensive metabolic panel  Result Value Ref Range   Sodium 142 135 - 145 mEq/L   Potassium 4.1 3.5 - 5.1 mEq/L   Chloride 106 96 - 112 mEq/L   CO2 31 19 - 32 mEq/L   Glucose, Bld 91 70 - 99 mg/dL   BUN 24 (H) 6 - 23 mg/dL   Creatinine, Ser 0.68 0.40 - 1.20 mg/dL   Total Bilirubin 0.7 0.2 - 1.2 mg/dL   Alkaline Phosphatase 46 39 - 117 U/L   AST 14 0 - 37 U/L   ALT 9 0 - 35 U/L   Total Protein 6.4 6.0 - 8.3 g/dL   Albumin 4.1 3.5 - 5.2 g/dL   Calcium 9.3 8.4 - 10.5 mg/dL   GFR 88.90 >60.00 mL/min    Patient Active Problem List   Diagnosis Date Noted  . Hearing loss 06/29/2018  . Underweight 06/17/2016  . Varicose vein of leg 06/17/2016  . Vitamin D deficiency 06/17/2016  . Estrogen deficiency 06/13/2015  . Routine general medical examination at a health care facility 06/05/2015  . Cerumen impaction 05/30/2014  . Encounter for Medicare annual wellness exam 05/23/2013  . Vertigo 05/26/2012  .  INTERMITTENT VERTIGO 06/26/2010  . COMPRESSION FRACTURE, L2 VERTEBRA 05/10/2009  . Recurrent boils 04/11/2008  . Screening for lipoid disorders 04/01/2007  . OVERACTIVE BLADDER 04/01/2007  . Osteoporosis 04/01/2007  . BREAST CANCER, HX OF 04/01/2007   Past Medical History:  Diagnosis Date  . Cancer ([place])    Hx of breast CA  . Glaucoma   . Hyperlipidemia   . Osteoporosis    Past Surgical History:  Procedure Laterality Date  . BREAST SURGERY  04/06/2001   mastectomy - left side  . DILATION AND CURETTAGE OF UTERUS    . EYE SURGERY  2010   cataract - left  . MASTECTOMY  03/2001   left  . TONSILLECTOMY     Social History   Tobacco Use  . Smoking status: Never Smoker  . Smokeless tobacco: Never Used  Substance Use Topics  . Alcohol use: No    Alcohol/week: 0.0 standard drinks  . Drug use: No   Family History  Problem Relation Age of Onset  . Heart disease Mother        CHF  . Hypertension Mother   . Stroke Father   . Hypertension Father   . Osteoporosis Father   . Heart disease Father        CHF  . Breast cancer Cousin   . Colon cancer Neg Hx    No Known Allergies Current Outpatient Medications on File Prior to Visit  Medication Sig Dispense Refill  . aspirin 81 MG tablet Take 81 mg by mouth daily.      . bimatoprost (LUMIGAN) 0.03 % ophthalmic solution Place 1 drop into both eyes at bedtime.    . Brinzolamide-Brimonidine (SIMBRINZA OP) Place 1 drop into both eyes 2 (two) times daily.    . Calcium Carbonate-Vitamin D (CALCIUM-VITAMIN D3) 600-125 MG-UNIT TABS Take by mouth.    . cholecalciferol (VITAMIN D) 1000 units tablet Take 5,000 Units by mouth daily.    . cyanocobalamin 1000 MCG tablet Take 100 mcg by mouth daily.    . dorzolamide-timolol (COSOPT) 22.3-6.8 MG/ML ophthalmic solution Place 22.3 mLs into both eyes 2 (  two) times daily.    . fish oil-omega-3 fatty acids 1000 MG capsule Take 1 g by mouth daily.     . Multiple Vitamin (MULTIVITAMIN) capsule Take 1  capsule by mouth daily.      . Vitamin D, Ergocalciferol, (DRISDOL) 50000 units CAPS capsule Take 50,000 Units by mouth every 7 (seven) days.     No current facility-administered medications on file prior to visit.     Review of Systems  Constitutional: Negative for activity change, appetite change, fatigue, fever and unexpected weight change.  HENT: Positive for hearing loss. Negative for congestion, ear pain, rhinorrhea, sinus pressure and sore throat.   Eyes: Negative for pain, redness and visual disturbance.  Respiratory: Negative for cough, shortness of breath and wheezing.   Cardiovascular: Negative for chest pain and palpitations.  Gastrointestinal: Negative for abdominal pain, blood in stool, constipation and diarrhea.  Endocrine: Negative for polydipsia and polyuria.  Genitourinary: Negative for dysuria, frequency and urgency.  Musculoskeletal: Negative for arthralgias, back pain and myalgias.  Skin: Negative for pallor and rash.  Allergic/Immunologic: Negative for environmental allergies.  Neurological: Negative for dizziness, syncope and headaches.  Hematological: Negative for adenopathy. Does not bruise/bleed easily.  Psychiatric/Behavioral: Negative for decreased concentration and dysphoric mood. The patient is not nervous/anxious.        Objective:   Physical Exam  Constitutional: She appears well-developed and well-nourished. No distress.  Underweight and well appearing   HENT:  Head: Normocephalic and atraumatic.  Right Ear: External ear normal.  Left Ear: External ear normal.  Mouth/Throat: Oropharynx is clear and moist.  Firm cerumen impaction in R ear  Eyes: Pupils are equal, round, and reactive to light. Conjunctivae and EOM are normal. No scleral icterus.  Neck: Normal range of motion. Neck supple. No JVD present. Carotid bruit is not present. No thyromegaly present.  Cardiovascular: Normal rate, regular rhythm, normal heart sounds and intact distal pulses.  Exam reveals no gallop.  Pulmonary/Chest: Effort normal and breath sounds normal. No respiratory distress. She has no wheezes. She exhibits no tenderness. No breast tenderness, discharge or bleeding.  Abdominal: Soft. Bowel sounds are normal. She exhibits no distension, no abdominal bruit and no mass. There is no tenderness.  Genitourinary: No breast tenderness, discharge or bleeding.  Genitourinary Comments: Breast exam: No mass, nodules, thickening, tenderness, bulging, retraction, inflamation, nipple discharge or skin changes noted.  No axillary or clavicular LA.      Musculoskeletal: Normal range of motion. She exhibits no edema or tenderness.  Kyphosis   Lymphadenopathy:    She has no cervical adenopathy.  Neurological: She is alert. She has normal reflexes. She displays normal reflexes. No cranial nerve deficit. She exhibits normal muscle tone. Coordination normal.  Skin: Skin is warm and dry. No rash noted. No erythema. No pallor.  Lentigines and sks noted  Psychiatric: She has a normal mood and affect.  Pleasant and mentally sharp          Assessment & Plan:   Problem List Items Addressed This Visit      Cardiovascular and Mediastinum   Varicose vein of leg    No changes on exam / skin intact  Enc her to wear supp hose when active as much as possible         Nervous and Auditory   Cerumen impaction    R ear  Recommend use of debrox twice weekly for 4 or more weeks F/u for irrigation if needed       Hearing  loss    Pt is not ready for audiology ref yet-but closer  Will call when she desires the referral         Musculoskeletal and Integument   Osteoporosis - Primary    Finished 5 y of evista  No fractures since last compression fracture  Disc fall precautions  Vit D level is improved at 69 Enc exercise        Other   Colon cancer screening    Colonoscopy 2014 Signed up for the cologuard kit      Estrogen deficiency   Relevant Orders   DG Bone  Density   Screening for lipoid disorders    Good cholesterol panel / LDL and HDL Disc goals for lipids and reasons to control them Rev last labs with pt Rev low sat fat diet in detail       Underweight    Encouraged healthy calorie intake (protein) for weight gain  Pt has always struggled to keep on weight  States she eats well      Vitamin D deficiency    Level of 70- improved Vitamin D level is therapeutic with current supplementation Disc importance of this to bone and overall health

## 2018-06-29 NOTE — Telephone Encounter (Signed)
Do I write for bra with left sided insert? -does she use a prosthesis ?  Thanks

## 2018-06-30 NOTE — Telephone Encounter (Signed)
Order mailed per pt request

## 2018-06-30 NOTE — Telephone Encounter (Signed)
Order done and in IN box  

## 2018-06-30 NOTE — Telephone Encounter (Signed)
Pt said she does use a prosthesis but she said her surgeon just use to write Medical Bra and since she's been going to the same DME store they know exactly what to give her. Pt request order for 2 bras, and would like me to mail it to her

## 2018-07-14 DIAGNOSIS — Z1211 Encounter for screening for malignant neoplasm of colon: Secondary | ICD-10-CM | POA: Diagnosis not present

## 2018-07-14 DIAGNOSIS — Z1212 Encounter for screening for malignant neoplasm of rectum: Secondary | ICD-10-CM | POA: Diagnosis not present

## 2018-07-21 LAB — COLOGUARD: Cologuard: NEGATIVE

## 2018-07-23 ENCOUNTER — Other Ambulatory Visit: Payer: Self-pay | Admitting: *Deleted

## 2018-07-23 ENCOUNTER — Encounter: Payer: Self-pay | Admitting: *Deleted

## 2018-08-18 ENCOUNTER — Ambulatory Visit (INDEPENDENT_AMBULATORY_CARE_PROVIDER_SITE_OTHER)
Admission: RE | Admit: 2018-08-18 | Discharge: 2018-08-18 | Disposition: A | Discharge status: Home / Self Care | Payer: Medicare Other | Source: Ambulatory Visit | Attending: Family Medicine | Admitting: Family Medicine

## 2018-08-18 DIAGNOSIS — E2839 Other primary ovarian failure: Secondary | ICD-10-CM

## 2018-08-25 ENCOUNTER — Telehealth: Payer: Self-pay | Admitting: *Deleted

## 2018-08-25 NOTE — Telephone Encounter (Signed)
Left VM regarding pt's bone density results

## 2018-08-26 NOTE — Telephone Encounter (Signed)
Addressed under imaging tab

## 2018-09-03 ENCOUNTER — Telehealth: Payer: Self-pay | Admitting: *Deleted

## 2018-09-03 NOTE — Telephone Encounter (Signed)
Information has been submitted to pts insurance for verification of benefits. Awaiting response for coverage  

## 2018-10-06 NOTE — Telephone Encounter (Signed)
Response not received. Resubmitted

## 2018-10-20 DIAGNOSIS — H401131 Primary open-angle glaucoma, bilateral, mild stage: Secondary | ICD-10-CM | POA: Diagnosis not present

## 2018-10-23 NOTE — Telephone Encounter (Signed)
Verification of benefits have been processed and an approval has been received for pts prolia injection. Pts estimated cost are appx $0. This is only an estimate and cannot be confirmed until benefits are paid. Please advise pt and schedule if needed. If scheduled, once the injection is received, pls contact me back with the date it was received so that I am able to update prolia folder. Thanks    Spoke to pt and advised. She is agreeable to out of pocket charges but pt was advised she is responsible for meeting her $198 deductible, which pt states she will meet next week at another appt. States she will contact office back to schedule.

## 2018-10-27 DIAGNOSIS — H401131 Primary open-angle glaucoma, bilateral, mild stage: Secondary | ICD-10-CM | POA: Diagnosis not present

## 2018-11-30 ENCOUNTER — Telehealth: Payer: Self-pay | Admitting: Family Medicine

## 2018-11-30 NOTE — Telephone Encounter (Signed)
Pt need shot for Prolia. Please advise

## 2018-12-01 NOTE — Telephone Encounter (Signed)
Spoke with patient and let her know that we are in the process of re-organizing our Prolia process.  I will call her by the end of the week after meeting with our prolia reps to be sure appropriate scheduling of her injection occurs.   Patient verbalizes understanding and thanks me for the update.

## 2018-12-17 NOTE — Telephone Encounter (Signed)
Pt called to find out the status of her Prolia. I advised her Leafy Ro was out of the office until Monday.

## 2018-12-23 NOTE — Telephone Encounter (Signed)
Spoke with patient and explained that due to the current COVID 19 situation we are recommending to delay her injection for 3-4 weeks.    She is in agreement and will wait for [place] to contact her back in a few weeks to get rescheduled.

## 2019-01-20 ENCOUNTER — Telehealth: Payer: Self-pay

## 2019-01-20 NOTE — Telephone Encounter (Signed)
Pt said she received a form in a little packet with an enrollment card for prolia; she does not know if she should fill it out and send back in and pt is not sure what company sent her this information. Pt wants to know if Leafy Ro is aware of these type packets and should pt fill it out. Pt also wants to know when she might receive the prolia shot. Pt last spoke with [facility] 12/23/18. Pt request cb and pt understands Leafy Ro is out of office now. FYI to [place].

## 2019-01-25 NOTE — Telephone Encounter (Signed)
I am not familiar with any type of a card or packet that she is referring to.  I will forward this message to Charmaine to see if she can communicate with Kathy [name] or Merry Proud about what this "card' or "packet" may be.  And, to see if Charmaine can work on getting patient scheduled for her next injection.

## 2019-02-03 DIAGNOSIS — M216X2 Other acquired deformities of left foot: Secondary | ICD-10-CM | POA: Diagnosis not present

## 2019-02-03 DIAGNOSIS — D2371 Other benign neoplasm of skin of right lower limb, including hip: Secondary | ICD-10-CM | POA: Diagnosis not present

## 2019-02-03 DIAGNOSIS — M79671 Pain in right foot: Secondary | ICD-10-CM | POA: Diagnosis not present

## 2019-02-03 DIAGNOSIS — M79672 Pain in left foot: Secondary | ICD-10-CM | POA: Diagnosis not present

## 2019-02-03 DIAGNOSIS — M216X1 Other acquired deformities of right foot: Secondary | ICD-10-CM | POA: Diagnosis not present

## 2019-02-03 DIAGNOSIS — D2372 Other benign neoplasm of skin of left lower limb, including hip: Secondary | ICD-10-CM | POA: Diagnosis not present

## 2019-02-08 ENCOUNTER — Telehealth: Payer: Self-pay | Admitting: Family Medicine

## 2019-02-08 NOTE — Telephone Encounter (Signed)
Prolia benefits submitted for new start.

## 2019-02-08 NOTE — Telephone Encounter (Signed)
Pt describes what is a Prolia enrollment card.  Advised we will confirm whether pt needs to complete and mail in card after I speak w/Prolia rep.  Pt agrees and understands.

## 2019-02-24 DIAGNOSIS — M216X2 Other acquired deformities of left foot: Secondary | ICD-10-CM | POA: Diagnosis not present

## 2019-02-24 DIAGNOSIS — D2371 Other benign neoplasm of skin of right lower limb, including hip: Secondary | ICD-10-CM | POA: Diagnosis not present

## 2019-02-24 DIAGNOSIS — D2372 Other benign neoplasm of skin of left lower limb, including hip: Secondary | ICD-10-CM | POA: Diagnosis not present

## 2019-02-24 DIAGNOSIS — M216X1 Other acquired deformities of right foot: Secondary | ICD-10-CM | POA: Diagnosis not present

## 2019-02-26 NOTE — Telephone Encounter (Signed)
Advised pt no need to fill out Prolia enrollment card per rep.  Discussed Prolia benefits.  Pt would owe deductible,  approximately $198, if not met and $0 if met.  Pt states has met deductible.

## 2019-03-10 ENCOUNTER — Ambulatory Visit (INDEPENDENT_AMBULATORY_CARE_PROVIDER_SITE_OTHER): Payer: Medicare Other

## 2019-03-10 DIAGNOSIS — M810 Age-related osteoporosis without current pathological fracture: Secondary | ICD-10-CM | POA: Diagnosis not present

## 2019-03-10 MED ORDER — DENOSUMAB 60 MG/ML ~~LOC~~ SOSY
60.0000 mg | PREFILLED_SYRINGE | Freq: Once | SUBCUTANEOUS | Status: AC
  Administered 2019-03-10: 60 mg via SUBCUTANEOUS

## 2019-03-10 NOTE — Progress Notes (Signed)
Per orders of Dr. Tower, injection of Prolia given by Shamika Pedregon. Patient tolerated injection well.  

## 2019-03-10 NOTE — Telephone Encounter (Signed)
Pt received Prolia inj today.  

## 2019-03-15 DIAGNOSIS — S7001XA Contusion of right hip, initial encounter: Secondary | ICD-10-CM | POA: Diagnosis not present

## 2019-04-29 DIAGNOSIS — H401131 Primary open-angle glaucoma, bilateral, mild stage: Secondary | ICD-10-CM | POA: Diagnosis not present

## 2019-05-31 ENCOUNTER — Other Ambulatory Visit: Payer: Self-pay | Admitting: Family Medicine

## 2019-05-31 DIAGNOSIS — Z1231 Encounter for screening mammogram for malignant neoplasm of breast: Secondary | ICD-10-CM

## 2019-06-09 DIAGNOSIS — Z23 Encounter for immunization: Secondary | ICD-10-CM | POA: Diagnosis not present

## 2019-06-29 ENCOUNTER — Other Ambulatory Visit: Payer: Medicare Other

## 2019-07-05 ENCOUNTER — Ambulatory Visit (INDEPENDENT_AMBULATORY_CARE_PROVIDER_SITE_OTHER): Payer: Medicare Other

## 2019-07-05 ENCOUNTER — Telehealth: Payer: Self-pay | Admitting: Family Medicine

## 2019-07-05 DIAGNOSIS — Z0000 Encounter for general adult medical examination without abnormal findings: Secondary | ICD-10-CM | POA: Diagnosis not present

## 2019-07-05 DIAGNOSIS — M810 Age-related osteoporosis without current pathological fracture: Secondary | ICD-10-CM

## 2019-07-05 DIAGNOSIS — E559 Vitamin D deficiency, unspecified: Secondary | ICD-10-CM

## 2019-07-05 NOTE — Patient Instructions (Signed)
Kathy [name] , Thank you for taking time to come for your Medicare Wellness Visit. I appreciate your ongoing commitment to your health goals. Please review the following plan we discussed and let me know if I can assist you in the future.   Screening recommendations/referrals: Colonoscopy: up to date, completed 07/28/2013 Mammogram: scheduled for 07/15/2019 Bone Density: up to date, completed 08/18/2018 Recommended yearly ophthalmology/optometry visit for glaucoma screening and checkup Recommended yearly dental visit for hygiene and checkup  Vaccinations: Influenza vaccine: up to date, completed 06/09/2019 Pneumococcal vaccine: series completed Tdap vaccine: up to date, completed 06/03/2013 Shingles vaccine: series completed     Advanced directives: copy in chart  Conditions/risks identified: none  Next appointment: 07/13/2019 @ 9:30 am    Preventive Care 79 Years and Older, Female Preventive care refers to lifestyle choices and visits with your health care provider that can promote health and wellness. What does preventive care include?  A yearly physical exam. This is also called an annual well check.  Dental exams once or twice a year.  Routine eye exams. Ask your health care provider how often you should have your eyes checked.  Personal lifestyle choices, including:  Daily care of your teeth and gums.  Regular physical activity.  Eating a healthy diet.  Avoiding tobacco and drug use.  Limiting alcohol use.  Practicing safe sex.  Taking low-dose aspirin every day.  Taking vitamin and mineral supplements as recommended by your health care provider. What happens during an annual well check? The services and screenings done by your health care provider during your annual well check will depend on your age, overall health, lifestyle risk factors, and family history of disease. Counseling  Your health care provider may ask you questions about your:  Alcohol use.   Tobacco use.  Drug use.  Emotional well-being.  Home and relationship well-being.  Sexual activity.  Eating habits.  History of falls.  Memory and ability to understand (cognition).  Work and work Statistician.  Reproductive health. Screening  You may have the following tests or measurements:  Height, weight, and BMI.  Blood pressure.  Lipid and cholesterol levels. These may be checked every 5 years, or more frequently if you are over 81 years old.  Skin check.  Lung cancer screening. You may have this screening every year starting at age 59 if you have a 30-pack-year history of smoking and currently smoke or have quit within the past 15 years.  Fecal occult blood test (FOBT) of the stool. You may have this test every year starting at age 47.  Flexible sigmoidoscopy or colonoscopy. You may have a sigmoidoscopy every 5 years or a colonoscopy every 10 years starting at age 47.  Hepatitis C blood test.  Hepatitis B blood test.  Sexually transmitted disease (STD) testing.  Diabetes screening. This is done by checking your blood sugar (glucose) after you have not eaten for a while (fasting). You may have this done every 1-3 years.  Bone density scan. This is done to screen for osteoporosis. You may have this done starting at age 56.  Mammogram. This may be done every 1-2 years. Talk to your health care provider about how often you should have regular mammograms. Talk with your health care provider about your test results, treatment options, and if necessary, the need for more tests. Vaccines  Your health care provider may recommend certain vaccines, such as:  Influenza vaccine. This is recommended every year.  Tetanus, diphtheria, and acellular pertussis (Tdap, Td)  vaccine. You may need a Td booster every 10 years.  Zoster vaccine. You may need this after age 82.  Pneumococcal 13-valent conjugate (PCV13) vaccine. One dose is recommended after age 40.  Pneumococcal  polysaccharide (PPSV23) vaccine. One dose is recommended after age 85. Talk to your health care provider about which screenings and vaccines you need and how often you need them. This information is not intended to replace advice given to you by your health care provider. Make sure you discuss any questions you have with your health care provider. Document Released: 10/13/2015 Document Revised: 06/05/2016 Document Reviewed: 07/18/2015 Elsevier Interactive Patient Education  2017 [place] Prevention in the Home Falls can cause injuries. They can happen to people of all ages. There are many things you can do to make your home safe and to help prevent falls. What can I do on the outside of my home?  Regularly fix the edges of walkways and driveways and fix any cracks.  Remove anything that might make you trip as you walk through a door, such as a raised step or threshold.  Trim any bushes or trees on the path to your home.  Use bright outdoor lighting.  Clear any walking paths of anything that might make someone trip, such as rocks or tools.  Regularly check to see if handrails are loose or broken. Make sure that both sides of any steps have handrails.  Any raised decks and porches should have guardrails on the edges.  Have any leaves, snow, or ice cleared regularly.  Use sand or salt on walking paths during winter.  Clean up any spills in your garage right away. This includes oil or grease spills. What can I do in the bathroom?  Use night lights.  Install grab bars by the toilet and in the tub and shower. Do not use towel bars as grab bars.  Use non-skid mats or decals in the tub or shower.  If you need to sit down in the shower, use a plastic, non-slip stool.  Keep the floor dry. Clean up any water that spills on the floor as soon as it happens.  Remove soap buildup in the tub or shower regularly.  Attach bath mats securely with double-sided non-slip rug tape.   Do not have throw rugs and other things on the floor that can make you trip. What can I do in the bedroom?  Use night lights.  Make sure that you have a light by your bed that is easy to reach.  Do not use any sheets or blankets that are too big for your bed. They should not hang down onto the floor.  Have a firm chair that has side arms. You can use this for support while you get dressed.  Do not have throw rugs and other things on the floor that can make you trip. What can I do in the kitchen?  Clean up any spills right away.  Avoid walking on wet floors.  Keep items that you use a lot in easy-to-reach places.  If you need to reach something above you, use a strong step stool that has a grab bar.  Keep electrical cords out of the way.  Do not use floor polish or wax that makes floors slippery. If you must use wax, use non-skid floor wax.  Do not have throw rugs and other things on the floor that can make you trip. What can I do with my stairs?  Do not leave  any items on the stairs.  Make sure that there are handrails on both sides of the stairs and use them. Fix handrails that are broken or loose. Make sure that handrails are as long as the stairways.  Check any carpeting to make sure that it is firmly attached to the stairs. Fix any carpet that is loose or worn.  Avoid having throw rugs at the top or bottom of the stairs. If you do have throw rugs, attach them to the floor with carpet tape.  Make sure that you have a light switch at the top of the stairs and the bottom of the stairs. If you do not have them, ask someone to add them for you. What else can I do to help prevent falls?  Wear shoes that:  Do not have high heels.  Have rubber bottoms.  Are comfortable and fit you well.  Are closed at the toe. Do not wear sandals.  If you use a stepladder:  Make sure that it is fully opened. Do not climb a closed stepladder.  Make sure that both sides of the  stepladder are locked into place.  Ask someone to hold it for you, if possible.  Clearly mark and make sure that you can see:  Any grab bars or handrails.  First and last steps.  Where the edge of each step is.  Use tools that help you move around (mobility aids) if they are needed. These include:  Canes.  Walkers.  Scooters.  Crutches.  Turn on the lights when you go into a dark area. Replace any light bulbs as soon as they burn out.  Set up your furniture so you have a clear path. Avoid moving your furniture around.  If any of your floors are uneven, fix them.  If there are any pets around you, be aware of where they are.  Review your medicines with your doctor. Some medicines can make you feel dizzy. This can increase your chance of falling. Ask your doctor what other things that you can do to help prevent falls. This information is not intended to replace advice given to you by your health care provider. Make sure you discuss any questions you have with your health care provider. Document Released: 07/13/2009 Document Revised: 02/22/2016 Document Reviewed: 10/21/2014 Elsevier Interactive Patient Education  2017 [employer].

## 2019-07-05 NOTE — Progress Notes (Signed)
Subjective:   HAYLI TUITT is a 78 y.o. female who presents for Medicare Annual (Subsequent) preventive examination.  Review of Systems:    This visit is being conducted through telemedicine via telephone at the nurse health advisor's home address due to the COVID-19 pandemic. This patient has given me verbal consent via doximity to conduct this visit, patient states they are participating from their home address. Some vital signs may be absent or patient reported.    Patient identification: identified by name, DOB, and current address  Cardiac Risk Factors include: advanced age (>53men, >63 women)     Objective:     Vitals: There were no vitals taken for this visit.  There is no height or weight on file to calculate BMI.  Advanced Directives 07/05/2019 06/26/2018 06/18/2017  Does Patient Have a Medical Advance Directive? Yes Yes Yes  Type of Paramedic of [place];Living will Living will;Healthcare Power of [place];Living will  Does patient want to make changes to medical advance directive? - No - Patient declined -  Copy of [place] in Chart? Yes - validated most recent copy scanned in chart (See row information) No - copy requested No - copy requested    Tobacco Social History   Tobacco Use  Smoking Status Never Smoker  Smokeless Tobacco Never Used     Counseling given: Not Answered   Clinical Intake:  Pre-visit preparation completed: Yes  Pain : 0-10 Pain Score: 5  Pain Type: Chronic pain Pain Location: Arm Pain Orientation: Right Pain Descriptors / Indicators: Aching Pain Onset: More than a month ago Pain Frequency: Intermittent     Nutritional Risks: None Diabetes: No  How often do you need to have someone help you when you read instructions, pamphlets, or other written materials from your doctor or pharmacy?: 1 - Never What is the last grade level you completed in school?: some  college  Interpreter Needed?: No  Information entered by :: CJohnson, LPN  Past Medical History:  Diagnosis Date  . Cancer ([place])    Hx of breast CA  . Glaucoma   . Hyperlipidemia   . Osteoporosis    Past Surgical History:  Procedure Laterality Date  . BREAST SURGERY  04/06/2001   mastectomy - left side  . DILATION AND CURETTAGE OF UTERUS    . EYE SURGERY  2010   cataract - left  . MASTECTOMY  03/2001   left  . TONSILLECTOMY     Family History  Problem Relation Age of Onset  . Heart disease Mother        CHF  . Hypertension Mother   . Stroke Father   . Hypertension Father   . Osteoporosis Father   . Heart disease Father        CHF  . Breast cancer Cousin   . Colon cancer Neg Hx    Social History   Socioeconomic History  . Marital status: Married    Spouse name: Not on file  . Number of children: Not on file  . Years of education: Not on file  . Highest education level: Not on file  Occupational History  . Not on file  Social Needs  . Financial resource strain: Not hard at all  . Food insecurity    Worry: Never true    Inability: Never true  . Transportation needs    Medical: No    Non-medical: No  Tobacco Use  . Smoking status:  Never Smoker  . Smokeless tobacco: Never Used  Substance and Sexual Activity  . Alcohol use: No    Alcohol/week: 0.0 standard drinks  . Drug use: No  . Sexual activity: Not on file  Lifestyle  . Physical activity    Days per week: 7 days    Minutes per session: 30 min  . Stress: Not at all  Relationships  . Social Herbalist on phone: Not on file    Gets together: Not on file    Attends religious service: Not on file    Active member of club or organization: Not on file    Attends meetings of clubs or organizations: Not on file    Relationship status: Not on file  Other Topics Concern  . Not on file  Social History Narrative  . Not on file    Outpatient Encounter Medications as of 07/05/2019  Medication  Sig  . aspirin 81 MG tablet Take 81 mg by mouth daily.    . bimatoprost (LUMIGAN) 0.03 % ophthalmic solution Place 1 drop into both eyes at bedtime.  . Brinzolamide-Brimonidine (SIMBRINZA OP) Place 1 drop into both eyes 2 (two) times daily.  . Calcium Carbonate-Vitamin D (CALCIUM-VITAMIN D3) 600-125 MG-UNIT TABS Take by mouth.  . cholecalciferol (VITAMIN D) 1000 units tablet Take 5,000 Units by mouth daily.  . cyanocobalamin 1000 MCG tablet Take 100 mcg by mouth daily.  . dorzolamide-timolol (COSOPT) 22.3-6.8 MG/ML ophthalmic solution Place 22.3 mLs into both eyes 2 (two) times daily.  . fish oil-omega-3 fatty acids 1000 MG capsule Take 1 g by mouth daily.   . meclizine (ANTIVERT) 25 MG tablet Take 1 tablet (25 mg total) by mouth 3 (three) times daily as needed for dizziness (vertigo).  . Multiple Vitamin (MULTIVITAMIN) capsule Take 1 capsule by mouth daily.    . Vitamin D, Ergocalciferol, (DRISDOL) 50000 units CAPS capsule Take 50,000 Units by mouth every 7 (seven) days.   No facility-administered encounter medications on file as of 07/05/2019.     Activities of Daily Living In your present state of health, do you have any difficulty performing the following activities: 07/05/2019  Hearing? Y  Comment some hearing loss  Vision? N  Difficulty concentrating or making decisions? N  Walking or climbing stairs? N  Dressing or bathing? N  Doing errands, shopping? N  Preparing Food and eating ? N  Using the Toilet? N  In the past six months, have you accidently leaked urine? N  Do you have problems with loss of bowel control? N  Managing your Medications? N  Managing your Finances? N  Housekeeping or managing your Housekeeping? N  Some recent data might be hidden    Patient Care Team: Tower, Wynelle Fanny, MD as PCP - General    Assessment:   This is a routine wellness examination for [place].  Exercise Activities and Dietary recommendations Current Exercise Habits: Home exercise routine,  Type of exercise: walking, Time (Minutes): 30, Frequency (Times/Week): 7, Weekly Exercise (Minutes/Week): 210, Intensity: Moderate, Exercise limited by: None identified  Goals    . Increase physical activity     Starting 06/26/2018, I will continue to walk on treadmill for 30 min 3 days and to walk 2-3 miles daily as weather permits.     . Patient Stated     07/05/2019, Patient wants to continue exercising daily.        Fall Risk Fall Risk  07/05/2019 06/26/2018 06/18/2017 09/05/2016 06/13/2015  Falls in the  past year? 1 No No No No  Comment - - - Emmi Telephone Survey: data to providers prior to load -  Number falls in past yr: 0 - - - -  Injury with Fall? 0 - - - -  Risk for fall due to : Impaired balance/gait - - - -  Follow up Falls evaluation completed;Falls prevention discussed - - - -   Is the patient's home free of loose throw rugs in walkways, pet beds, electrical cords, etc?   yes      Grab bars in the bathroom? no      Handrails on the stairs?   yes      Adequate lighting?   yes  Timed Get Up and Go performed: n/a  Depression Screen PHQ 2/9 Scores 07/05/2019 06/26/2018 06/18/2017 06/13/2015  PHQ - 2 Score 0 0 0 0  PHQ- 9 Score 0 0 0 -     Cognitive Function MMSE - Mini Mental State Exam 07/05/2019 06/26/2018 06/18/2017  Orientation to time 5 5 5   Orientation to Place 5 5 5   Registration 3 3 3   Attention/ Calculation 5 0 0  Recall 3 3 3   Language- name 2 objects - 0 0  Language- repeat 1 1 1   Language- follow 3 step command - 3 3  Language- read & follow direction - 0 0  Write a sentence - 0 0  Copy design - 0 0  Total score - 20 20  Mini Cog  Mini-Cog screen was completed. Maximum score is 22. A value of 0 denotes this part of the MMSE was not completed or the patient failed this part of the Mini-Cog screening.      Immunization History  Administered Date(s) Administered  . Influenza, High Dose Seasonal PF 06/04/2017, 06/18/2018  . Influenza,inj,Quad PF,6+ Mos  05/30/2014, 06/13/2015, 06/17/2016  . Influenza-Unspecified 06/17/2013  . Pneumococcal Conjugate-13 05/30/2014  . Pneumococcal Polysaccharide-23 05/09/2008  . Td 02/25/2003  . Zoster 04/17/2006  . Zoster Recombinat (Shingrix) 01/14/2017, 07/17/2017    Qualifies for Shingles Vaccine? Series completed   Screening Tests Health Maintenance  Topic Date Due  . MAMMOGRAM  05/01/2019  . TETANUS/TDAP  06/04/2023  . INFLUENZA VACCINE  Completed  . DEXA SCAN  Completed  . PNA vac Low Risk Adult  Completed    Cancer Screenings: Lung: Low Dose CT Chest recommended if Age 16-80 years, 30 pack-year currently smoking OR have quit w/in 15years. Patient does not qualify. Breast:  Up to date on Mammogram? No, scheduled 07/15/2019  Up to date of Bone Density/Dexa? Yes, completed 08/18/2018 Colorectal: completed 07/25/2013  Additional Screenings:  Hepatitis C Screening: n/a     Plan:    Patient wants to continue exercising daily    I have personally reviewed and noted the following in the patient's chart:   . Medical and social history . Use of alcohol, tobacco or illicit drugs  . Current medications and supplements . Functional ability and status . Nutritional status . Physical activity . Advanced directives . List of other physicians . Hospitalizations, surgeries, and ER visits in previous 12 months . Vitals . Screenings to include cognitive, depression, and falls . Referrals and appointments  In addition, I have reviewed and discussed with patient certain preventive protocols, quality metrics, and best practice recommendations. A written personalized care plan for preventive services as well as general preventive health recommendations were provided to patient.     Andrez Grime, LPN  [id number]

## 2019-07-05 NOTE — Telephone Encounter (Signed)
-----   Message from Cloyd Stagers, RT sent at 06/30/2019 10:59 AM EDT ----- Regarding: Lab Orders for Tuesday 10.6.2020 Please place lab orders for Tuesday 10.6.2020, office visit for physical on Tuesday 10.13.2020 Thank you, Dyke Maes RT(R)

## 2019-07-05 NOTE — Progress Notes (Signed)
PCP notes: none  Health Maintenance: none    Abnormal Screenings: none    Patient concerns: Patient has some right arm pain that she would like to discuss with the doctor at her upcoming physical.     Nurse concerns: none    Next PCP appt.: 07/13/2019 @ 9:30 am

## 2019-07-05 NOTE — Telephone Encounter (Signed)
Kathy [name] It looks like her insurance/medicare may not pay for her labs  She will need to sign the waiver (indicating if insurance will not pay that she will)  I was told not to sign lab orders until waiver is signed  Thanks

## 2019-07-06 ENCOUNTER — Other Ambulatory Visit: Payer: Self-pay

## 2019-07-06 ENCOUNTER — Other Ambulatory Visit (INDEPENDENT_AMBULATORY_CARE_PROVIDER_SITE_OTHER): Payer: Medicare Other

## 2019-07-06 ENCOUNTER — Ambulatory Visit: Payer: Medicare Other

## 2019-07-06 DIAGNOSIS — E559 Vitamin D deficiency, unspecified: Secondary | ICD-10-CM | POA: Diagnosis not present

## 2019-07-06 DIAGNOSIS — Z0000 Encounter for general adult medical examination without abnormal findings: Secondary | ICD-10-CM

## 2019-07-06 LAB — COMPREHENSIVE METABOLIC PANEL
ALT: 10 U/L (ref 0–35)
AST: 17 U/L (ref 0–37)
Albumin: 4.1 g/dL (ref 3.5–5.2)
Alkaline Phosphatase: 53 U/L (ref 39–117)
BUN: 19 mg/dL (ref 6–23)
CO2: 29 mEq/L (ref 19–32)
Calcium: 8.9 mg/dL (ref 8.4–10.5)
Chloride: 107 mEq/L (ref 96–112)
Creatinine, Ser: 0.57 mg/dL (ref 0.40–1.20)
GFR: 102.27 mL/min (ref >60.00)
Glucose, Bld: 91 mg/dL (ref 70–99)
Potassium: 3.8 mEq/L (ref 3.5–5.1)
Sodium: 140 mEq/L (ref 135–145)
Total Bilirubin: 0.6 mg/dL (ref 0.2–1.2)
Total Protein: 6.1 g/dL (ref 6.0–8.3)

## 2019-07-06 LAB — CBC WITH DIFFERENTIAL/PLATELET
Basophils Absolute: 0 10*3/uL (ref 0.0–0.1)
Basophils Relative: 0.9 % (ref 0.0–3.0)
Eosinophils Absolute: 0.4 10*3/uL (ref 0.0–0.7)
Eosinophils Relative: 9.7 % — ABNORMAL HIGH (ref 0.0–5.0)
HCT: 39.9 % (ref 36.0–46.0)
Hemoglobin: 13.2 g/dL (ref 12.0–15.0)
Lymphocytes Relative: 30.9 % (ref 12.0–46.0)
Lymphs Abs: 1.3 10*3/uL (ref 0.7–4.0)
MCHC: 33 g/dL (ref 30.0–36.0)
MCV: 94.7 fl (ref 78.0–100.0)
Monocytes Absolute: 0.3 10*3/uL (ref 0.1–1.0)
Monocytes Relative: 7.9 % (ref 3.0–12.0)
Neutro Abs: 2.1 10*3/uL (ref 1.4–7.7)
Neutrophils Relative %: 50.6 % (ref 43.0–77.0)
Platelets: 148 10*3/uL — ABNORMAL LOW (ref 150.0–400.0)
RBC: 4.22 Mil/uL (ref 3.87–5.11)
RDW: 13.7 % (ref 11.5–15.5)
WBC: 4.2 10*3/uL (ref 4.0–10.5)

## 2019-07-06 LAB — LIPID PANEL
Cholesterol: 202 mg/dL — ABNORMAL HIGH (ref 0–200)
HDL: 84.6 mg/dL (ref >39.00)
LDL Cholesterol: 100 mg/dL — ABNORMAL HIGH (ref 0–99)
NonHDL: 117.42
Total CHOL/HDL Ratio: 2
Triglycerides: 86 mg/dL (ref 0.0–149.0)
VLDL: 17.2 mg/dL (ref 0.0–40.0)

## 2019-07-06 LAB — VITAMIN D 25 HYDROXY (VIT D DEFICIENCY, FRACTURES): VITD: 76.32 ng/mL (ref 30.00–100.00)

## 2019-07-06 LAB — TSH: TSH: 2.45 u[IU]/mL (ref 0.35–4.50)

## 2019-07-13 ENCOUNTER — Other Ambulatory Visit: Payer: Self-pay

## 2019-07-13 ENCOUNTER — Encounter: Payer: Self-pay | Admitting: Family Medicine

## 2019-07-13 ENCOUNTER — Ambulatory Visit (INDEPENDENT_AMBULATORY_CARE_PROVIDER_SITE_OTHER): Payer: Medicare Other | Admitting: Family Medicine

## 2019-07-13 VITALS — BP 128/65 | HR 61 | Temp 97.1°F | Ht 64.0 in | Wt 104.0 lb

## 2019-07-13 DIAGNOSIS — I8391 Asymptomatic varicose veins of right lower extremity: Secondary | ICD-10-CM | POA: Diagnosis not present

## 2019-07-13 DIAGNOSIS — Z13220 Encounter for screening for lipoid disorders: Secondary | ICD-10-CM | POA: Diagnosis not present

## 2019-07-13 DIAGNOSIS — Z1211 Encounter for screening for malignant neoplasm of colon: Secondary | ICD-10-CM

## 2019-07-13 DIAGNOSIS — E559 Vitamin D deficiency, unspecified: Secondary | ICD-10-CM | POA: Diagnosis not present

## 2019-07-13 DIAGNOSIS — M810 Age-related osteoporosis without current pathological fracture: Secondary | ICD-10-CM

## 2019-07-13 DIAGNOSIS — M79601 Pain in right arm: Secondary | ICD-10-CM | POA: Diagnosis not present

## 2019-07-13 DIAGNOSIS — R636 Underweight: Secondary | ICD-10-CM | POA: Diagnosis not present

## 2019-07-13 DIAGNOSIS — Z853 Personal history of malignant neoplasm of breast: Secondary | ICD-10-CM | POA: Diagnosis not present

## 2019-07-13 NOTE — Assessment & Plan Note (Signed)
Vitamin D level is therapeutic with current supplementation Disc importance of this to bone and overall health  Level of 76

## 2019-07-13 NOTE — Assessment & Plan Note (Signed)
Suspect mild shoulder tendonitis from lifting  Originally elbow- but has moved up  Most trouble with internal rotation  Recommend relative rest with rom exercises  Ice prn  Alert if no improvement

## 2019-07-13 NOTE — Assessment & Plan Note (Addendum)
Pt has her mammogram scheduled for 10/15  No change in breast exam (right)  Has been cancer free

## 2019-07-13 NOTE — Progress Notes (Signed)
Subjective:    Patient ID: Kathy [name], female    DOB: 1941-05-20, 78 y.o.   MRN: [id number]  HPI Here for annual f/u of chronic health problems   Had amw on 10/5 No gaps or concerns  cologuard neg 10/19   Mammogram 8/19  Scheduled for 10/15 Self breast exam   dexa 11/19 - OP Fosamax and evista in the past  prolia now -no problems currently  H/o compression fx of the spine  D level is good at 76 Exercise- walking  Falls-had a fall in June onto R hip-had xrays and nothing was broken Her foot caught on rug making rice pudding  Fractures -none new   R arm is bothering her  Saturday-hurts in the elbow  Woke her up on Sunday  advil helps ? Tendonitis  Hurts to reach in back/fasten bra    Wt Readings from Last 3 Encounters:  07/13/19 104 lb (47.2 kg)  06/29/18 103 lb 12 oz (47.1 kg)  06/26/18 104 lb 4 oz (47.3 kg)  eating well  Walks a lot - every day 2-3 miles  Appetite is good  Eats chicken for protein  17.85 kg/m  Stable, underweight     BP Readings from Last 3 Encounters:  07/13/19 (!) 142/78  06/29/18 130/68  06/26/18 110/70   Pulse Readings from Last 3 Encounters:  07/13/19 61  06/29/18 75  06/26/18 69   Lab Results  Component Value Date   WBC 4.2 07/06/2019   HGB 13.2 07/06/2019   HCT 39.9 07/06/2019   MCV 94.7 07/06/2019   PLT 148.0 (L) 07/06/2019   Lab Results  Component Value Date   CREATININE 0.57 07/06/2019   BUN 19 07/06/2019   NA 140 07/06/2019   K 3.8 07/06/2019   CL 107 07/06/2019   CO2 29 07/06/2019   Lab Results  Component Value Date   ALT 10 07/06/2019   AST 17 07/06/2019   ALKPHOS 53 07/06/2019   BILITOT 0.6 07/06/2019   Cholesterol screen Lab Results  Component Value Date   CHOL 202 (H) 07/06/2019   CHOL 169 06/26/2018   CHOL 187 06/18/2017   Lab Results  Component Value Date   HDL 84.60 07/06/2019   HDL 80.10 06/26/2018   HDL 100.50 06/18/2017   Lab Results  Component Value Date   LDLCALC 100 (H)  07/06/2019   LDLCALC 78 06/26/2018   LDLCALC 75 06/18/2017   Lab Results  Component Value Date   TRIG 86.0 07/06/2019   TRIG 56.0 06/26/2018   TRIG 60.0 06/18/2017   Lab Results  Component Value Date   CHOLHDL 2 07/06/2019   CHOLHDL 2 06/26/2018   CHOLHDL 2 06/18/2017   Lab Results  Component Value Date   LDLDIRECT 107.3 05/24/2013   LDLDIRECT 99.8 05/19/2012   LDLDIRECT 101.6 05/17/2011  good profile  Eats healthy  Still eats some candy bars    Thyroid Lab Results  Component Value Date   TSH 2.45 07/06/2019    Patient Active Problem List   Diagnosis Date Noted  . Right arm pain 07/13/2019  . Hearing loss 06/29/2018  . Colon cancer screening 06/29/2018  . Underweight 06/17/2016  . Varicose vein of leg 06/17/2016  . Vitamin D deficiency 06/17/2016  . Estrogen deficiency 06/13/2015  . Routine general medical examination at a health care facility 06/05/2015  . Encounter for Medicare annual wellness exam 05/23/2013  . Vertigo 05/26/2012  . INTERMITTENT VERTIGO 06/26/2010  . H/O compression fracture of spine  05/10/2009  . Recurrent boils 04/11/2008  . Screening for lipoid disorders 04/01/2007  . OVERACTIVE BLADDER 04/01/2007  . Osteoporosis 04/01/2007  . BREAST CANCER, HX OF 04/01/2007   Past Medical History:  Diagnosis Date  . Cancer ([place])    Hx of breast CA  . Glaucoma   . Hyperlipidemia   . Osteoporosis    Past Surgical History:  Procedure Laterality Date  . BREAST SURGERY  04/06/2001   mastectomy - left side  . DILATION AND CURETTAGE OF UTERUS    . EYE SURGERY  2010   cataract - left  . MASTECTOMY  03/2001   left  . TONSILLECTOMY     Social History   Tobacco Use  . Smoking status: Never Smoker  . Smokeless tobacco: Never Used  Substance Use Topics  . Alcohol use: No    Alcohol/week: 0.0 standard drinks  . Drug use: No   Family History  Problem Relation Age of Onset  . Heart disease Mother        CHF  . Hypertension Mother   . Stroke  Father   . Hypertension Father   . Osteoporosis Father   . Heart disease Father        CHF  . Breast cancer Cousin   . Colon cancer Neg Hx    No Known Allergies Current Outpatient Medications on File Prior to Visit  Medication Sig Dispense Refill  . aspirin 81 MG tablet Take 81 mg by mouth daily.      . bimatoprost (LUMIGAN) 0.03 % ophthalmic solution Place 1 drop into both eyes at bedtime.    . Brinzolamide-Brimonidine (SIMBRINZA OP) Place 1 drop into both eyes 2 (two) times daily.    . Calcium Carbonate-Vitamin D (CALCIUM-VITAMIN D3) 600-125 MG-UNIT TABS Take by mouth.    . cholecalciferol (VITAMIN D) 1000 units tablet Take 5,000 Units by mouth daily.    . cyanocobalamin 1000 MCG tablet Take 100 mcg by mouth daily.    . dorzolamide-timolol (COSOPT) 22.3-6.8 MG/ML ophthalmic solution Place 22.3 mLs into both eyes 2 (two) times daily.    . fish oil-omega-3 fatty acids 1000 MG capsule Take 1 g by mouth daily.     . Multiple Vitamin (MULTIVITAMIN) capsule Take 1 capsule by mouth daily.      . Vitamin D, Ergocalciferol, (DRISDOL) 50000 units CAPS capsule Take 50,000 Units by mouth every 7 (seven) days.    . meclizine (ANTIVERT) 25 MG tablet Take 1 tablet (25 mg total) by mouth 3 (three) times daily as needed for dizziness (vertigo). (Patient not taking: Reported on 07/13/2019) 20 tablet 0   No current facility-administered medications on file prior to visit.      Review of Systems  Constitutional: Negative for activity change, appetite change, fatigue, fever and unexpected weight change.  HENT: Negative for congestion, ear pain, rhinorrhea, sinus pressure and sore throat.   Eyes: Negative for pain, redness and visual disturbance.  Respiratory: Negative for cough, shortness of breath and wheezing.   Cardiovascular: Negative for chest pain and palpitations.       Unchanged varicose veins in legs  Gastrointestinal: Negative for abdominal pain, blood in stool, constipation and diarrhea.   Endocrine: Negative for polydipsia and polyuria.  Genitourinary: Negative for dysuria, frequency and urgency.  Musculoskeletal: Negative for arthralgias, back pain and myalgias.       R arm/shoulder pain   Skin: Negative for pallor and rash.  Allergic/Immunologic: Negative for environmental allergies.  Neurological: Negative for dizziness, syncope and  headaches.  Hematological: Negative for adenopathy. Does not bruise/bleed easily.  Psychiatric/Behavioral: Negative for decreased concentration and dysphoric mood. The patient is not nervous/anxious.        Objective:   Physical Exam Constitutional:      General: She is not in acute distress.    Appearance: Normal appearance. She is well-developed. She is not ill-appearing or diaphoretic.     Comments: Underweight - stable  HENT:     Head: Normocephalic and atraumatic.     Right Ear: Tympanic membrane, ear canal and external ear normal.     Left Ear: Tympanic membrane, ear canal and external ear normal.     Nose: Nose normal. No congestion.     Mouth/Throat:     Mouth: Mucous membranes are moist.     Pharynx: Oropharynx is clear. No posterior oropharyngeal erythema.  Eyes:     General: No scleral icterus.       Right eye: No discharge.        Left eye: No discharge.     Extraocular Movements: Extraocular movements intact.     Conjunctiva/sclera: Conjunctivae normal.     Pupils: Pupils are equal, round, and reactive to light.  Neck:     Musculoskeletal: Normal range of motion and neck supple. No neck rigidity or muscular tenderness.     Thyroid: No thyromegaly.     Vascular: No carotid bruit or JVD.  Cardiovascular:     Rate and Rhythm: Normal rate and regular rhythm.     Pulses: Normal pulses.     Heart sounds: Normal heart sounds. No gallop.   Pulmonary:     Effort: Pulmonary effort is normal. No respiratory distress.     Breath sounds: Normal breath sounds. No wheezing.     Comments: Good air exch Chest:     Chest wall:  No tenderness.  Abdominal:     General: Bowel sounds are normal. There is no distension or abdominal bruit.     Palpations: Abdomen is soft. There is no mass.     Tenderness: There is no abdominal tenderness.     Hernia: No hernia is present.  Genitourinary:    Comments: Breast exam R : No mass, nodules, thickening, tenderness, bulging, retraction, inflamation, nipple discharge or skin changes noted.  No axillary or clavicular LA.     L mastectomy site unchanged , w/o M or tenderness or skin change  Musculoskeletal: Normal range of motion.        General: No tenderness.     Right lower leg: No edema.     Left lower leg: No edema.  Lymphadenopathy:     Cervical: No cervical adenopathy.  Skin:    General: Skin is warm and dry.     Coloration: Skin is not pale.     Findings: No erythema or rash.  Neurological:     Mental Status: She is alert. Mental status is at baseline.     Cranial Nerves: No cranial nerve deficit.     Motor: No abnormal muscle tone.     Coordination: Coordination normal.     Gait: Gait normal.     Deep Tendon Reflexes: Reflexes are normal and symmetric. Reflexes normal.  Psychiatric:        Mood and Affect: Mood normal.        Cognition and Memory: Cognition and memory normal.           Assessment & Plan:   Problem List Items Addressed This Visit  Cardiovascular and Mediastinum   Varicose vein of leg    Pt wears support hose part of the time         Musculoskeletal and Integument   Osteoporosis - Primary    dexa 11/19  H/o spinal compression fx in the past  D level is tx at 27 Continues to walk No new fx One fall (tripping)  Past tx with fosamax and evista  Not on prolia and tolerating it well        Other   Screening for lipoid disorders    Good cholesterol profile HDL in 80s  LDL of 100  Disc goals for lipids and reasons to control them Rev last labs with pt Rev low sat fat diet in detail       BREAST CANCER, HX OF    Pt has  her mammogram scheduled for 10/15  No change in breast exam (right)  Has been cancer free        Underweight    Wt is stable Continue to enc pt to take in more protein calories       Vitamin D deficiency    Vitamin D level is therapeutic with current supplementation Disc importance of this to bone and overall health  Level of 76       Colon cancer screening    cologuard negative 10/19  Reassuring       Right arm pain    Suspect mild shoulder tendonitis from lifting  Originally elbow- but has moved up  Most trouble with internal rotation  Recommend relative rest with rom exercises  Ice prn  Alert if no improvement

## 2019-07-13 NOTE — Assessment & Plan Note (Signed)
Pt wears support hose part of the time

## 2019-07-13 NOTE — Assessment & Plan Note (Signed)
dexa 11/19  H/o spinal compression fx in the past  D level is tx at 87 Continues to walk No new fx One fall (tripping)  Past tx with fosamax and evista  Not on prolia and tolerating it well

## 2019-07-13 NOTE — Assessment & Plan Note (Signed)
Wt is stable Continue to enc pt to take in more protein calories

## 2019-07-13 NOTE — Assessment & Plan Note (Signed)
Good cholesterol profile HDL in 80s  LDL of 100  Disc goals for lipids and reasons to control them Rev last labs with pt Rev low sat fat diet in detail

## 2019-07-13 NOTE — Assessment & Plan Note (Signed)
cologuard negative 10/19  Reassuring

## 2019-07-13 NOTE — Patient Instructions (Addendum)
Think hard about picking up rugs   Take it easy with your right arm - no heavy lifting for the next week or two  Use ice and ibuprofen when needed   Take care of yourself  Keep your calories up

## 2019-07-15 ENCOUNTER — Ambulatory Visit
Admission: RE | Admit: 2019-07-15 | Discharge: 2019-07-15 | Disposition: A | Discharge status: Home / Self Care | Payer: Medicare Other | Source: Ambulatory Visit | Attending: Family Medicine | Admitting: Family Medicine

## 2019-07-15 ENCOUNTER — Other Ambulatory Visit: Payer: Self-pay

## 2019-07-15 DIAGNOSIS — Z1231 Encounter for screening mammogram for malignant neoplasm of breast: Secondary | ICD-10-CM

## 2019-08-05 ENCOUNTER — Telehealth: Payer: Self-pay

## 2019-08-05 NOTE — Telephone Encounter (Signed)
Discussed Prolia benefits w/pt.  Pt has MCR and sup.  Pt would owe $0.  Pt understands and agrees.

## 2019-08-23 ENCOUNTER — Telehealth: Payer: Self-pay

## 2019-08-23 MED ORDER — PREDNISONE 10 MG PO TABS: ORAL_TABLET | ORAL | 0 refills | Status: DC

## 2019-08-23 NOTE — Telephone Encounter (Signed)
We could try a short course of prednisone  (it can cause increased hunger and jitteriness)  It may work better than ibuprofen  Keep resting Ice any time she can   If no significant improvement I would schedule a sport med visit   We did eval at her last visit-thought it was most likely tendonitis   I did px- [facility] she wants to try [facility]

## 2019-08-23 NOTE — Telephone Encounter (Signed)
Pt notified of Dr. Marliss Coots comments and instructions and verbalized understanding. She will try the prednisone and keep [place] posted

## 2019-08-23 NOTE — Telephone Encounter (Signed)
Pt last seen 07/13/19 and pt had been having rt arm pain in the elbow area;? Tendonitis from lifting; pt has been resting with ROM exercise and ice and ibuprofen on and off which is not helping. Pt can reach back better still not where it needs to be. Pt wonders if there is a prescription med she can take.[facility].

## 2019-08-27 ENCOUNTER — Other Ambulatory Visit: Payer: Self-pay | Admitting: Family Medicine

## 2019-09-14 ENCOUNTER — Other Ambulatory Visit: Payer: Self-pay

## 2019-09-14 ENCOUNTER — Ambulatory Visit (INDEPENDENT_AMBULATORY_CARE_PROVIDER_SITE_OTHER): Payer: Medicare Other | Admitting: *Deleted

## 2019-09-14 DIAGNOSIS — M810 Age-related osteoporosis without current pathological fracture: Secondary | ICD-10-CM

## 2019-09-14 MED ORDER — DENOSUMAB 60 MG/ML ~~LOC~~ SOSY
60.0000 mg | PREFILLED_SYRINGE | Freq: Once | SUBCUTANEOUS | Status: AC
  Administered 2019-09-14: 14:00:00 60 mg via SUBCUTANEOUS

## 2019-09-14 NOTE — Progress Notes (Signed)
Per orders of Dr. Glori Bickers, injection of Prolia 60mg /mL SQ given by Young Brim M. Patient tolerated injection well.

## 2019-11-04 DIAGNOSIS — H401131 Primary open-angle glaucoma, bilateral, mild stage: Secondary | ICD-10-CM | POA: Diagnosis not present

## 2019-12-28 DIAGNOSIS — D2372 Other benign neoplasm of skin of left lower limb, including hip: Secondary | ICD-10-CM | POA: Diagnosis not present

## 2019-12-28 DIAGNOSIS — M79672 Pain in left foot: Secondary | ICD-10-CM | POA: Diagnosis not present

## 2019-12-28 DIAGNOSIS — M216X2 Other acquired deformities of left foot: Secondary | ICD-10-CM | POA: Diagnosis not present

## 2020-01-18 DIAGNOSIS — D2372 Other benign neoplasm of skin of left lower limb, including hip: Secondary | ICD-10-CM | POA: Diagnosis not present

## 2020-01-18 DIAGNOSIS — M79672 Pain in left foot: Secondary | ICD-10-CM | POA: Diagnosis not present

## 2020-02-01 ENCOUNTER — Telehealth: Payer: Self-pay

## 2020-02-01 NOTE — Telephone Encounter (Signed)
Pt due for Prolia 03-15-20. Pt has MCR and sup.  No PA required.  Deductible met.  Pt would owe approximately $0 for Prolia/admin fee.

## 2020-02-08 NOTE — Telephone Encounter (Signed)
Placed remind me to contact pt

## 2020-02-15 ENCOUNTER — Other Ambulatory Visit: Payer: Self-pay

## 2020-02-15 DIAGNOSIS — M810 Age-related osteoporosis without current pathological fracture: Secondary | ICD-10-CM

## 2020-02-15 NOTE — Telephone Encounter (Signed)
Contacted pt and scheduled for 6/1 for labs and for 6/16 for prolia inj. Pt had covid vaccines in Jan.  Advised if any labs were abnormal this office would contact her. Pt verbalized understanding.  Entered BMP order and placed remind me to follow results.

## 2020-02-29 ENCOUNTER — Other Ambulatory Visit: Payer: Self-pay

## 2020-02-29 ENCOUNTER — Other Ambulatory Visit (INDEPENDENT_AMBULATORY_CARE_PROVIDER_SITE_OTHER): Payer: Medicare Other

## 2020-02-29 DIAGNOSIS — M810 Age-related osteoporosis without current pathological fracture: Secondary | ICD-10-CM

## 2020-02-29 LAB — BASIC METABOLIC PANEL
BUN: 18 mg/dL (ref 6–23)
CO2: 31 mEq/L (ref 19–32)
Calcium: 9.1 mg/dL (ref 8.4–10.5)
Chloride: 106 mEq/L (ref 96–112)
Creatinine, Ser: 0.53 mg/dL (ref 0.40–1.20)
GFR: 111.04 mL/min (ref >60.00)
Glucose, Bld: 91 mg/dL (ref 70–99)
Potassium: 4 mEq/L (ref 3.5–5.1)
Sodium: 139 mEq/L (ref 135–145)

## 2020-03-01 NOTE — Telephone Encounter (Signed)
Ca normal and CrCl is 64.74mL/min.  Ok to proceed with inj.  Tower, Wynelle Fanny, MD  02/29/2020 7:25 PM EDT    Labs ok for prolia

## 2020-03-15 ENCOUNTER — Ambulatory Visit (INDEPENDENT_AMBULATORY_CARE_PROVIDER_SITE_OTHER): Payer: Medicare Other | Admitting: *Deleted

## 2020-03-15 DIAGNOSIS — M810 Age-related osteoporosis without current pathological fracture: Secondary | ICD-10-CM | POA: Diagnosis not present

## 2020-03-15 MED ORDER — DENOSUMAB 60 MG/ML ~~LOC~~ SOSY
60.0000 mg | PREFILLED_SYRINGE | Freq: Once | SUBCUTANEOUS | Status: AC
  Administered 2020-03-15: 60 mg via SUBCUTANEOUS

## 2020-03-15 NOTE — Progress Notes (Signed)
Per orders of Dr. Tower, injection of Prolia given by Lonnell Chaput M. Patient tolerated injection well.  

## 2020-04-28 DIAGNOSIS — H401131 Primary open-angle glaucoma, bilateral, mild stage: Secondary | ICD-10-CM | POA: Diagnosis not present

## 2020-05-30 ENCOUNTER — Telehealth: Payer: Self-pay | Admitting: Family Medicine

## 2020-05-30 NOTE — Telephone Encounter (Signed)
Please let her know that the timing will be 8 months past the 2nd vaccine dose for a booster  (but no earlier than sept 20 for now as they are first concentrating on the immunocompromised population)  Once up and going- she should not need a note or order to get her booster  (should be able to just get it)

## 2020-05-30 NOTE — Telephone Encounter (Signed)
Do you know if the 8 month booster is recommended 8 months after the first or 2nd vaccine?    Also is it already formally recommended and if so how do I go about "ok ing" it ? Thanks so much

## 2020-05-30 NOTE — Telephone Encounter (Signed)
Patient informed and verbalized understanding

## 2020-05-30 NOTE — Telephone Encounter (Signed)
Pt called she will be traveling next week and wanted to know if  You would write a rx  Or give permission for pt to get a booster covid vaccine  1st vaccine 10/05/2019 2nd vaccine 10/27/2019 She got pfyser  [facility] [address]  Please advise  .

## 2020-05-30 NOTE — Telephone Encounter (Signed)
Dr. Glori Bickers, I have pulled this from the [facility] webpage regarding booster dosing and recommendations:  This was released from the CDC newsroom on 05/17/2020:  "We are prepared to offer booster shots for all Americans beginning the week of September 20 and starting 8 months after an individual's second dose. At that time, the individuals who were fully vaccinated earliest in the vaccination rollout, including many health care providers, nursing home residents, and other seniors, will likely be eligible for a booster. We would also begin efforts to deliver booster shots directly to residents of long-term care facilities at that time, given the distribution of vaccines to this population early in the vaccine rollout and the continued increased risk that COVID-19 poses to them."  It is 8 months from the 2nd dose and until further confirmed notification, they are still only vaccinating those who are deemed "immunocompromised" currently.  It is my understanding that you do not need an RX for the booster but the patient may be referring to some sort of note from the PCP stating that she is immunocompromised and can obtain booster vaccination during this initial phase.

## 2020-06-01 ENCOUNTER — Other Ambulatory Visit: Payer: Self-pay | Admitting: Family Medicine

## 2020-06-01 DIAGNOSIS — Z1231 Encounter for screening mammogram for malignant neoplasm of breast: Secondary | ICD-10-CM

## 2020-06-02 DIAGNOSIS — Z23 Encounter for immunization: Secondary | ICD-10-CM | POA: Diagnosis not present

## 2020-07-04 ENCOUNTER — Telehealth: Payer: Self-pay

## 2020-07-04 NOTE — Telephone Encounter (Signed)
Pt want to be tested for antibodies. She said she needs to know before getting the booster. Is this a test that Dr. Glori Bickers could order and she have here. She would like a call back.

## 2020-07-04 NOTE — Telephone Encounter (Signed)
I do not do the antibody tests.   I recommend getting the booster regardless

## 2020-07-05 NOTE — Telephone Encounter (Signed)
Left VM requesting pt to call the office back 

## 2020-07-05 NOTE — Telephone Encounter (Signed)
Pt notified and she will go ahead and schedule booster vaccine

## 2020-07-12 ENCOUNTER — Telehealth: Payer: Self-pay | Admitting: Family Medicine

## 2020-07-12 NOTE — Telephone Encounter (Signed)
I cannot re assure that a high or low titer indicates high or low immunity. (so I don't do the test)  I generally recommend the booster if she had pfizer 6 or months ago

## 2020-07-12 NOTE — Telephone Encounter (Signed)
Pt notified of Dr. Tower's comments and verbalized understanding  

## 2020-07-12 NOTE — Telephone Encounter (Signed)
Patient called in stating she is wanting to have a covid antibody order placed. Please advise if possible as she stated if she has high antibodies she does not see the need for booster.

## 2020-07-18 ENCOUNTER — Ambulatory Visit
Admission: RE | Admit: 2020-07-18 | Discharge: 2020-07-18 | Disposition: A | Discharge status: Home / Self Care | Payer: Medicare Other | Source: Ambulatory Visit | Attending: Family Medicine | Admitting: Family Medicine

## 2020-07-18 ENCOUNTER — Other Ambulatory Visit: Payer: Self-pay

## 2020-07-18 DIAGNOSIS — Z1231 Encounter for screening mammogram for malignant neoplasm of breast: Secondary | ICD-10-CM

## 2020-08-02 DIAGNOSIS — Z23 Encounter for immunization: Secondary | ICD-10-CM | POA: Diagnosis not present

## 2020-08-30 ENCOUNTER — Telehealth: Payer: Self-pay

## 2020-08-30 DIAGNOSIS — M810 Age-related osteoporosis without current pathological fracture: Secondary | ICD-10-CM

## 2020-08-30 NOTE — Telephone Encounter (Signed)
Last prolia inj was 03/15/20. Next due after 09/15/20 Need to run benefits.  No PA was needed for last benefits run. Not sure about this time.

## 2020-09-01 NOTE — Telephone Encounter (Signed)
Benefits submitted. Not sure if she has PPW have printed face sheet to check what out of pocked will be when I talk to rep today

## 2020-09-04 ENCOUNTER — Telehealth: Payer: Self-pay | Admitting: Family Medicine

## 2020-09-04 NOTE — Telephone Encounter (Signed)
Chart updated

## 2020-09-04 NOTE — Telephone Encounter (Signed)
Pt called to stated that dhe got her flu shot 06/02/20

## 2020-09-15 NOTE — Telephone Encounter (Signed)
Pt will owe $0  And no PA is needed. Pt needs scheduled for lab and NV.   LVM for pt to call clinic. Call must be transferred to [facility], RN to talk to the pt.

## 2020-09-18 NOTE — Telephone Encounter (Signed)
Contacted pt and she will have labs on 12/22 and inj on 09/27/20.  Placed BMP lab order.

## 2020-09-20 ENCOUNTER — Other Ambulatory Visit: Payer: Self-pay

## 2020-09-20 ENCOUNTER — Other Ambulatory Visit (INDEPENDENT_AMBULATORY_CARE_PROVIDER_SITE_OTHER): Payer: Medicare Other

## 2020-09-20 DIAGNOSIS — M810 Age-related osteoporosis without current pathological fracture: Secondary | ICD-10-CM

## 2020-09-20 LAB — BASIC METABOLIC PANEL
BUN: 20 mg/dL (ref 6–23)
CO2: 31 mEq/L (ref 19–32)
Calcium: 9.3 mg/dL (ref 8.4–10.5)
Chloride: 105 mEq/L (ref 96–112)
Creatinine, Ser: 0.7 mg/dL (ref 0.40–1.20)
GFR: 81.69 mL/min (ref >60.00)
Glucose, Bld: 95 mg/dL (ref 70–99)
Potassium: 4 mEq/L (ref 3.5–5.1)
Sodium: 141 mEq/L (ref 135–145)

## 2020-09-21 NOTE — Telephone Encounter (Addendum)
Ca normal and CrCl 47.76 and is ok for prolia inj on 12/29

## 2020-09-27 ENCOUNTER — Other Ambulatory Visit: Payer: Self-pay

## 2020-09-27 ENCOUNTER — Ambulatory Visit (INDEPENDENT_AMBULATORY_CARE_PROVIDER_SITE_OTHER): Payer: Medicare Other | Admitting: *Deleted

## 2020-09-27 DIAGNOSIS — M810 Age-related osteoporosis without current pathological fracture: Secondary | ICD-10-CM

## 2020-09-27 MED ORDER — DENOSUMAB 60 MG/ML ~~LOC~~ SOSY
60.0000 mg | PREFILLED_SYRINGE | Freq: Once | SUBCUTANEOUS | Status: AC
  Administered 2020-09-27: 60 mg via SUBCUTANEOUS

## 2020-09-27 NOTE — Progress Notes (Signed)
Per orders of Dr. Selena Batten, injection of Prolia given by Shon Millet.  Patient tolerated injection well.  PCP out of the office

## 2020-10-21 NOTE — Telephone Encounter (Signed)
Pt received prolia injection on 09/27/20.

## 2020-10-25 DIAGNOSIS — H401131 Primary open-angle glaucoma, bilateral, mild stage: Secondary | ICD-10-CM | POA: Diagnosis not present

## 2020-11-02 DIAGNOSIS — H401131 Primary open-angle glaucoma, bilateral, mild stage: Secondary | ICD-10-CM | POA: Diagnosis not present

## 2020-12-22 ENCOUNTER — Telehealth: Payer: Self-pay

## 2020-12-22 NOTE — Telephone Encounter (Signed)
There has been advertisements on TV about a PCV-20, ? What should we tell the pt's regarding this new pneumonia vaccine

## 2020-12-22 NOTE — Telephone Encounter (Signed)
Pt left v/m wanting to know if she needs another pneumonia shot. Per immunization list pt got pneumovax 23 on 05/09/2008 and prevacid 13 on 05/30/2004.

## 2020-12-23 NOTE — Telephone Encounter (Signed)
Per guidelines since she has already had the prevnar and pna 23 vaccine she does not recommend the pcv 20   I will keep a watch on the recommendations as they update and change

## 2020-12-25 NOTE — Telephone Encounter (Signed)
Pt notified of Dr. Tower's comments and verbalized understanding  

## 2021-02-14 ENCOUNTER — Telehealth: Payer: Self-pay

## 2021-02-14 DIAGNOSIS — M810 Age-related osteoporosis without current pathological fracture: Secondary | ICD-10-CM

## 2021-02-14 NOTE — Telephone Encounter (Signed)
Benefit verification submitted-pending Injection due after 03/29/21

## 2021-02-19 ENCOUNTER — Telehealth: Payer: Self-pay | Admitting: Family Medicine

## 2021-02-19 DIAGNOSIS — I8391 Asymptomatic varicose veins of right lower extremity: Secondary | ICD-10-CM

## 2021-02-19 NOTE — Telephone Encounter (Signed)
Patient is wanting a referral to a vascular surgeon for her veins. She has one in mind.  Patent did not want to come in to the office. Does she need an appointment or can you do the referral without her being see. Please advise. EM  Surgeon Leotis Pain / [facility]

## 2021-02-19 NOTE — Addendum Note (Signed)
Addended by: Loura Pardon A on: 02/19/2021 01:16 PM   Modules accepted: Orders

## 2021-02-19 NOTE — Telephone Encounter (Signed)
Referral done I have seen her for this before

## 2021-02-27 ENCOUNTER — Ambulatory Visit (INDEPENDENT_AMBULATORY_CARE_PROVIDER_SITE_OTHER): Payer: Medicare Other | Admitting: Vascular Surgery

## 2021-02-27 ENCOUNTER — Other Ambulatory Visit: Payer: Self-pay

## 2021-02-27 ENCOUNTER — Encounter (INDEPENDENT_AMBULATORY_CARE_PROVIDER_SITE_OTHER): Payer: Self-pay | Admitting: Vascular Surgery

## 2021-02-27 VITALS — BP 191/80 | HR 64 | Ht 67.0 in | Wt 101.0 lb

## 2021-02-27 DIAGNOSIS — E785 Hyperlipidemia, unspecified: Secondary | ICD-10-CM | POA: Insufficient documentation

## 2021-02-27 DIAGNOSIS — I8391 Asymptomatic varicose veins of right lower extremity: Secondary | ICD-10-CM | POA: Diagnosis not present

## 2021-02-27 NOTE — Progress Notes (Signed)
Patient ID: Kathy [name], female   DOB: 22-Sep-1941, 80 y.o.   MRN: [id number]  Chief Complaint  Patient presents with  . New Patient (Initial Visit)    New patient Tower Asymptomatic VV of RLE    HPI Kathy [name] is a 80 y.o. female.  I am asked to see the patient by Dr. Glori Bickers for evaluation of large varicosities of the right leg.  The patient presents with complaints of symptomatic varicosities of the right leg. The patient reports a long standing history of varicosities and they have become painful over time. There was no clear inciting event or causative factor that started the symptoms.  The right leg is more severly affected.  The left leg has only scattered spider varicosities.  The patient elevates the legs for relief. The pain is described as itching and stinging over the varicosities intermittently. The patient does not have much swelling as an associated symptom. The patient has no previous history of deep venous thrombosis or superficial thrombophlebitis to their knowledge.     Past Medical History:  Diagnosis Date  . Cancer ([place])    Hx of breast CA  . Glaucoma   . Hyperlipidemia   . Osteoporosis     Past Surgical History:  Procedure Laterality Date  . BREAST SURGERY  04/06/2001   mastectomy - left side  . DILATION AND CURETTAGE OF UTERUS    . EYE SURGERY  2010   cataract - left  . MASTECTOMY  03/2001   left  . TONSILLECTOMY      Family History  Problem Relation Age of Onset  . Heart disease Mother        CHF  . Hypertension Mother   . Stroke Father   . Hypertension Father   . Osteoporosis Father   . Heart disease Father        CHF  . Breast cancer Cousin   . Colon cancer Neg Hx      Social History   Tobacco Use  . Smoking status: Never Smoker  . Smokeless tobacco: Never Used  Vaping Use  . Vaping Use: Never used  Substance Use Topics  . Alcohol use: No    Alcohol/week: 0.0 standard drinks  . Drug use: No    No Known  Allergies  Current Outpatient Medications  Medication Sig Dispense Refill  . aspirin 81 MG tablet Take 81 mg by mouth daily.    . bimatoprost (LUMIGAN) 0.03 % ophthalmic solution Place 1 drop into both eyes at bedtime.    . Brinzolamide-Brimonidine (SIMBRINZA OP) Place 1 drop into both eyes 2 (two) times daily.    . Calcium Carbonate-Vitamin D (CALCIUM-VITAMIN D3) 600-125 MG-UNIT TABS Take by mouth.    . cholecalciferol (VITAMIN D) 1000 units tablet Take 5,000 Units by mouth daily.    . cyanocobalamin 1000 MCG tablet Take 100 mcg by mouth daily.    . dorzolamide-timolol (COSOPT) 22.3-6.8 MG/ML ophthalmic solution Place 22.3 mLs into both eyes 2 (two) times daily.    . fish oil-omega-3 fatty acids 1000 MG capsule Take 1 g by mouth daily.    . Multiple Vitamin (MULTIVITAMIN) capsule Take 1 capsule by mouth daily.    . Vitamin D, Ergocalciferol, (DRISDOL) 50000 units CAPS capsule Take 50,000 Units by mouth every 7 (seven) days.    . meclizine (ANTIVERT) 25 MG tablet Take 1 tablet (25 mg total) by mouth 3 (three) times daily as needed for dizziness (vertigo). (Patient not taking: No sig  reported) 20 tablet 0  . predniSONE (DELTASONE) 10 MG tablet Take 3 pills once daily by mouth for 3 days, then 2 pills once daily for 3 days, then 1 pill once daily for 3 days and then stop (Patient not taking: Reported on 02/27/2021) 18 tablet 0   No current facility-administered medications for this visit.      REVIEW OF SYSTEMS (Negative unless checked)  Constitutional: [] Weight loss  [] Fever  [] Chills Cardiac: [] Chest pain   [] Chest pressure   [] Palpitations   [] Shortness of breath when laying flat   [] Shortness of breath at rest   [] Shortness of breath with exertion. Vascular:  [] Pain in legs with walking   [] Pain in legs at rest   [] Pain in legs when laying flat   [] Claudication   [] Pain in feet when walking  [] Pain in feet at rest  [] Pain in feet when laying flat   [] History of DVT   [] Phlebitis    [] Swelling in legs   [x] Varicose veins   [] Non-healing ulcers Pulmonary:   [] Uses home oxygen   [] Productive cough   [] Hemoptysis   [] Wheeze  [] COPD   [] Asthma Neurologic:  [] Dizziness  [] Blackouts   [] Seizures   [] History of stroke   [] History of TIA  [] Aphasia   [] Temporary blindness   [] Dysphagia   [] Weakness or numbness in arms   [] Weakness or numbness in legs Musculoskeletal:  [x] Arthritis   [] Joint swelling   [] Joint pain   [] Low back pain Hematologic:  [] Easy bruising  [] Easy bleeding   [] Hypercoagulable state   [] Anemic  [] Hepatitis Gastrointestinal:  [] Blood in stool   [] Vomiting blood  [] Gastroesophageal reflux/heartburn   [] Abdominal pain Genitourinary:  [] Chronic kidney disease   [] Difficult urination  [] Frequent urination  [] Burning with urination   [] Hematuria Skin:  [] Rashes   [] Ulcers   [] Wounds Psychological:  [] History of anxiety   []  History of major depression.    Physical Exam BP (!) 191/80   Pulse 64   Ht 5\' 7"  (1.702 m)   Wt 101 lb (45.8 kg)   BMI 15.82 kg/m  Gen:  WD/WN, NAD. Appears younger than stated age Head: [place]/AT, No temporalis wasting.  Ear/Nose/Throat: Hearing grossly intact, dentition good Eyes: Sclera non-icteric. Conjunctiva clear Neck: Supple. Trachea midline Pulmonary:  Good air movement, no use of accessory muscles, respirations not labored.  Cardiac: RRR, No JVD Vascular: Varicosities extensive and measuring up to 3-4 mm in the right lower extremity        Varicosities scattered and measuring up to 1-2 mm in the left lower extremity Vessel Right Left  Radial Palpable Palpable                          PT Palpable Palpable  DP Palpable Palpable   Gastrointestinal: soft, non-tender/non-distended.  Musculoskeletal: M/S 5/5 throughout.  No LE edema Neurologic: Sensation grossly intact in extremities.  Symmetrical.  Speech is fluent.  Psychiatric: Judgment intact, Mood & affect appropriate for pt's clinical situation. Dermatologic: No rashes  or ulcers noted.  No cellulitis or open wounds.    Radiology No results found.  Labs No results found for this or any previous visit (from the past 2160 hour(s)).  Assessment/Plan:  Hyperlipidemia lipid control important in reducing the progression of atherosclerotic disease. Continue statin therapy   Varicose vein of leg   The patient has symptoms consistent with chronic venous insufficiency. We discussed the natural history and treatment options for venous disease. I recommended  the regular use of 20 - 30 mm Hg compression stockings, and prescribed these today. I recommended leg elevation and anti-inflammatories as needed for pain. I have also recommended a complete venous duplex to assess the venous system for reflux or thrombotic issues. This can be done at the patient's convenience. I will see the patient back after the duplex to assess the response to conservative management, and determine further treatment options.     Leotis Pain 02/27/2021, 5:30 PM   This note was created with Dragon medical transcription system.  Any errors from dictation are unintentional.

## 2021-02-27 NOTE — Assessment & Plan Note (Signed)
lipid control important in reducing the progression of atherosclerotic disease. Continue statin therapy  

## 2021-03-09 ENCOUNTER — Telehealth: Payer: Self-pay

## 2021-03-09 NOTE — Telephone Encounter (Signed)
error 

## 2021-03-16 NOTE — Addendum Note (Signed)
Addended by: Kris Mouton on: 03/16/2021 10:05 AM   Modules accepted: Orders

## 2021-03-16 NOTE — Telephone Encounter (Signed)
Benefits received. Patient may owe $15-16 but otherwise $0. Patient advised. Lab scheduled on 03/26/21 and Nurse visit 04/03/21.

## 2021-03-18 ENCOUNTER — Other Ambulatory Visit (INDEPENDENT_AMBULATORY_CARE_PROVIDER_SITE_OTHER): Payer: Self-pay | Admitting: Vascular Surgery

## 2021-03-18 DIAGNOSIS — I83891 Varicose veins of right lower extremities with other complications: Secondary | ICD-10-CM

## 2021-03-18 DIAGNOSIS — I83899 Varicose veins of unspecified lower extremities with other complications: Secondary | ICD-10-CM

## 2021-03-23 ENCOUNTER — Ambulatory Visit (INDEPENDENT_AMBULATORY_CARE_PROVIDER_SITE_OTHER): Payer: Medicare Other

## 2021-03-23 ENCOUNTER — Other Ambulatory Visit: Payer: Self-pay

## 2021-03-23 ENCOUNTER — Ambulatory Visit (INDEPENDENT_AMBULATORY_CARE_PROVIDER_SITE_OTHER): Payer: Medicare Other | Admitting: Vascular Surgery

## 2021-03-23 ENCOUNTER — Encounter (INDEPENDENT_AMBULATORY_CARE_PROVIDER_SITE_OTHER): Payer: Self-pay | Admitting: Vascular Surgery

## 2021-03-23 VITALS — BP 149/78 | HR 57 | Resp 16 | Wt 103.0 lb

## 2021-03-23 DIAGNOSIS — I83891 Varicose veins of right lower extremities with other complications: Secondary | ICD-10-CM

## 2021-03-23 DIAGNOSIS — E785 Hyperlipidemia, unspecified: Secondary | ICD-10-CM | POA: Diagnosis not present

## 2021-03-23 DIAGNOSIS — I83899 Varicose veins of unspecified lower extremities with other complications: Secondary | ICD-10-CM | POA: Diagnosis not present

## 2021-03-23 DIAGNOSIS — I83811 Varicose veins of right lower extremities with pain: Secondary | ICD-10-CM

## 2021-03-23 NOTE — Assessment & Plan Note (Signed)
Her venous reflux study today shows long segment reflux in the right great saphenous vein including the saphenofemoral junction.  No DVT or superficial thrombophlebitis is identified.  We discussed the pathophysiology and natural history of venous reflux and the reason and rationale for treatment.  We discussed laser ablation is being the primary treatment modality used for reflux of the right great saphenous vein.  I discussed the risks and benefits the procedure.  She wants to go home and discuss this with her husband which is certainly a reasonable thing to do and she will call our office if she decides to proceed with right great saphenous vein laser ablation.

## 2021-03-23 NOTE — Progress Notes (Signed)
MRN : [id number]  Kathy [name] is a 80 y.o. ([birth year]) female who presents with chief complaint of  Chief Complaint  Patient presents with   Follow-up    Ultrasound follow up  .  History of Present Illness: Patient returns today in follow up of her venous disease.  She has no new symptoms or problems since her last visit which was only a few weeks ago.  Wearing compression stockings and elevating her legs.  No fevers or chills.  No chest pain or shortness of breath.  Her venous reflux study today shows long segment reflux in the right great saphenous vein including the saphenofemoral junction.  No DVT or superficial thrombophlebitis is identified.  Current Outpatient Medications  Medication Sig Dispense Refill   aspirin 81 MG tablet Take 81 mg by mouth daily.     bimatoprost (LUMIGAN) 0.03 % ophthalmic solution Place 1 drop into both eyes at bedtime.     Brinzolamide-Brimonidine (SIMBRINZA OP) Place 1 drop into both eyes 2 (two) times daily.     Calcium Carbonate-Vitamin D (CALCIUM-VITAMIN D3) 600-125 MG-UNIT TABS Take by mouth.     cholecalciferol (VITAMIN D) 1000 units tablet Take 5,000 Units by mouth daily.     cyanocobalamin 1000 MCG tablet Take 100 mcg by mouth daily.     dorzolamide-timolol (COSOPT) 22.3-6.8 MG/ML ophthalmic solution Place 22.3 mLs into both eyes 2 (two) times daily.     fish oil-omega-3 fatty acids 1000 MG capsule Take 1 g by mouth daily.     Multiple Vitamin (MULTIVITAMIN) capsule Take 1 capsule by mouth daily.     Vitamin D, Ergocalciferol, (DRISDOL) 50000 units CAPS capsule Take 50,000 Units by mouth every 7 (seven) days.     meclizine (ANTIVERT) 25 MG tablet Take 1 tablet (25 mg total) by mouth 3 (three) times daily as needed for dizziness (vertigo). (Patient not taking: No sig reported) 20 tablet 0   predniSONE (DELTASONE) 10 MG tablet Take 3 pills once daily by mouth for 3 days, then 2 pills once daily for 3 days, then 1 pill once daily for 3 days and  then stop (Patient not taking: No sig reported) 18 tablet 0   No current facility-administered medications for this visit.    Past Medical History:  Diagnosis Date   Cancer ([place])    Hx of breast CA   Glaucoma    Hyperlipidemia    Osteoporosis     Past Surgical History:  Procedure Laterality Date   BREAST SURGERY  04/06/2001   mastectomy - left side   DILATION AND CURETTAGE OF UTERUS     EYE SURGERY  2010   cataract - left   MASTECTOMY  03/2001   left   TONSILLECTOMY       Social History   Tobacco Use   Smoking status: Never   Smokeless tobacco: Never  Vaping Use   Vaping Use: Never used  Substance Use Topics   Alcohol use: No    Alcohol/week: 0.0 standard drinks   Drug use: No      Family History  Problem Relation Age of Onset   Heart disease Mother        CHF   Hypertension Mother    Stroke Father    Hypertension Father    Osteoporosis Father    Heart disease Father        CHF   Breast cancer Cousin    Colon cancer Neg Hx      No Known  Allergies   REVIEW OF SYSTEMS (Negative unless checked)  Constitutional: [] Weight loss  [] Fever  [] Chills Cardiac: [] Chest pain   [] Chest pressure   [] Palpitations   [] Shortness of breath when laying flat   [] Shortness of breath at rest   [] Shortness of breath with exertion. Vascular:  [] Pain in legs with walking   [] Pain in legs at rest   [] Pain in legs when laying flat   [] Claudication   [] Pain in feet when walking  [] Pain in feet at rest  [] Pain in feet when laying flat   [] History of DVT   [] Phlebitis   [] Swelling in legs   [x] Varicose veins   [] Non-healing ulcers Pulmonary:   [] Uses home oxygen   [] Productive cough   [] Hemoptysis   [] Wheeze  [] COPD   [] Asthma Neurologic:  [] Dizziness  [] Blackouts   [] Seizures   [] History of stroke   [] History of TIA  [] Aphasia   [] Temporary blindness   [] Dysphagia   [] Weakness or numbness in arms   [] Weakness or numbness in legs Musculoskeletal:  [x] Arthritis   [] Joint swelling    [] Joint pain   [] Low back pain Hematologic:  [] Easy bruising  [] Easy bleeding   [] Hypercoagulable state   [] Anemic   Gastrointestinal:  [] Blood in stool   [] Vomiting blood  [] Gastroesophageal reflux/heartburn   [] Abdominal pain Genitourinary:  [] Chronic kidney disease   [] Difficult urination  [] Frequent urination  [] Burning with urination   [] Hematuria Skin:  [] Rashes   [] Ulcers   [] Wounds Psychological:  [] History of anxiety   []  History of major depression.  Physical Examination  BP (!) 149/78 (BP Location: Right Arm)   Pulse (!) 57   Resp 16   Wt 103 lb (46.7 kg)   BMI 16.13 kg/m  Gen:  WD/WN, NAD.  Appears younger than stated age Head: [place]/AT, No temporalis wasting. Ear/Nose/Throat: Hearing grossly intact, nares w/o erythema or drainage Eyes: Conjunctiva clear. Sclera non-icteric Neck: Supple.  Trachea midline Pulmonary:  Good air movement, no use of accessory muscles.  Cardiac: RRR, no JVD Vascular: Vessel Right Left  Radial Palpable Palpable                          PT Palpable Palpable  DP Palpable Palpable    Musculoskeletal: M/S 5/5 throughout.  No deformity or atrophy.  Extensive varicosities are present in the right lower extremity measuring 3 to 4 mm.  Trace right lower extremity edema.  Scattered left leg varicosities with no edema. Neurologic: Sensation grossly intact in extremities.  Symmetrical.  Speech is fluent.  Psychiatric: Judgment intact, Mood & affect appropriate for pt's clinical situation. Dermatologic: No rashes or ulcers noted.  No cellulitis or open wounds.      Labs No results found for this or any previous visit (from the past 2160 hour(s)).  Radiology VAS [place] LOWER EXTREMITY VENOUS REFLUX  Result Date: 03/23/2021  Lower Venous Reflux Study Patient Name:  Kathy [name]  Date of Exam:   03/23/2021 Medical Rec #: [id number]           Accession #:    [id number] Date of Birth: 1941/09/10           Patient Gender: F Patient Age:   69Y Exam  Location:  [facility] Procedure:      VAS [place] LOWER EXTREMITY VENOUS REFLUX Referring Phys: [id number] Horice Carrero S Cottrell Gentles --------------------------------------------------------------------------------  Indications: Varicosities, Pain, and right.  Performing Technologist: Concha Norway RVT  Examination Guidelines: A  complete evaluation includes B-mode imaging, spectral Doppler, color Doppler, and power Doppler as needed of all accessible portions of each vessel. Bilateral testing is considered an integral part of a complete examination. Limited examinations for reoccurring indications may be performed as noted. The reflux portion of the exam is performed with the patient in reverse Trendelenburg. Significant venous reflux is defined as >500 ms in the superficial venous system, and >1 second in the deep venous system.  Venous Reflux Times +--------------+---------+------+-----------+------------+---------+ RIGHT         Reflux NoRefluxReflux TimeDiameter cmsComments                          Yes                                   +--------------+---------+------+-----------+------------+---------+ CFV                                                 pulsatile +--------------+---------+------+-----------+------------+---------+ GSV at SFJ              yes    >500 ms      .61     pulsatile +--------------+---------+------+-----------+------------+---------+ GSV prox thigh          yes    >500 ms      .32               +--------------+---------+------+-----------+------------+---------+ GSV mid thigh           yes    >500 ms      .39               +--------------+---------+------+-----------+------------+---------+ GSV dist thigh          yes    >500 ms      .34               +--------------+---------+------+-----------+------------+---------+ GSV at knee             yes    >500 ms      .39                +--------------+---------+------+-----------+------------+---------+ GSV prox calf           yes    >500 ms      .40               +--------------+---------+------+-----------+------------+---------+ SSV prox calf no                            .21               +--------------+---------+------+-----------+------------+---------+   Summary: Right: - No evidence of deep vein thrombosis seen in the right lower extremity, from the common femoral through the popliteal veins. - No evidence of superficial venous thrombosis in the right lower extremity. - Venous reflux is noted in the right sapheno-femoral junction. - Venous reflux is noted in the right greater saphenous vein in the thigh. - Venous reflux is noted in the right greater saphenous vein in the calf.  *See table(s) above for measurements and observations. Electronically signed by Leotis Pain MD on 03/23/2021 at 11:44:45 AM.    Final     Assessment/Plan Hyperlipidemia lipid control important in reducing the progression  of atherosclerotic disease. Continue statin therapy  Varicose veins of leg with pain, right Her venous reflux study today shows long segment reflux in the right great saphenous vein including the saphenofemoral junction.  No DVT or superficial thrombophlebitis is identified.  We discussed the pathophysiology and natural history of venous reflux and the reason and rationale for treatment.  We discussed laser ablation is being the primary treatment modality used for reflux of the right great saphenous vein.  I discussed the risks and benefits the procedure.  She wants to go home and discuss this with her husband which is certainly a reasonable thing to do and she will call our office if she decides to proceed with right great saphenous vein laser ablation.    Leotis Pain, MD  03/23/2021 12:21 PM    This note was created with Dragon medical transcription system.  Any errors from dictation are purely unintentional

## 2021-03-26 ENCOUNTER — Other Ambulatory Visit: Payer: Self-pay

## 2021-03-26 ENCOUNTER — Other Ambulatory Visit (INDEPENDENT_AMBULATORY_CARE_PROVIDER_SITE_OTHER): Payer: Medicare Other

## 2021-03-26 DIAGNOSIS — M810 Age-related osteoporosis without current pathological fracture: Secondary | ICD-10-CM | POA: Diagnosis not present

## 2021-03-26 LAB — BASIC METABOLIC PANEL
BUN: 19 mg/dL (ref 6–23)
CO2: 27 mEq/L (ref 19–32)
Calcium: 9.1 mg/dL (ref 8.4–10.5)
Chloride: 106 mEq/L (ref 96–112)
Creatinine, Ser: 0.61 mg/dL (ref 0.40–1.20)
GFR: 84.15 mL/min (ref >60.00)
Glucose, Bld: 60 mg/dL — ABNORMAL LOW (ref 70–99)
Potassium: 3.7 mEq/L (ref 3.5–5.1)
Sodium: 140 mEq/L (ref 135–145)

## 2021-03-27 NOTE — Telephone Encounter (Signed)
Creatinine Clearance Calculation is 54.23 mL/min. Calcium was 9.1-6/27/22.  Still ok to proceed with Prolia?

## 2021-03-27 NOTE — Telephone Encounter (Signed)
noted 

## 2021-03-27 NOTE — Telephone Encounter (Signed)
Proceed with prolia Thanks

## 2021-04-03 ENCOUNTER — Ambulatory Visit (INDEPENDENT_AMBULATORY_CARE_PROVIDER_SITE_OTHER): Payer: Medicare Other

## 2021-04-03 ENCOUNTER — Other Ambulatory Visit: Payer: Self-pay

## 2021-04-03 DIAGNOSIS — M810 Age-related osteoporosis without current pathological fracture: Secondary | ICD-10-CM

## 2021-04-03 MED ORDER — DENOSUMAB 60 MG/ML ~~LOC~~ SOSY
60.0000 mg | PREFILLED_SYRINGE | Freq: Once | SUBCUTANEOUS | Status: AC
  Administered 2021-04-03: 60 mg via SUBCUTANEOUS

## 2021-04-03 NOTE — Progress Notes (Signed)
Patient presented for 6-month Prolia injection SQ to right arm given by Joydan Gretzinger, CMA. Patient tolerated injection well.  

## 2021-04-05 ENCOUNTER — Telehealth: Payer: Self-pay

## 2021-04-05 DIAGNOSIS — Z20822 Contact with and (suspected) exposure to covid-19: Secondary | ICD-10-CM | POA: Diagnosis not present

## 2021-04-05 NOTE — Telephone Encounter (Signed)
Patient is calling in stating that she received a missed a call from someone and thought it was the office, but when they called again she answered it. The person on the other line stated that they are calling with Dr.Tower and that she prescribed medication for her back and knee pain when she declined it saying she isnt having any issues they responded with well I need to mail you this prescription.They were very persistent with wanting to send medication to her house. Kathy [name] then hung up on them, but is a little freaked out and wanted our office to be aware of this situation.

## 2021-04-05 NOTE — Telephone Encounter (Signed)
That is very strange - unaware of anything related to that.

## 2021-04-09 NOTE — Telephone Encounter (Signed)
Pt reports she is not sure if they said a prescription or were talking about back or knee braces. She said she doesn't trust anyone that calls anymore though. Advised pt if she ever had a question if it is our clinic, to hang up and then dial the clinic and ask to speak to a nurse. Pt was appreciative for the call and verbalized understanding.

## 2021-04-09 NOTE — Telephone Encounter (Signed)
LVM

## 2021-04-17 ENCOUNTER — Telehealth: Payer: Self-pay | Admitting: Family Medicine

## 2021-04-17 DIAGNOSIS — M810 Age-related osteoporosis without current pathological fracture: Secondary | ICD-10-CM

## 2021-04-17 DIAGNOSIS — E785 Hyperlipidemia, unspecified: Secondary | ICD-10-CM

## 2021-04-17 DIAGNOSIS — D696 Thrombocytopenia, unspecified: Secondary | ICD-10-CM

## 2021-04-17 DIAGNOSIS — Z0000 Encounter for general adult medical examination without abnormal findings: Secondary | ICD-10-CM

## 2021-04-17 DIAGNOSIS — E559 Vitamin D deficiency, unspecified: Secondary | ICD-10-CM

## 2021-04-17 NOTE — Telephone Encounter (Signed)
-----   Message from Cloyd Stagers, RT sent at 04/05/2021  4:57 PM EDT ----- Regarding: Lab Orders for Wednesday 7.20.2022 Please place lab orders for Wednesday 7.20.2022, office visit for physical on Thursday 7.28.2022 Thank you, Dyke Maes RT(R)

## 2021-04-18 ENCOUNTER — Ambulatory Visit (INDEPENDENT_AMBULATORY_CARE_PROVIDER_SITE_OTHER): Payer: Medicare Other

## 2021-04-18 ENCOUNTER — Other Ambulatory Visit (INDEPENDENT_AMBULATORY_CARE_PROVIDER_SITE_OTHER): Payer: Medicare Other

## 2021-04-18 ENCOUNTER — Other Ambulatory Visit: Payer: Self-pay

## 2021-04-18 DIAGNOSIS — E785 Hyperlipidemia, unspecified: Secondary | ICD-10-CM | POA: Diagnosis not present

## 2021-04-18 DIAGNOSIS — Z0000 Encounter for general adult medical examination without abnormal findings: Secondary | ICD-10-CM

## 2021-04-18 DIAGNOSIS — D696 Thrombocytopenia, unspecified: Secondary | ICD-10-CM

## 2021-04-18 DIAGNOSIS — E559 Vitamin D deficiency, unspecified: Secondary | ICD-10-CM

## 2021-04-18 LAB — LIPID PANEL
Cholesterol: 204 mg/dL — ABNORMAL HIGH (ref 0–200)
HDL: 89.3 mg/dL (ref >39.00)
LDL Cholesterol: 98 mg/dL (ref 0–99)
NonHDL: 114.61
Total CHOL/HDL Ratio: 2
Triglycerides: 83 mg/dL (ref 0.0–149.0)
VLDL: 16.6 mg/dL (ref 0.0–40.0)

## 2021-04-18 LAB — COMPREHENSIVE METABOLIC PANEL
ALT: 12 U/L (ref 0–35)
AST: 17 U/L (ref 0–37)
Albumin: 4 g/dL (ref 3.5–5.2)
Alkaline Phosphatase: 40 U/L (ref 39–117)
BUN: 20 mg/dL (ref 6–23)
CO2: 27 mEq/L (ref 19–32)
Calcium: 9 mg/dL (ref 8.4–10.5)
Chloride: 108 mEq/L (ref 96–112)
Creatinine, Ser: 0.62 mg/dL (ref 0.40–1.20)
GFR: 83.78 mL/min (ref >60.00)
Glucose, Bld: 80 mg/dL (ref 70–99)
Potassium: 3.8 mEq/L (ref 3.5–5.1)
Sodium: 143 mEq/L (ref 135–145)
Total Bilirubin: 0.5 mg/dL (ref 0.2–1.2)
Total Protein: 5.9 g/dL — ABNORMAL LOW (ref 6.0–8.3)

## 2021-04-18 LAB — TSH: TSH: 2.14 u[IU]/mL (ref 0.35–5.50)

## 2021-04-18 LAB — CBC WITH DIFFERENTIAL/PLATELET
Basophils Absolute: 0 10*3/uL (ref 0.0–0.1)
Basophils Relative: 0.9 % (ref 0.0–3.0)
Eosinophils Absolute: 0.3 10*3/uL (ref 0.0–0.7)
Eosinophils Relative: 5.6 % — ABNORMAL HIGH (ref 0.0–5.0)
HCT: 38.5 % (ref 36.0–46.0)
Hemoglobin: 12.9 g/dL (ref 12.0–15.0)
Lymphocytes Relative: 29.3 % (ref 12.0–46.0)
Lymphs Abs: 1.3 10*3/uL (ref 0.7–4.0)
MCHC: 33.6 g/dL (ref 30.0–36.0)
MCV: 94.4 fl (ref 78.0–100.0)
Monocytes Absolute: 0.4 10*3/uL (ref 0.1–1.0)
Monocytes Relative: 8 % (ref 3.0–12.0)
Neutro Abs: 2.6 10*3/uL (ref 1.4–7.7)
Neutrophils Relative %: 56.2 % (ref 43.0–77.0)
Platelets: 142 10*3/uL — ABNORMAL LOW (ref 150.0–400.0)
RBC: 4.08 Mil/uL (ref 3.87–5.11)
RDW: 13.8 % (ref 11.5–15.5)
WBC: 4.6 10*3/uL (ref 4.0–10.5)

## 2021-04-18 LAB — VITAMIN D 25 HYDROXY (VIT D DEFICIENCY, FRACTURES): VITD: 62.64 ng/mL (ref 30.00–100.00)

## 2021-04-18 NOTE — Patient Instructions (Signed)
Kathy [name] , Thank you for taking time to come for your Medicare Wellness Visit. I appreciate your ongoing commitment to your health goals. Please review the following plan we discussed and let me know if I can assist you in the future.   Screening recommendations/referrals: Colonoscopy: no longer required  Mammogram: Up to date, completed 07/18/2020, due 06/2021 Bone Density: due, will discuss with provider at physical  Recommended yearly ophthalmology/optometry visit for glaucoma screening and checkup Recommended yearly dental visit for hygiene and checkup  Vaccinations: Influenza vaccine: Up to date, completed 06/02/2020, due 04/2021 Pneumococcal vaccine: Completed series Tdap vaccine: Up to date, completed 06/03/2013, due 06/2023 Shingles vaccine: Completed series   Covid-19:completed 2 vaccines  Advanced directives: copy in chart  Conditions/risks identified: hyperlipidemia  Next appointment: Follow up in one year for your annual wellness visit    Preventive Care 65 Years and Older, Female Preventive care refers to lifestyle choices and visits with your health care provider that can promote health and wellness. What does preventive care include? A yearly physical exam. This is also called an annual well check. Dental exams once or twice a year. Routine eye exams. Ask your health care provider how often you should have your eyes checked. Personal lifestyle choices, including: Daily care of your teeth and gums. Regular physical activity. Eating a healthy diet. Avoiding tobacco and drug use. Limiting alcohol use. Practicing safe sex. Taking low-dose aspirin every day. Taking vitamin and mineral supplements as recommended by your health care provider. What happens during an annual well check? The services and screenings done by your health care provider during your annual well check will depend on your age, overall health, lifestyle risk factors, and family history of  disease. Counseling  Your health care provider may ask you questions about your: Alcohol use. Tobacco use. Drug use. Emotional well-being. Home and relationship well-being. Sexual activity. Eating habits. History of falls. Memory and ability to understand (cognition). Work and work Statistician. Reproductive health. Screening  You may have the following tests or measurements: Height, weight, and BMI. Blood pressure. Lipid and cholesterol levels. These may be checked every 5 years, or more frequently if you are over 2 years old. Skin check. Lung cancer screening. You may have this screening every year starting at age 46 if you have a 30-pack-year history of smoking and currently smoke or have quit within the past 15 years. Fecal occult blood test (FOBT) of the stool. You may have this test every year starting at age 30. Flexible sigmoidoscopy or colonoscopy. You may have a sigmoidoscopy every 5 years or a colonoscopy every 10 years starting at age 22. Hepatitis C blood test. Hepatitis B blood test. Sexually transmitted disease (STD) testing. Diabetes screening. This is done by checking your blood sugar (glucose) after you have not eaten for a while (fasting). You may have this done every 1-3 years. Bone density scan. This is done to screen for osteoporosis. You may have this done starting at age 73. Mammogram. This may be done every 1-2 years. Talk to your health care provider about how often you should have regular mammograms. Talk with your health care provider about your test results, treatment options, and if necessary, the need for more tests. Vaccines  Your health care provider may recommend certain vaccines, such as: Influenza vaccine. This is recommended every year. Tetanus, diphtheria, and acellular pertussis (Tdap, Td) vaccine. You may need a Td booster every 10 years. Zoster vaccine. You may need this after age 69. Pneumococcal  13-valent conjugate (PCV13) vaccine. One  dose is recommended after age 76. Pneumococcal polysaccharide (PPSV23) vaccine. One dose is recommended after age 58. Talk to your health care provider about which screenings and vaccines you need and how often you need them. This information is not intended to replace advice given to you by your health care provider. Make sure you discuss any questions you have with your health care provider. Document Released: 10/13/2015 Document Revised: 06/05/2016 Document Reviewed: 07/18/2015 Elsevier Interactive Patient Education  2017 [employer] [facility] [place] Prevention in the Home Falls can cause injuries. They can happen to people of all ages. There are many things you can do to make your home safe and to help prevent falls. What can I do on the outside of my home? Regularly fix the edges of walkways and driveways and fix any cracks. Remove anything that might make you trip as you walk through a door, such as a raised step or threshold. Trim any bushes or trees on the path to your home. Use bright outdoor lighting. Clear any walking paths of anything that might make someone trip, such as rocks or tools. Regularly check to see if handrails are loose or broken. Make sure that both sides of any steps have handrails. Any raised decks and porches should have guardrails on the edges. Have any leaves, snow, or ice cleared regularly. Use sand or salt on walking paths during winter. Clean up any spills in your garage right away. This includes oil or grease spills. What can I do in the bathroom? Use night lights. Install grab bars by the toilet and in the tub and shower. Do not use towel bars as grab bars. Use non-skid mats or decals in the tub or shower. If you need to sit down in the shower, use a plastic, non-slip stool. Keep the floor dry. Clean up any water that spills on the floor as soon as it happens. Remove soap buildup in the tub or shower regularly. Attach bath mats securely with double-sided  non-slip rug tape. Do not have throw rugs and other things on the floor that can make you trip. What can I do in the bedroom? Use night lights. Make sure that you have a light by your bed that is easy to reach. Do not use any sheets or blankets that are too big for your bed. They should not hang down onto the floor. Have a firm chair that has side arms. You can use this for support while you get dressed. Do not have throw rugs and other things on the floor that can make you trip. What can I do in the kitchen? Clean up any spills right away. Avoid walking on wet floors. Keep items that you use a lot in easy-to-reach places. If you need to reach something above you, use a strong step stool that has a grab bar. Keep electrical cords out of the way. Do not use floor polish or wax that makes floors slippery. If you must use wax, use non-skid floor wax. Do not have throw rugs and other things on the floor that can make you trip. What can I do with my stairs? Do not leave any items on the stairs. Make sure that there are handrails on both sides of the stairs and use them. Fix handrails that are broken or loose. Make sure that handrails are as long as the stairways. Check any carpeting to make sure that it is firmly attached to the stairs. Fix any carpet  that is loose or worn. Avoid having throw rugs at the top or bottom of the stairs. If you do have throw rugs, attach them to the floor with carpet tape. Make sure that you have a light switch at the top of the stairs and the bottom of the stairs. If you do not have them, ask someone to add them for you. What else can I do to help prevent falls? Wear shoes that: Do not have high heels. Have rubber bottoms. Are comfortable and fit you well. Are closed at the toe. Do not wear sandals. If you use a stepladder: Make sure that it is fully opened. Do not climb a closed stepladder. Make sure that both sides of the stepladder are locked into place. Ask  someone to hold it for you, if possible. Clearly mark and make sure that you can see: Any grab bars or handrails. First and last steps. Where the edge of each step is. Use tools that help you move around (mobility aids) if they are needed. These include: Canes. Walkers. Scooters. Crutches. Turn on the lights when you go into a dark area. Replace any light bulbs as soon as they burn out. Set up your furniture so you have a clear path. Avoid moving your furniture around. If any of your floors are uneven, fix them. If there are any pets around you, be aware of where they are. Review your medicines with your doctor. Some medicines can make you feel dizzy. This can increase your chance of falling. Ask your doctor what other things that you can do to help prevent falls. This information is not intended to replace advice given to you by your health care provider. Make sure you discuss any questions you have with your health care provider. Document Released: 07/13/2009 Document Revised: 02/22/2016 Document Reviewed: 10/21/2014 Elsevier Interactive Patient Education  2017 [employer].

## 2021-04-18 NOTE — Progress Notes (Signed)
Subjective:   Kathy [name] is a 80 y.o. female who presents for Medicare Annual (Subsequent) preventive examination.  Review of Systems: N/A      I connected with the patient today by telephone and verified that I am speaking with the correct person using two identifiers. Location patient: home Location nurse: work Persons participating in the telephone visit: patient, nurse.   I discussed the limitations, risks, security and privacy concerns of performing an evaluation and management service by telephone and the availability of in person appointments. I also discussed with the patient that there may be a patient responsible charge related to this service. The patient expressed understanding and verbally consented to this telephonic visit.        Cardiac Risk Factors include: advanced age (>76men, >43 women);Other (see comment), Risk factor comments: hyperlipidemia     Objective:    Today's Vitals   There is no height or weight on file to calculate BMI.  Advanced Directives 04/18/2021 07/05/2019 06/26/2018 06/18/2017  Does Patient Have a Medical Advance Directive? Yes Yes Yes Yes  Type of Paramedic of [place];Living will [place];Living will Living will;Healthcare Power of [place];Living will  Does patient want to make changes to medical advance directive? - - No - Patient declined -  Copy of [place] in Chart? Yes - validated most recent copy scanned in chart (See row information) Yes - validated most recent copy scanned in chart (See row information) No - copy requested No - copy requested    Current Medications (verified) Outpatient Encounter Medications as of 04/18/2021  Medication Sig   aspirin 81 MG tablet Take 81 mg by mouth daily.   bimatoprost (LUMIGAN) 0.03 % ophthalmic solution Place 1 drop into both eyes at bedtime.   Brinzolamide-Brimonidine (SIMBRINZA OP) Place 1 drop  into both eyes 2 (two) times daily.   Calcium Carbonate-Vitamin D (CALCIUM-VITAMIN D3) 600-125 MG-UNIT TABS Take by mouth.   cholecalciferol (VITAMIN D) 1000 units tablet Take 5,000 Units by mouth daily.   cyanocobalamin 1000 MCG tablet Take 100 mcg by mouth daily.   dorzolamide-timolol (COSOPT) 22.3-6.8 MG/ML ophthalmic solution Place 22.3 mLs into both eyes 2 (two) times daily.   fish oil-omega-3 fatty acids 1000 MG capsule Take 1 g by mouth daily.   Multiple Vitamin (MULTIVITAMIN) capsule Take 1 capsule by mouth daily.   Vitamin D, Ergocalciferol, (DRISDOL) 50000 units CAPS capsule Take 50,000 Units by mouth every 7 (seven) days.   meclizine (ANTIVERT) 25 MG tablet Take 1 tablet (25 mg total) by mouth 3 (three) times daily as needed for dizziness (vertigo). (Patient not taking: No sig reported)   predniSONE (DELTASONE) 10 MG tablet Take 3 pills once daily by mouth for 3 days, then 2 pills once daily for 3 days, then 1 pill once daily for 3 days and then stop (Patient not taking: No sig reported)   No facility-administered encounter medications on file as of 04/18/2021.    Allergies (verified) Patient has no known allergies.   History: Past Medical History:  Diagnosis Date   Cancer ([place])    Hx of breast CA   Glaucoma    Hyperlipidemia    Osteoporosis    Past Surgical History:  Procedure Laterality Date   BREAST SURGERY  04/06/2001   mastectomy - left side   DILATION AND CURETTAGE OF UTERUS     EYE SURGERY  2010   cataract - left   MASTECTOMY  03/2001   left   TONSILLECTOMY     Family History  Problem Relation Age of Onset   Heart disease Mother        CHF   Hypertension Mother    Stroke Father    Hypertension Father    Osteoporosis Father    Heart disease Father        CHF   Breast cancer Cousin    Colon cancer Neg Hx    Social History   Socioeconomic History   Marital status: Married    Spouse name: Not on file   Number of children: Not on file   Years of  education: Not on file   Highest education level: Not on file  Occupational History   Not on file  Tobacco Use   Smoking status: Never   Smokeless tobacco: Never  Vaping Use   Vaping Use: Never used  Substance and Sexual Activity   Alcohol use: No    Alcohol/week: 0.0 standard drinks   Drug use: No   Sexual activity: Not on file  Other Topics Concern   Not on file  Social History Narrative   Not on file   Social Determinants of Health   Financial Resource Strain: Low Risk    Difficulty of Paying Living Expenses: Not hard at all  Food Insecurity: No Food Insecurity   Worried About Charity fundraiser in the Last Year: Never true   [place] in the Last Year: Never true  Transportation Needs: No Transportation Needs   Lack of Transportation (Medical): No   Lack of Transportation (Non-Medical): No  Physical Activity: Sufficiently Active   Days of Exercise per Week: 7 days   Minutes of Exercise per Session: 60 min  Stress: No Stress Concern Present   Feeling of Stress : Not at all  Social Connections: Not on file    Tobacco Counseling Counseling given: Not Answered   Clinical Intake:  Pre-visit preparation completed: Yes  Pain : No/denies pain     Nutritional Risks: None Diabetes: No  How often do you need to have someone help you when you read instructions, pamphlets, or other written materials from your doctor or pharmacy?: 1 - Never  Diabetic: No Nutrition Risk Assessment:  Has the patient had any N/V/D within the last 2 months?  No  Does the patient have any non-healing wounds?  No  Has the patient had any unintentional weight loss or weight gain?  No   Diabetes:  Is the patient diabetic?  No  If diabetic, was a CBG obtained today?   N/A Did the patient bring in their glucometer from home?   N/A How often do you monitor your CBG's? N/A.   Financial Strains and Diabetes Management:  Are you having any financial strains with the device, your  supplies or your medication?  N/A .  Does the patient want to be seen by Chronic Care Management for management of their diabetes?   N/A Would the patient like to be referred to a Nutritionist or for Diabetic Management?   N/A     Interpreter Needed?: No  Information entered by :: CJohnson, RN   Activities of Daily Living In your present state of health, do you have any difficulty performing the following activities: 04/18/2021  Hearing? Y  Vision? N  Difficulty concentrating or making decisions? N  Walking or climbing stairs? N  Dressing or bathing? N  Doing errands, shopping? N  Preparing Food and eating ?  N  Using the Toilet? N  In the past six months, have you accidently leaked urine? N  Do you have problems with loss of bowel control? N  Managing your Medications? N  Managing your Finances? N  Housekeeping or managing your Housekeeping? N  Some recent data might be hidden    Patient Care Team: Tower, Wynelle Fanny, MD as PCP - General Leandrew Koyanagi, MD as Referring Physician (Ophthalmology)  Indicate any recent Medical Services you may have received from other than [facility] providers in the past year (date may be approximate).     Assessment:   This is a routine wellness examination for [place].  Hearing/Vision screen Vision Screening - Comments:: Patient gets annual eye exams   Dietary issues and exercise activities discussed: Current Exercise Habits: Home exercise routine, Type of exercise: walking, Time (Minutes): 60, Frequency (Times/Week): 7, Weekly Exercise (Minutes/Week): 420, Intensity: Moderate, Exercise limited by: None identified   Goals Addressed             This Visit's Progress    Patient Stated       04/18/2021, I will continue to walk daily for 2- 2 1/2 miles.       Depression Screen PHQ 2/9 Scores 04/18/2021 07/05/2019 06/26/2018 06/18/2017 06/13/2015 05/30/2014 05/28/2013  PHQ - 2 Score 0 0 0 0 0 0 0  PHQ- 9 Score 0 0 0 0 - - -    Fall  Risk Fall Risk  04/18/2021 07/05/2019 06/26/2018 06/18/2017 09/05/2016  Falls in the past year? 0 1 No No No  Comment - - - - Emmi Telephone Survey: data to providers prior to load  Number falls in past yr: 0 0 - - -  Injury with Fall? 0 0 - - -  Risk for fall due to : No Fall Risks Impaired balance/gait - - -  Follow up Falls evaluation completed;Falls prevention discussed Falls evaluation completed;Falls prevention discussed - - -    FALL RISK PREVENTION PERTAINING TO THE HOME:  Any stairs in or around the home? Yes  If so, are there any without handrails? No  Home free of loose throw rugs in walkways, pet beds, electrical cords, etc? Yes  Adequate lighting in your home to reduce risk of falls? Yes   ASSISTIVE DEVICES UTILIZED TO PREVENT FALLS:  Life alert? No  Use of a cane, walker or w/c? No  Grab bars in the bathroom? No  Shower chair or bench in shower? No  Elevated toilet seat or a handicapped toilet? No   TIMED UP AND GO:  Was the test performed?  N/A telephone visit .    Cognitive Function: MMSE - Mini Mental State Exam 04/18/2021 07/05/2019 06/26/2018 06/18/2017  Orientation to time 5 5 5 5   Orientation to Place 5 5 5 5   Registration 3 3 3 3   Attention/ Calculation 5 5 0 0  Recall 3 3 3 3   Language- name 2 objects - - 0 0  Language- repeat 1 1 1 1   Language- follow 3 step command - - 3 3  Language- read & follow direction - - 0 0  Write a sentence - - 0 0  Copy design - - 0 0  Total score - - 20 20  Mini Cog  Mini-Cog screen was completed. Maximum score is 22. A value of 0 denotes this part of the MMSE was not completed or the patient failed this part of the Mini-Cog screening.       Immunizations Immunization  History  Administered Date(s) Administered   Fluad Quad(high Dose 65+) 06/09/2019   Influenza, High Dose Seasonal PF 06/04/2017, 06/18/2018, 06/02/2020   Influenza,inj,Quad PF,6+ Mos 05/30/2014, 06/13/2015, 06/17/2016   Influenza-Unspecified 06/17/2013    PFIZER(Purple Top)SARS-COV-2 Vaccination 10/05/2019, 10/26/2019   Pneumococcal Conjugate-13 05/30/2014   Pneumococcal Polysaccharide-23 05/09/2008   Td 02/25/2003   Zoster Recombinat (Shingrix) 01/14/2017, 07/17/2017   Zoster, Live 04/17/2006    TDAP status: Up to date  Flu Vaccine status: Up to date  Pneumococcal vaccine status: Up to date  Covid-19 vaccine status: Completed 2 vaccines  Qualifies for Shingles Vaccine? Yes   Zostavax completed No   Shingrix Completed?: Yes  Screening Tests Health Maintenance  Topic Date Due   COVID-19 Vaccine (3 - Pfizer risk series) 11/23/2019   INFLUENZA VACCINE  04/30/2021   MAMMOGRAM  07/18/2021   TETANUS/TDAP  06/04/2023   DEXA SCAN  Completed   PNA vac Low Risk Adult  Completed   Zoster Vaccines- Shingrix  Completed   HPV VACCINES  Aged Out    Health Maintenance  Health Maintenance Due  Topic Date Due   COVID-19 Vaccine (3 - Pfizer risk series) 11/23/2019    Colorectal cancer screening: No longer required.   Mammogram status: Completed 07/18/2020. Repeat every year  Bone Density status: due, will discuss with provider at physical   Lung Cancer Screening: (Low Dose CT Chest recommended if Age 61-80 years, 30 pack-year currently smoking OR have quit w/in 15years.) does not qualify.   Additional Screening:  Hepatitis C Screening: does not qualify; Completed N/A  Vision Screening: Recommended annual ophthalmology exams for early detection of glaucoma and other disorders of the eye. Is the patient up to date with their annual eye exam?  Yes  Who is the provider or what is the name of the office in which the patient attends annual eye exams? Dr. Wallace Going, [facility] If pt is not established with a provider, would they like to be referred to a provider to establish care? No .   Dental Screening: Recommended annual dental exams for proper oral hygiene  Community Resource Referral / Chronic Care Management: CRR  required this visit?  No   CCM required this visit?  No      Plan:     I have personally reviewed and noted the following in the patient's chart:   Medical and social history Use of alcohol, tobacco or illicit drugs  Current medications and supplements including opioid prescriptions.  Functional ability and status Nutritional status Physical activity Advanced directives List of other physicians Hospitalizations, surgeries, and ER visits in previous 12 months Vitals Screenings to include cognitive, depression, and falls Referrals and appointments  In addition, I have reviewed and discussed with patient certain preventive protocols, quality metrics, and best practice recommendations. A written personalized care plan for preventive services as well as general preventive health recommendations were provided to patient.   Due to this being a telephonic visit, the after visit summary with patients personalized plan was offered to patient via office or my-chart. Patient preferred to pick up at office at next visit or via mychart.   Andrez Grime, LPN   [id number]/[id number]

## 2021-04-18 NOTE — Progress Notes (Signed)
PCP notes:  Health Maintenance: Dexa- due   Abnormal Screenings: none   Patient concerns: none   Nurse concerns: none   Next PCP appt.: 04/26/2021 @ 8:30 am

## 2021-04-26 ENCOUNTER — Encounter: Payer: Medicare Other | Admitting: Family Medicine

## 2021-05-01 DIAGNOSIS — H401132 Primary open-angle glaucoma, bilateral, moderate stage: Secondary | ICD-10-CM | POA: Diagnosis not present

## 2021-05-08 ENCOUNTER — Telehealth (INDEPENDENT_AMBULATORY_CARE_PROVIDER_SITE_OTHER): Payer: Self-pay | Admitting: Vascular Surgery

## 2021-05-08 NOTE — Telephone Encounter (Signed)
Documentation only.

## 2021-05-18 DIAGNOSIS — H401132 Primary open-angle glaucoma, bilateral, moderate stage: Secondary | ICD-10-CM | POA: Diagnosis not present

## 2021-05-25 ENCOUNTER — Encounter: Payer: Medicare Other | Admitting: Family Medicine

## 2021-05-29 ENCOUNTER — Other Ambulatory Visit: Payer: Self-pay

## 2021-05-29 ENCOUNTER — Ambulatory Visit (INDEPENDENT_AMBULATORY_CARE_PROVIDER_SITE_OTHER): Payer: Medicare Other | Admitting: Vascular Surgery

## 2021-05-29 VITALS — BP 151/72 | HR 67 | Ht 66.0 in | Wt 102.0 lb

## 2021-05-29 DIAGNOSIS — I83811 Varicose veins of right lower extremities with pain: Secondary | ICD-10-CM

## 2021-05-29 NOTE — Progress Notes (Signed)
  Kathy [name] is a 80 y.o. female who presents with symptomatic venous reflux  Past Medical History:  Diagnosis Date   Cancer ([place])    Hx of breast CA   Glaucoma    Hyperlipidemia    Osteoporosis     Past Surgical History:  Procedure Laterality Date   BREAST SURGERY  04/06/2001   mastectomy - left side   DILATION AND CURETTAGE OF UTERUS     EYE SURGERY  2010   cataract - left   MASTECTOMY  03/2001   left   TONSILLECTOMY       Current Outpatient Medications:    ALPRAZolam (XANAX) 0.5 MG tablet, Take by mouth., Disp: , Rfl:    aspirin 81 MG tablet, Take 81 mg by mouth daily., Disp: , Rfl:    bimatoprost (LUMIGAN) 0.03 % ophthalmic solution, Place 1 drop into both eyes at bedtime., Disp: , Rfl:    Brinzolamide-Brimonidine (SIMBRINZA OP), Place 1 drop into both eyes 2 (two) times daily., Disp: , Rfl:    Calcium Carbonate-Vitamin D (CALCIUM-VITAMIN D3) 600-125 MG-UNIT TABS, Take by mouth., Disp: , Rfl:    cholecalciferol (VITAMIN D) 1000 units tablet, Take 5,000 Units by mouth daily., Disp: , Rfl:    cyanocobalamin 1000 MCG tablet, Take 100 mcg by mouth daily., Disp: , Rfl:    dorzolamide-timolol (COSOPT) 22.3-6.8 MG/ML ophthalmic solution, Place 22.3 mLs into both eyes 2 (two) times daily., Disp: , Rfl:    fish oil-omega-3 fatty acids 1000 MG capsule, Take 1 g by mouth daily., Disp: , Rfl:    Multiple Vitamin (MULTIVITAMIN) capsule, Take 1 capsule by mouth daily., Disp: , Rfl:    Vitamin D, Ergocalciferol, (DRISDOL) 50000 units CAPS capsule, Take 50,000 Units by mouth every 7 (seven) days., Disp: , Rfl:    meclizine (ANTIVERT) 25 MG tablet, Take 1 tablet (25 mg total) by mouth 3 (three) times daily as needed for dizziness (vertigo). (Patient not taking: No sig reported), Disp: 20 tablet, Rfl: 0   predniSONE (DELTASONE) 10 MG tablet, Take 3 pills once daily by mouth for 3 days, then 2 pills once daily for 3 days, then 1 pill once daily for 3 days and then stop (Patient not taking:  No sig reported), Disp: 18 tablet, Rfl: 0  No Known Allergies   Varicose veins of leg with pain, right     PLAN: The patient's right lower extremity was sterilely prepped and draped. The ultrasound machine was used to visualize the saphenous vein throughout its course. A segment in the upper calf was selected for access. The saphenous vein was accessed without difficulty using ultrasound guidance with a micropuncture needle. A 0.018 wire was then placed beyond the saphenofemoral junction and the needle was removed. The 65 cm sheath was then placed over the wire and the wire and dilator were removed. The laser fiber was then placed through the sheath and its tip was placed approximately 4-5 centimeters below the saphenofemoral junction. Tumescent anesthesia was then created with a dilute lidocaine solution. Laser energy was then delivered with constant withdrawal of the sheath and laser fiber. Approximately 1477 joules of energy were delivered over a length of 35 centimeters using a 1470 Hz VenaCure machine at 7 W. Sterile dressings were placed. The patient tolerated the procedure well without obvious complications.   Follow-up in 1 week with post-laser duplex.

## 2021-06-05 ENCOUNTER — Ambulatory Visit (INDEPENDENT_AMBULATORY_CARE_PROVIDER_SITE_OTHER): Payer: Medicare Other

## 2021-06-05 ENCOUNTER — Other Ambulatory Visit: Payer: Self-pay

## 2021-06-05 DIAGNOSIS — I83811 Varicose veins of right lower extremities with pain: Secondary | ICD-10-CM | POA: Diagnosis not present

## 2021-06-13 ENCOUNTER — Encounter: Payer: Medicare Other | Admitting: Family Medicine

## 2021-06-18 ENCOUNTER — Other Ambulatory Visit: Payer: Self-pay | Admitting: Family Medicine

## 2021-06-18 DIAGNOSIS — Z1231 Encounter for screening mammogram for malignant neoplasm of breast: Secondary | ICD-10-CM

## 2021-06-19 ENCOUNTER — Ambulatory Visit (INDEPENDENT_AMBULATORY_CARE_PROVIDER_SITE_OTHER): Payer: Medicare Other | Admitting: Vascular Surgery

## 2021-06-19 ENCOUNTER — Encounter: Payer: Medicare Other | Admitting: Family Medicine

## 2021-06-19 ENCOUNTER — Other Ambulatory Visit: Payer: Self-pay

## 2021-06-19 ENCOUNTER — Encounter (INDEPENDENT_AMBULATORY_CARE_PROVIDER_SITE_OTHER): Payer: Self-pay | Admitting: Vascular Surgery

## 2021-06-19 VITALS — BP 157/69 | HR 62 | Resp 15 | Wt 100.8 lb

## 2021-06-19 DIAGNOSIS — I83811 Varicose veins of right lower extremities with pain: Secondary | ICD-10-CM | POA: Diagnosis not present

## 2021-06-19 DIAGNOSIS — E785 Hyperlipidemia, unspecified: Secondary | ICD-10-CM | POA: Diagnosis not present

## 2021-06-19 NOTE — Assessment & Plan Note (Signed)
lipid control important in reducing the progression of atherosclerotic disease. Continue statin therapy  

## 2021-06-19 NOTE — Assessment & Plan Note (Signed)
The patient has undergone successful laser ablation of the right great saphenous vein with improvement in her pain and swelling in her right leg.  She does still have painful prominent varicosities particular in the medial right calf no benefit from sclerotherapy in her case largely foam sclerotherapy.  She is interested in having this done this will be scheduled in the near future at her convenience.

## 2021-06-19 NOTE — Progress Notes (Signed)
MRN : [id number]  Kathy [name] is a 80 y.o. (12/07/1940) female who presents with chief complaint of  Chief Complaint  Patient presents with   Follow-up    4 wk post laser follow up  .  History of Present Illness: Patient returns today in follow up of venous insufficiency.  She is about a month status post right great saphenous vein laser ablation for symptomatic reflux.  She is doing well.  She does notice improvement in the pain and swelling in her leg although she does have some prominent painful residual varicosities present particularly in the medial right calf.  She had a successful ablation without DVT on follow-up study.  Current Outpatient Medications  Medication Sig Dispense Refill   aspirin 81 MG tablet Take 81 mg by mouth daily.     bimatoprost (LUMIGAN) 0.03 % ophthalmic solution Place 1 drop into both eyes at bedtime.     Brinzolamide-Brimonidine (SIMBRINZA OP) Place 1 drop into both eyes 2 (two) times daily.     Calcium Carbonate-Vitamin D (CALCIUM-VITAMIN D3) 600-125 MG-UNIT TABS Take by mouth.     cholecalciferol (VITAMIN D) 1000 units tablet Take 5,000 Units by mouth daily.     cyanocobalamin 1000 MCG tablet Take 100 mcg by mouth daily.     dorzolamide-timolol (COSOPT) 22.3-6.8 MG/ML ophthalmic solution Place 22.3 mLs into both eyes 2 (two) times daily.     fish oil-omega-3 fatty acids 1000 MG capsule Take 1 g by mouth daily.     Multiple Vitamin (MULTIVITAMIN) capsule Take 1 capsule by mouth daily.     Vitamin D, Ergocalciferol, (DRISDOL) 50000 units CAPS capsule Take 50,000 Units by mouth every 7 (seven) days.     ALPRAZolam (XANAX) 0.5 MG tablet Take by mouth. (Patient not taking: Reported on 06/19/2021)     meclizine (ANTIVERT) 25 MG tablet Take 1 tablet (25 mg total) by mouth 3 (three) times daily as needed for dizziness (vertigo). (Patient not taking: No sig reported) 20 tablet 0   predniSONE (DELTASONE) 10 MG tablet Take 3 pills once daily by mouth for 3  days, then 2 pills once daily for 3 days, then 1 pill once daily for 3 days and then stop (Patient not taking: No sig reported) 18 tablet 0   No current facility-administered medications for this visit.    Past Medical History:  Diagnosis Date   Cancer ([place])    Hx of breast CA   Glaucoma    Hyperlipidemia    Osteoporosis     Past Surgical History:  Procedure Laterality Date   BREAST SURGERY  04/06/2001   mastectomy - left side   DILATION AND CURETTAGE OF UTERUS     EYE SURGERY  2010   cataract - left   MASTECTOMY  03/2001   left   TONSILLECTOMY       Social History   Tobacco Use   Smoking status: Never   Smokeless tobacco: Never  Vaping Use   Vaping Use: Never used  Substance Use Topics   Alcohol use: No    Alcohol/week: 0.0 standard drinks   Drug use: No       Family History  Problem Relation Age of Onset   Heart disease Mother        CHF   Hypertension Mother    Stroke Father    Hypertension Father    Osteoporosis Father    Heart disease Father        CHF   Breast cancer  Cousin    Colon cancer Neg Hx      No Known Allergies   REVIEW OF SYSTEMS (Negative unless checked)  Constitutional: [] Weight loss  [] Fever  [] Chills Cardiac: [] Chest pain   [] Chest pressure   [] Palpitations   [] Shortness of breath when laying flat   [] Shortness of breath at rest   [] Shortness of breath with exertion. Vascular:  [] Pain in legs with walking   [] Pain in legs at rest   [] Pain in legs when laying flat   [] Claudication   [] Pain in feet when walking  [] Pain in feet at rest  [] Pain in feet when laying flat   [] History of DVT   [] Phlebitis   [x] Swelling in legs   [x] Varicose veins   [] Non-healing ulcers Pulmonary:   [] Uses home oxygen   [] Productive cough   [] Hemoptysis   [] Wheeze  [] COPD   [] Asthma Neurologic:  [] Dizziness  [] Blackouts   [] Seizures   [] History of stroke   [] History of TIA  [] Aphasia   [] Temporary blindness   [] Dysphagia   [] Weakness or numbness in arms    [] Weakness or numbness in legs Musculoskeletal:  [x] Arthritis   [] Joint swelling   [] Joint pain   [] Low back pain Hematologic:  [] Easy bruising  [] Easy bleeding   [] Hypercoagulable state   [] Anemic   Gastrointestinal:  [] Blood in stool   [] Vomiting blood  [] Gastroesophageal reflux/heartburn   [] Abdominal pain Genitourinary:  [] Chronic kidney disease   [] Difficult urination  [] Frequent urination  [] Burning with urination   [] Hematuria Skin:  [] Rashes   [] Ulcers   [] Wounds Psychological:  [] History of anxiety   []  History of major depression.  Physical Examination  BP (!) 157/69 (BP Location: Right Arm)   Pulse 62   Resp 15   Wt 100 lb 12.8 oz (45.7 kg)   BMI 16.27 kg/m  Gen:  WD/WN, NAD.  Appears younger than stated age Head: [place]/AT, No temporalis wasting. Ear/Nose/Throat: Hearing grossly intact, nares w/o erythema or drainage Eyes: Conjunctiva clear. Sclera non-icteric Neck: Supple.  Trachea midline Pulmonary:  Good air movement, no use of accessory muscles.  Cardiac: RRR, no JVD Vascular:  Vessel Right Left  Radial Palpable Palpable                          PT Palpable Palpable  DP Palpable Palpable   Gastrointestinal: soft, non-tender/non-distended. No guarding/reflex.  Musculoskeletal: M/S 5/5 throughout.  No deformity or atrophy.  Prominent residual varicosities particular in the right medial calf measuring up to 3 mm.  Trace right lower extremity edema. Neurologic: Sensation grossly intact in extremities.  Symmetrical.  Speech is fluent.  Psychiatric: Judgment intact, Mood & affect appropriate for pt's clinical situation. Dermatologic: No rashes or ulcers noted.  No cellulitis or open wounds.      Labs Recent Results (from the past 2160 hour(s))  Basic Metabolic Panel     Status: Abnormal   Collection Time: 03/26/21  9:02 AM  Result Value Ref Range   Sodium 140 135 - 145 mEq/L   Potassium 3.7 3.5 - 5.1 mEq/L   Chloride 106 96 - 112 mEq/L   CO2 27 19 - 32 mEq/L    Glucose, Bld 60 (L) 70 - 99 mg/dL   BUN 19 6 - 23 mg/dL   Creatinine, Ser 0.61 0.40 - 1.20 mg/dL   GFR 84.15 >60.00 mL/min    Comment: Calculated using the CKD-EPI Creatinine Equation (2021)   Calcium 9.1 8.4 - 10.5 mg/dL  VITAMIN D 25 Hydroxy (Vit-D Deficiency, Fractures)     Status: None   Collection Time: 04/18/21  8:54 AM  Result Value Ref Range   VITD 62.64 30.00 - 100.00 ng/mL  TSH     Status: None   Collection Time: 04/18/21  8:54 AM  Result Value Ref Range   TSH 2.14 0.35 - 5.50 uIU/mL  Lipid panel     Status: Abnormal   Collection Time: 04/18/21  8:54 AM  Result Value Ref Range   Cholesterol 204 (H) 0 - 200 mg/dL    Comment: ATP III Classification       Desirable:  < 200 mg/dL               Borderline High:  200 - 239 mg/dL          High:  > = 240 mg/dL   Triglycerides 83.0 0.0 - 149.0 mg/dL    Comment: Normal:  <150 mg/dLBorderline High:  150 - 199 mg/dL   HDL 89.30 >39.00 mg/dL   VLDL 16.6 0.0 - 40.0 mg/dL   LDL Cholesterol 98 0 - 99 mg/dL   Total CHOL/HDL Ratio 2     Comment:                Men          Women1/2 Average Risk     3.4          3.3Average Risk          5.0          4.42X Average Risk          9.6          7.13X Average Risk          15.0          11.0                       NonHDL 114.61     Comment: NOTE:  Non-HDL goal should be 30 mg/dL higher than patient's LDL goal (i.e. LDL goal of < 70 mg/dL, would have non-HDL goal of < 100 mg/dL)  Comprehensive metabolic panel     Status: Abnormal   Collection Time: 04/18/21  8:54 AM  Result Value Ref Range   Sodium 143 135 - 145 mEq/L   Potassium 3.8 3.5 - 5.1 mEq/L   Chloride 108 96 - 112 mEq/L   CO2 27 19 - 32 mEq/L   Glucose, Bld 80 70 - 99 mg/dL   BUN 20 6 - 23 mg/dL   Creatinine, Ser 0.62 0.40 - 1.20 mg/dL   Total Bilirubin 0.5 0.2 - 1.2 mg/dL   Alkaline Phosphatase 40 39 - 117 U/L   AST 17 0 - 37 U/L   ALT 12 0 - 35 U/L   Total Protein 5.9 (L) 6.0 - 8.3 g/dL   Albumin 4.0 3.5 - 5.2 g/dL   GFR  83.78 >60.00 mL/min    Comment: Calculated using the CKD-EPI Creatinine Equation (2021)   Calcium 9.0 8.4 - 10.5 mg/dL  CBC with Differential/Platelet     Status: Abnormal   Collection Time: 04/18/21  8:54 AM  Result Value Ref Range   WBC 4.6 4.0 - 10.5 K/uL   RBC 4.08 3.87 - 5.11 Mil/uL   Hemoglobin 12.9 12.0 - 15.0 g/dL   HCT 38.5 36.0 - 46.0 %   MCV 94.4 78.0 - 100.0 fl   MCHC 33.6 30.0 - 36.0 g/dL  RDW 13.8 11.5 - 15.5 %   Platelets 142.0 (L) 150.0 - 400.0 K/uL   Neutrophils Relative % 56.2 43.0 - 77.0 %   Lymphocytes Relative 29.3 12.0 - 46.0 %   Monocytes Relative 8.0 3.0 - 12.0 %   Eosinophils Relative 5.6 (H) 0.0 - 5.0 %   Basophils Relative 0.9 0.0 - 3.0 %   Neutro Abs 2.6 1.4 - 7.7 K/uL   Lymphs Abs 1.3 0.7 - 4.0 K/uL   Monocytes Absolute 0.4 0.1 - 1.0 K/uL   Eosinophils Absolute 0.3 0.0 - 0.7 K/uL   Basophils Absolute 0.0 0.0 - 0.1 K/uL    Radiology VAS [place] LASER ABLATION OF SUPERFICIAL VEIN  Result Date: 06/12/2021  Lower Venous Study Patient Name:  WANETTA FUNDERBURKE  Date of Exam:   06/05/2021 Medical Rec #: [id number]           Accession #:    [id number] Date of Birth: [birth year]           Patient Gender: F Patient Age:   30 years Exam Location:  [facility] Procedure:      VAS [place] LASER ABLATION OF SUPERFICIAL VEIN Referring Phys: Leotis Pain --------------------------------------------------------------------------------  Indications: Post-op.  Performing Technologist: Charlane Ferretti RT (R)(VS)  Examination Guidelines: A complete evaluation includes B-mode imaging, spectral Doppler, color Doppler, and power Doppler as needed of all accessible portions of each vessel. Bilateral testing is considered an integral part of a complete examination. Limited examinations for reoccurring indications may be performed as noted.  +---------+---------------+---------+-----------+----------+--------------+ RIGHT    CompressibilityPhasicitySpontaneityPropertiesThrombus  Aging +---------+---------------+---------+-----------+----------+--------------+ CFV      Full                                                        +---------+---------------+---------+-----------+----------+--------------+ SFJ      None                                                        +---------+---------------+---------+-----------+----------+--------------+ FV Prox  Full                                                        +---------+---------------+---------+-----------+----------+--------------+ FV Mid   Full                                                        +---------+---------------+---------+-----------+----------+--------------+ FV DistalFull                                                        +---------+---------------+---------+-----------+----------+--------------+ POP      Full                                                        +---------+---------------+---------+-----------+----------+--------------+  GSV      None                                                        +---------+---------------+---------+-----------+----------+--------------+ SSV      Full                                                        +---------+---------------+---------+-----------+----------+--------------+  Summary: Right: Right GSV is non compressable from proximal calf to approximately .16cm of SFJ. Successful vein closure.  *See table(s) above for measurements and observations. Electronically signed by Leotis Pain MD on 06/12/2021 at 12:48:59 PM.    Final     Assessment/Plan  Hyperlipidemia lipid control important in reducing the progression of atherosclerotic disease. Continue statin therapy   Varicose veins of leg with pain, right The patient has undergone successful laser ablation of the right great saphenous vein with improvement in her pain and swelling in her right leg.  She does still have painful prominent varicosities  particular in the medial right calf no benefit from sclerotherapy in her case largely foam sclerotherapy.  She is interested in having this done this will be scheduled in the near future at her convenience.    Leotis Pain, MD  06/19/2021 1:33 PM    This note was created with Dragon medical transcription system.  Any errors from dictation are purely unintentional

## 2021-06-22 DIAGNOSIS — H401132 Primary open-angle glaucoma, bilateral, moderate stage: Secondary | ICD-10-CM | POA: Diagnosis not present

## 2021-06-25 DIAGNOSIS — Z23 Encounter for immunization: Secondary | ICD-10-CM | POA: Diagnosis not present

## 2021-07-09 ENCOUNTER — Encounter: Payer: Self-pay | Admitting: Family Medicine

## 2021-07-09 ENCOUNTER — Ambulatory Visit (INDEPENDENT_AMBULATORY_CARE_PROVIDER_SITE_OTHER): Payer: Medicare Other | Admitting: Family Medicine

## 2021-07-09 ENCOUNTER — Other Ambulatory Visit: Payer: Self-pay

## 2021-07-09 VITALS — BP 132/70 | HR 72 | Temp 98.5°F | Ht 66.0 in | Wt 104.0 lb

## 2021-07-09 DIAGNOSIS — Z13220 Encounter for screening for lipoid disorders: Secondary | ICD-10-CM | POA: Diagnosis not present

## 2021-07-09 DIAGNOSIS — E785 Hyperlipidemia, unspecified: Secondary | ICD-10-CM | POA: Diagnosis not present

## 2021-07-09 DIAGNOSIS — D696 Thrombocytopenia, unspecified: Secondary | ICD-10-CM

## 2021-07-09 DIAGNOSIS — H903 Sensorineural hearing loss, bilateral: Secondary | ICD-10-CM | POA: Diagnosis not present

## 2021-07-09 DIAGNOSIS — R636 Underweight: Secondary | ICD-10-CM | POA: Diagnosis not present

## 2021-07-09 DIAGNOSIS — E559 Vitamin D deficiency, unspecified: Secondary | ICD-10-CM | POA: Diagnosis not present

## 2021-07-09 DIAGNOSIS — M810 Age-related osteoporosis without current pathological fracture: Secondary | ICD-10-CM | POA: Diagnosis not present

## 2021-07-09 DIAGNOSIS — E2839 Other primary ovarian failure: Secondary | ICD-10-CM

## 2021-07-09 DIAGNOSIS — Z853 Personal history of malignant neoplasm of breast: Secondary | ICD-10-CM

## 2021-07-09 NOTE — Assessment & Plan Note (Signed)
Pt struggles more with this, discussed safety issues Ref done for audiology eval, she would consider hearing aides

## 2021-07-09 NOTE — Assessment & Plan Note (Signed)
Baseline platelet ct of 142 No excessive bleeding or bruising

## 2021-07-09 NOTE — Assessment & Plan Note (Signed)
Level of 62 with current supplementation  Vitamin D level is therapeutic with current supplementation Disc importance of this to bone and overall health

## 2021-07-09 NOTE — Progress Notes (Signed)
Subjective:    Patient ID: Kathy [name], female    DOB: 1941-01-25, 80 y.o.   MRN: [id number]  HPI Pt presents for annual f/u of chronic medical problems  Wt Readings from Last 3 Encounters:  07/09/21 104 lb (47.2 kg)  06/19/21 100 lb 12.8 oz (45.7 kg)  05/29/21 102 lb (46.3 kg)   16.79 kg/m Eats a lot and had a fast metabolism   Doing well overall  Feels fine   Did amw on 04/18/21  Covid vaccinated Flu shot- sept in [facility] [place] [place]  Had shingrix vaccines  Pna vaccines utd  Mammogram 10/21, next one is scheduled for oct 20th Past L mastectomy for personal h/o breast cancer  Self breast exam- no changes on self exam   Td 5/04   postponed for insurance  Colon cancer screening  Cologuard neg 10/19  No problems Would rather not do another one at her age   Dexa 11/19 Osteoporosis  Remote hx of vert compression fracture Full courses of alendronate and evista in the past  Prolia currently   Falls none Fractures none new  Supplements vit D D level is 62.6 Exercise  walks every day  2-2.5 miles in neighborhood  Has not been back to the gym yet  PHQ Depression screen [facility] 2/9 04/18/2021 07/05/2019 06/26/2018 06/18/2017 06/13/2015  Decreased Interest 0 0 0 0 0  Down, Depressed, Hopeless 0 0 0 0 0  PHQ - 2 Score 0 0 0 0 0  Altered sleeping 0 0 0 0 -  Tired, decreased energy 0 0 0 0 -  Change in appetite 0 0 0 0 -  Feeling bad or failure about yourself  0 0 0 0 -  Trouble concentrating 0 0 0 0 -  Moving slowly or fidgety/restless 0 0 0 0 -  Suicidal thoughts 0 0 0 0 -  PHQ-9 Score 0 0 0 0 -  Difficult doing work/chores Not difficult at all Not difficult at all Not difficult at all Not difficult at all -    Known hearing loss  Not great per pt  May be open to hearing aides later    Had some ablation of varicose veins  Scheduling sclerotx also with Dr Lucky Cowboy  BP Readings from Last 3 Encounters:  07/09/21 (!) 168/76  06/19/21 (!) 157/69  05/29/21 (!) 151/72    Pulse Readings from Last 3 Encounters:  07/09/21 72  06/19/21 62  05/29/21 67   No bp problems in the past    Hyperlipidemia  Lab Results  Component Value Date   CHOL 204 (H) 04/18/2021   CHOL 202 (H) 07/06/2019   CHOL 169 06/26/2018   Lab Results  Component Value Date   HDL 89.30 04/18/2021   HDL 84.60 07/06/2019   HDL 80.10 06/26/2018   Lab Results  Component Value Date   LDLCALC 98 04/18/2021   LDLCALC 100 (H) 07/06/2019   LDLCALC 78 06/26/2018   Lab Results  Component Value Date   TRIG 83.0 04/18/2021   TRIG 86.0 07/06/2019   TRIG 56.0 06/26/2018   Lab Results  Component Value Date   CHOLHDL 2 04/18/2021   CHOLHDL 2 07/06/2019   CHOLHDL 2 06/26/2018   Lab Results  Component Value Date   LDLDIRECT 107.3 05/24/2013   LDLDIRECT 99.8 05/19/2012   LDLDIRECT 101.6 05/17/2011     Other labs Results for orders placed or performed in visit on 04/18/21  VITAMIN D 25 Hydroxy (Vit-D Deficiency, Fractures)  Result  Value Ref Range   VITD 62.64 30.00 - 100.00 ng/mL  TSH  Result Value Ref Range   TSH 2.14 0.35 - 5.50 uIU/mL  Lipid panel  Result Value Ref Range   Cholesterol 204 (H) 0 - 200 mg/dL   Triglycerides 83.0 0.0 - 149.0 mg/dL   HDL 89.30 >39.00 mg/dL   VLDL 16.6 0.0 - 40.0 mg/dL   LDL Cholesterol 98 0 - 99 mg/dL   Total CHOL/HDL Ratio 2    NonHDL 114.61   Comprehensive metabolic panel  Result Value Ref Range   Sodium 143 135 - 145 mEq/L   Potassium 3.8 3.5 - 5.1 mEq/L   Chloride 108 96 - 112 mEq/L   CO2 27 19 - 32 mEq/L   Glucose, Bld 80 70 - 99 mg/dL   BUN 20 6 - 23 mg/dL   Creatinine, Ser 0.62 0.40 - 1.20 mg/dL   Total Bilirubin 0.5 0.2 - 1.2 mg/dL   Alkaline Phosphatase 40 39 - 117 U/L   AST 17 0 - 37 U/L   ALT 12 0 - 35 U/L   Total Protein 5.9 (L) 6.0 - 8.3 g/dL   Albumin 4.0 3.5 - 5.2 g/dL   GFR 83.78 >60.00 mL/min   Calcium 9.0 8.4 - 10.5 mg/dL  CBC with Differential/Platelet  Result Value Ref Range   WBC 4.6 4.0 - 10.5 K/uL    RBC 4.08 3.87 - 5.11 Mil/uL   Hemoglobin 12.9 12.0 - 15.0 g/dL   HCT 38.5 36.0 - 46.0 %   MCV 94.4 78.0 - 100.0 fl   MCHC 33.6 30.0 - 36.0 g/dL   RDW 13.8 11.5 - 15.5 %   Platelets 142.0 (L) 150.0 - 400.0 K/uL   Neutrophils Relative % 56.2 43.0 - 77.0 %   Lymphocytes Relative 29.3 12.0 - 46.0 %   Monocytes Relative 8.0 3.0 - 12.0 %   Eosinophils Relative 5.6 (H) 0.0 - 5.0 %   Basophils Relative 0.9 0.0 - 3.0 %   Neutro Abs 2.6 1.4 - 7.7 K/uL   Lymphs Abs 1.3 0.7 - 4.0 K/uL   Monocytes Absolute 0.4 0.1 - 1.0 K/uL   Eosinophils Absolute 0.3 0.0 - 0.7 K/uL   Basophils Absolute 0.0 0.0 - 0.1 K/uL    Baseline platelet ct No change in bleeding  Takes asa   Patient Active Problem List   Diagnosis Date Noted   Thrombocytopenia ([place]) 04/17/2021   Varicose veins of leg with pain, right 03/23/2021   Hyperlipidemia 02/27/2021   Right arm pain 07/13/2019   Hearing loss 06/29/2018   Colon cancer screening 06/29/2018   Underweight 06/17/2016   Varicose vein of leg 06/17/2016   Vitamin D deficiency 06/17/2016   Estrogen deficiency 06/13/2015   Routine general medical examination at a health care facility 06/05/2015   Encounter for Medicare annual wellness exam 05/23/2013   INTERMITTENT VERTIGO 06/26/2010   Disequilibrium 06/26/2010   H/O compression fracture of spine 05/10/2009   Pathologic fracture of vertebrae 05/10/2009   Screening for lipoid disorders 04/01/2007   OVERACTIVE BLADDER 04/01/2007   Osteoporosis 04/01/2007   Personal history of breast cancer 04/01/2007   Past Medical History:  Diagnosis Date   Cancer ([place])    Hx of breast CA   Glaucoma    Hyperlipidemia    Osteoporosis    Past Surgical History:  Procedure Laterality Date   BREAST SURGERY  04/06/2001   mastectomy - left side   DILATION AND CURETTAGE OF UTERUS  EYE SURGERY  2010   cataract - left   MASTECTOMY  03/2001   left   TONSILLECTOMY     Social History   Tobacco Use   Smoking status: Never    Smokeless tobacco: Never  Vaping Use   Vaping Use: Never used  Substance Use Topics   Alcohol use: No    Alcohol/week: 0.0 standard drinks   Drug use: No   Family History  Problem Relation Age of Onset   Heart disease Mother        CHF   Hypertension Mother    Stroke Father    Hypertension Father    Osteoporosis Father    Heart disease Father        CHF   Breast cancer Cousin    Colon cancer Neg Hx    No Known Allergies Current Outpatient Medications on File Prior to Visit  Medication Sig Dispense Refill   ALPRAZolam (XANAX) 0.5 MG tablet Take by mouth.     aspirin 81 MG tablet Take 81 mg by mouth daily.     bimatoprost (LUMIGAN) 0.03 % ophthalmic solution Place 1 drop into both eyes at bedtime.     Brinzolamide-Brimonidine (SIMBRINZA OP) Place 1 drop into both eyes 2 (two) times daily.     Calcium Carbonate-Vitamin D (CALCIUM-VITAMIN D3) 600-125 MG-UNIT TABS Take by mouth.     cholecalciferol (VITAMIN D) 1000 units tablet Take 5,000 Units by mouth daily.     cyanocobalamin 1000 MCG tablet Take 100 mcg by mouth daily.     dorzolamide-timolol (COSOPT) 22.3-6.8 MG/ML ophthalmic solution Place 22.3 mLs into both eyes 2 (two) times daily.     fish oil-omega-3 fatty acids 1000 MG capsule Take 1 g by mouth daily.     Multiple Vitamin (MULTIVITAMIN) capsule Take 1 capsule by mouth daily.     Vitamin D, Ergocalciferol, (DRISDOL) 50000 units CAPS capsule Take 50,000 Units by mouth every 7 (seven) days.     No current facility-administered medications on file prior to visit.      Review of Systems  Constitutional:  Negative for activity change, appetite change, fatigue, fever and unexpected weight change.  HENT:  Negative for congestion, ear pain, rhinorrhea, sinus pressure and sore throat.   Eyes:  Negative for pain, redness and visual disturbance.  Respiratory:  Negative for cough, shortness of breath and wheezing.   Cardiovascular:  Negative for chest pain and palpitations.        Varicose veins on legs   Gastrointestinal:  Negative for abdominal pain, blood in stool, constipation and diarrhea.  Endocrine: Negative for polydipsia and polyuria.  Genitourinary:  Negative for dysuria, frequency and urgency.  Musculoskeletal:  Negative for arthralgias, back pain and myalgias.       Some aches and pains  Skin:  Negative for pallor and rash.  Allergic/Immunologic: Negative for environmental allergies.  Neurological:  Negative for dizziness, syncope and headaches.  Hematological:  Negative for adenopathy. Does not bruise/bleed easily.  Psychiatric/Behavioral:  Negative for decreased concentration and dysphoric mood. The patient is not nervous/anxious.       Objective:   Physical Exam Constitutional:      General: She is not in acute distress.    Appearance: Normal appearance. She is well-developed and normal weight. She is not ill-appearing or diaphoretic.  HENT:     Head: Normocephalic and atraumatic.     Right Ear: Tympanic membrane, ear canal and external ear normal.     Left Ear: Tympanic membrane, ear  canal and external ear normal.     Nose: Nose normal. No congestion.     Mouth/Throat:     Mouth: Mucous membranes are moist.     Pharynx: Oropharynx is clear. No posterior oropharyngeal erythema.  Eyes:     General: No scleral icterus.    Extraocular Movements: Extraocular movements intact.     Conjunctiva/sclera: Conjunctivae normal.     Pupils: Pupils are equal, round, and reactive to light.  Neck:     Thyroid: No thyromegaly.     Vascular: No carotid bruit or JVD.  Cardiovascular:     Rate and Rhythm: Normal rate and regular rhythm.     Pulses: Normal pulses.     Heart sounds: Normal heart sounds.    No gallop.  Pulmonary:     Effort: Pulmonary effort is normal. No respiratory distress.     Breath sounds: Normal breath sounds. No wheezing.     Comments: Good air exch Chest:     Chest wall: No tenderness.  Abdominal:     General: Bowel sounds are  normal. There is no distension or abdominal bruit.     Palpations: Abdomen is soft. There is no mass.     Tenderness: There is no abdominal tenderness.     Hernia: No hernia is present.  Genitourinary:    Comments: Right Breast exam: No mass, nodules, thickening, tenderness, bulging, retraction, inflamation, nipple discharge or skin changes noted.  No axillary or clavicular LA.      L breast surgically absent No axillary lumps or changes Musculoskeletal:        General: No tenderness. Normal range of motion.     Cervical back: Normal range of motion and neck supple. No rigidity. No muscular tenderness.     Right lower leg: No edema.     Left lower leg: No edema.  Lymphadenopathy:     Cervical: No cervical adenopathy.  Skin:    General: Skin is warm and dry.     Coloration: Skin is not pale.     Findings: No erythema or rash.     Comments: Solar lentigines diffusely Some sks  Neurological:     Mental Status: She is alert. Mental status is at baseline.     Cranial Nerves: No cranial nerve deficit.     Motor: No abnormal muscle tone.     Coordination: Coordination normal.     Gait: Gait normal.     Deep Tendon Reflexes: Reflexes are normal and symmetric. Reflexes normal.  Psychiatric:        Mood and Affect: Mood normal.        Cognition and Memory: Cognition and memory normal.          Assessment & Plan:   Problem List Items Addressed This Visit       Nervous and Auditory   Hearing loss    Pt struggles more with this, discussed safety issues Ref done for audiology eval, she would consider hearing aides       Relevant Orders   Ambulatory referral to Audiology     Musculoskeletal and Integument   Osteoporosis - Primary    dexa due in November-ordered Rev last dexa  No new falls or fractures (remotely spinal compression fracture) S/p full course of alendronate and evista Now taking prolia every 6 mo  Good vit D intake and level is therapeutic  Daily exercise is  good  Disc fall prevention         Hematopoietic and  Hemostatic   Thrombocytopenia (HCC)    Baseline platelet ct of 142 No excessive bleeding or bruising         Other   Screening for lipoid disorders   Personal history of breast cancer   Estrogen deficiency   Relevant Orders   DG Bone Density   Underweight    Pt is concerned that she is overweight  Not actively trying to loose  Reassured her  Goal bmi over 19        Vitamin D deficiency    Level of 62 with current supplementation  Vitamin D level is therapeutic with current supplementation Disc importance of this to bone and overall health       Hyperlipidemia    Disc goals for lipids and reasons to control them Rev last labs with pt Rev low sat fat diet in detail

## 2021-07-09 NOTE — Assessment & Plan Note (Signed)
dexa due in November-ordered Rev last dexa  No new falls or fractures (remotely spinal compression fracture) S/p full course of alendronate and evista Now taking prolia every 6 mo  Good vit D intake and level is therapeutic  Daily exercise is good  Disc fall prevention

## 2021-07-09 NOTE — Assessment & Plan Note (Signed)
Disc goals for lipids and reasons to control them Rev last labs with pt Rev low sat fat diet in detail  

## 2021-07-09 NOTE — Assessment & Plan Note (Signed)
Pt is concerned that she is overweight  Not actively trying to loose  Reassured her  Goal bmi over 19

## 2021-07-09 NOTE — Patient Instructions (Addendum)
You are due for your bone density test in November   I placed a referral to audiology for hearing  You will get a call   Eat a healthy diet  Keep waling  Use sunscreen

## 2021-07-11 ENCOUNTER — Telehealth: Payer: Self-pay | Admitting: Family Medicine

## 2021-07-11 DIAGNOSIS — Z853 Personal history of malignant neoplasm of breast: Secondary | ICD-10-CM

## 2021-07-11 NOTE — Telephone Encounter (Signed)
Px written, in IN box Pt usually takes them to the same place-unsure if fax or pick up

## 2021-07-11 NOTE — Telephone Encounter (Signed)
Pt called in asking if the provider could write a prescription for 2 medical bras

## 2021-07-19 ENCOUNTER — Ambulatory Visit (INDEPENDENT_AMBULATORY_CARE_PROVIDER_SITE_OTHER)
Admission: RE | Admit: 2021-07-19 | Discharge: 2021-07-19 | Disposition: A | Discharge status: Home / Self Care | Payer: Medicare Other | Source: Ambulatory Visit | Attending: Family Medicine | Admitting: Family Medicine

## 2021-07-19 ENCOUNTER — Ambulatory Visit
Admission: RE | Admit: 2021-07-19 | Discharge: 2021-07-19 | Disposition: A | Discharge status: Home / Self Care | Payer: Medicare Other | Source: Ambulatory Visit | Attending: Family Medicine | Admitting: Family Medicine

## 2021-07-19 ENCOUNTER — Other Ambulatory Visit: Payer: Self-pay

## 2021-07-19 DIAGNOSIS — Z1231 Encounter for screening mammogram for malignant neoplasm of breast: Secondary | ICD-10-CM

## 2021-07-19 DIAGNOSIS — E2839 Other primary ovarian failure: Secondary | ICD-10-CM

## 2021-07-24 DIAGNOSIS — H7401 Tympanosclerosis, right ear: Secondary | ICD-10-CM | POA: Diagnosis not present

## 2021-07-24 DIAGNOSIS — H6121 Impacted cerumen, right ear: Secondary | ICD-10-CM | POA: Diagnosis not present

## 2021-07-24 DIAGNOSIS — H722X2 Other marginal perforations of tympanic membrane, left ear: Secondary | ICD-10-CM | POA: Diagnosis not present

## 2021-07-24 DIAGNOSIS — H906 Mixed conductive and sensorineural hearing loss, bilateral: Secondary | ICD-10-CM | POA: Diagnosis not present

## 2021-07-24 DIAGNOSIS — H903 Sensorineural hearing loss, bilateral: Secondary | ICD-10-CM | POA: Diagnosis not present

## 2021-07-27 ENCOUNTER — Other Ambulatory Visit (INDEPENDENT_AMBULATORY_CARE_PROVIDER_SITE_OTHER): Payer: Self-pay | Admitting: Nurse Practitioner

## 2021-08-06 DIAGNOSIS — Z20822 Contact with and (suspected) exposure to covid-19: Secondary | ICD-10-CM | POA: Diagnosis not present

## 2021-09-18 DIAGNOSIS — Z20822 Contact with and (suspected) exposure to covid-19: Secondary | ICD-10-CM | POA: Diagnosis not present

## 2021-10-12 ENCOUNTER — Telehealth: Payer: Self-pay

## 2021-10-12 DIAGNOSIS — Z853 Personal history of malignant neoplasm of breast: Secondary | ICD-10-CM

## 2021-10-12 NOTE — Telephone Encounter (Signed)
Order is done and in IN box

## 2021-10-12 NOTE — Telephone Encounter (Signed)
Pt needs annual order for 2 medical bras. Asks that they be mailed to her.

## 2021-10-15 NOTE — Telephone Encounter (Signed)
Mailed order to pt at address in chart.

## 2021-10-17 ENCOUNTER — Telehealth: Payer: Self-pay

## 2021-10-17 DIAGNOSIS — M810 Age-related osteoporosis without current pathological fracture: Secondary | ICD-10-CM

## 2021-10-17 NOTE — Telephone Encounter (Signed)
Benefit verification submitted-pending. Next injection due after 10/05/2021

## 2021-10-18 DIAGNOSIS — Z20822 Contact with and (suspected) exposure to covid-19: Secondary | ICD-10-CM | POA: Diagnosis not present

## 2021-10-24 NOTE — Telephone Encounter (Signed)
OOP cost is $0 but does say that patient may have to meet her deductible $226. Advised patient that she may get this bill but I could not say 100%. Patient verbalized understanding. Lab appointment made for 10/29/21 and Nurse Visit at [facility] location on 11/01/21

## 2021-10-24 NOTE — Addendum Note (Signed)
Addended by: Kris Mouton on: 10/24/2021 03:40 PM   Modules accepted: Orders

## 2021-10-29 ENCOUNTER — Other Ambulatory Visit: Payer: Self-pay

## 2021-10-29 ENCOUNTER — Other Ambulatory Visit (INDEPENDENT_AMBULATORY_CARE_PROVIDER_SITE_OTHER): Payer: Medicare Other

## 2021-10-29 DIAGNOSIS — M810 Age-related osteoporosis without current pathological fracture: Secondary | ICD-10-CM | POA: Diagnosis not present

## 2021-10-29 LAB — BASIC METABOLIC PANEL
BUN: 22 mg/dL (ref 6–23)
CO2: 30 mEq/L (ref 19–32)
Calcium: 9.5 mg/dL (ref 8.4–10.5)
Chloride: 104 mEq/L (ref 96–112)
Creatinine, Ser: 0.62 mg/dL (ref 0.40–1.20)
GFR: 83.47 mL/min (ref >60.00)
Glucose, Bld: 91 mg/dL (ref 70–99)
Potassium: 4.1 mEq/L (ref 3.5–5.1)
Sodium: 141 mEq/L (ref 135–145)

## 2021-10-31 NOTE — Telephone Encounter (Signed)
Lab done- Calcium normal at 9.5. CrCl is  53.03 mL/min

## 2021-11-01 ENCOUNTER — Other Ambulatory Visit: Payer: Self-pay

## 2021-11-01 ENCOUNTER — Ambulatory Visit (INDEPENDENT_AMBULATORY_CARE_PROVIDER_SITE_OTHER): Payer: Medicare Other

## 2021-11-01 DIAGNOSIS — M810 Age-related osteoporosis without current pathological fracture: Secondary | ICD-10-CM

## 2021-11-01 MED ORDER — DENOSUMAB 60 MG/ML ~~LOC~~ SOSY
60.0000 mg | PREFILLED_SYRINGE | Freq: Once | SUBCUTANEOUS | Status: AC
  Administered 2021-11-01: 60 mg via SUBCUTANEOUS

## 2021-11-01 NOTE — Progress Notes (Signed)
Per orders of Dr. Tower, injection of Prolia given by Keyosha Tiedt P Yuka Lallier, CMA in Right Arm.  Patient tolerated injection well.  

## 2021-12-06 DIAGNOSIS — Z20822 Contact with and (suspected) exposure to covid-19: Secondary | ICD-10-CM | POA: Diagnosis not present

## 2021-12-20 DIAGNOSIS — H401132 Primary open-angle glaucoma, bilateral, moderate stage: Secondary | ICD-10-CM | POA: Diagnosis not present

## 2021-12-27 DIAGNOSIS — H401132 Primary open-angle glaucoma, bilateral, moderate stage: Secondary | ICD-10-CM | POA: Diagnosis not present

## 2022-04-15 NOTE — Telephone Encounter (Signed)
Patient calling to schedule her next Prolia, states her card says 8.23.23 that she should schedule  Please follow up with the patient at her home or cell number on file, patient states it is okay to leave a message on the home phone unless

## 2022-04-19 ENCOUNTER — Ambulatory Visit (INDEPENDENT_AMBULATORY_CARE_PROVIDER_SITE_OTHER): Payer: Medicare Other

## 2022-04-19 VITALS — Ht 66.0 in | Wt 104.0 lb

## 2022-04-19 DIAGNOSIS — Z0000 Encounter for general adult medical examination without abnormal findings: Secondary | ICD-10-CM | POA: Diagnosis not present

## 2022-04-19 NOTE — Progress Notes (Signed)
Subjective:   Kathy [name] is a 81 y.o. female who presents for Medicare Annual (Subsequent) preventive examination.  Review of Systems    Virtual Visit via Telephone Note  I connected with  Rob Hickman on 04/19/22 at  3:45 PM EDT by telephone and verified that I am speaking with the correct person using two identifiers.  Location: Patient: Home Provider: Office Persons participating in the virtual visit: patient/Nurse Health Advisor   I discussed the limitations, risks, security and privacy concerns of performing an evaluation and management service by telephone and the availability of in person appointments. The patient expressed understanding and agreed to proceed.  Interactive audio and video telecommunications were attempted between this nurse and patient, however failed, due to patient having technical difficulties OR patient did not have access to video capability.  We continued and completed visit with audio only.  Some vital signs may be absent or patient reported.   Criselda Peaches, LPN  Cardiac Risk Factors include: advanced age (>42mn, >>65women)     Objective:    Today's Vitals   04/19/22 1548  Weight: 104 lb (47.2 kg)  Height: '5\' 6"'$  (1.676 m)   Body mass index is 16.79 kg/m.     04/19/2022    3:56 PM 04/18/2021   11:18 AM 07/05/2019    9:49 AM 06/26/2018   10:10 AM 06/18/2017    8:43 AM  Advanced Directives  Does Patient Have a Medical Advance Directive? Yes Yes Yes Yes Yes  Type of AParamed[place]of [place] will [place] will [place]Living will Living will;Healthcare Power of [place]restview HillsLiving will  Does patient want to make changes to medical advance directive? No - Patient declined   No - Patient declined   Copy of [place] Chart? Yes - validated most recent copy scanned in chart (See row information) Yes - validated  most recent copy scanned in chart (See row information) Yes - validated most recent copy scanned in chart (See row information) No - copy requested No - copy requested    Current Medications (verified) Outpatient Encounter Medications as of 04/19/2022  Medication Sig   ALPRAZolam (XANAX) 0.5 MG tablet Take by mouth.   aspirin 81 MG tablet Take 81 mg by mouth daily.   bimatoprost (LUMIGAN) 0.03 % ophthalmic solution Place 1 drop into both eyes at bedtime.   Brinzolamide-Brimonidine (SIMBRINZA OP) Place 1 drop into both eyes 2 (two) times daily.   Calcium Carbonate-Vitamin D (CALCIUM-VITAMIN D3) 600-125 MG-UNIT TABS Take by mouth.   cholecalciferol (VITAMIN D) 1000 units tablet Take 5,000 Units by mouth daily.   cyanocobalamin 1000 MCG tablet Take 100 mcg by mouth daily.   dorzolamide-timolol (COSOPT) 22.3-6.8 MG/ML ophthalmic solution Place 22.3 mLs into both eyes 2 (two) times daily.   fish oil-omega-3 fatty acids 1000 MG capsule Take 1 g by mouth daily.   Multiple Vitamin (MULTIVITAMIN) capsule Take 1 capsule by mouth daily.   Vitamin D, Ergocalciferol, (DRISDOL) 50000 units CAPS capsule Take 50,000 Units by mouth every 7 (seven) days.   No facility-administered encounter medications on file as of 04/19/2022.    Allergies (verified) Patient has no known allergies.   History: Past Medical History:  Diagnosis Date   [place]cer [place]    Hx of breast CA   Glaucoma    Hyperlipidemia    Osteoporosis    Past Surgical History:  Procedure Laterality Date  BREAST SURGERY  04/06/2001   mastectomy - left side   DILATION AND CURETTAGE OF UTERUS     EYE SURGERY  2010   cataract - left   MASTECTOMY  03/2001   left   TONSILLECTOMY     Family History  Problem Relation Age of Onset   Heart disease Mother        CHF   Hypertension Mother    Stroke Father    Hypertension Father    Osteoporosis Father    Heart disease Father        CHF   Breast cancer Cousin    Colon cancer Neg Hx     Social History   Socioeconomic History   Marital status: Married    Spouse name: Not on file   Number of children: Not on file   Years of education: Not on file   Highest education level: Not on file  Occupational History   Not on file  Tobacco Use   Smoking status: Never   Smokeless tobacco: Never  Vaping Use   Vaping Use: Never used  Substance and Sexual Activity   Alcohol use: No    Alcohol/week: 0.0 standard drinks of alcohol   Drug use: No   Sexual activity: Not on file  Other Topics Concern   Not on file  Social History Narrative   Not on file   Social Determinants of Health   Financial Resource Strain: Low Risk  (04/19/2022)   Overall Financial Resource Strain (CARDIA)    Difficulty of Paying Living Expenses: Not hard at all  Food Insecurity: No Food Insecurity (04/19/2022)   Hunger Vital Sign    Worried About Running Out of Food in the Last Year: Never true    Ran Out of Food in the Last Year: Never true  Transportation Needs: No Transportation Needs (04/19/2022)   PRAPARE - Hydrologist (Medical): No    Lack of Transportation (Non-Medical): No  Physical Activity: Sufficiently Active (04/19/2022)   Exercise Vital Sign    Days of Exercise per Week: 7 days    Minutes of Exercise per Session: 40 min  Stress: No Stress Concern Present (04/19/2022)   [place]    Feeling of Stress : Not at all  Social Connections: [place] (04/19/2022)   Social Connection and Isolation Panel [NHANES]    Frequency of Communication with Friends and Family: More than three times a week    Frequency of Social Gatherings with Friends and Family: More than three times a week    Attends Religious Services: More than 4 times per year    Active Member of [employer] or Organizations: Yes    Attends Music therapist: More than 4 times per year    Marital Status: Married     Tobacco Counseling Counseling given: Not Answered   Clinical Intake:  Pre-visit preparation completed: No Diabetic?  No   Activities of Daily Living    04/19/2022    3:54 PM  In your present state of health, do you have any difficulty performing the following activities:  Hearing? 1  Comment Wears hearing aids  Vision? 0  Difficulty concentrating or making decisions? 1  Walking or climbing stairs? 0  Dressing or bathing? 0  Doing errands, shopping? 0  Preparing Food and eating ? N  Using the Toilet? N  In the past six months, have you accidently leaked urine? N  Do  you have problems with loss of bowel control? N  Managing your Medications? N  Managing your Finances? N  Housekeeping or managing your Housekeeping? N    Patient Care Team: Tower, Wynelle Fanny, MD as PCP - General Leandrew Koyanagi, MD as Referring Physician (Ophthalmology)  Indicate any recent Medical Services you may have received from other than [facility] providers in the past year (date may be approximate).     Assessment:   This is a routine wellness examination for [place].  Hearing/Vision screen Hearing Screening - Comments:: Wears hearing aids Vision Screening - Comments:: Wears reading glasses.Followed by [place] issues and exercise activities discussed: Exercise limited by: None identified   Goals Addressed               This Visit's Progress     No current goals (pt-stated)         Depression Screen    04/19/2022    3:52 PM 04/18/2021   11:19 AM 07/05/2019    9:49 AM 06/26/2018   10:10 AM 06/18/2017    8:43 AM 06/13/2015    9:39 AM 05/30/2014    2:41 PM  PHQ 2/9 Scores  PHQ - 2 Score 0 0 0 0 0 0 0  PHQ- 9 Score  0 0 0 0      Fall Risk    04/19/2022    3:55 PM 04/18/2021   11:18 AM 07/05/2019    9:49 AM 06/26/2018   10:10 AM 06/18/2017    8:43 AM  Fall Risk   Falls in the past year? 0 0 1 No No  Number falls in past yr: 0 0 0    Injury with Fall? 0 0 0     Risk for fall due to : No Fall Risks No Fall Risks Impaired balance/gait    Follow up  Falls evaluation completed;Falls prevention discussed Falls evaluation completed;Falls prevention discussed      FALL RISK PREVENTION PERTAINING TO THE HOME:  Any stairs in or around the home? Yes  If so, are there any without handrails? No Home free of loose throw rugs in walkways, pet beds, electrical cords, etc? Yes  Adequate lighting in your home to reduce risk of falls? Yes   ASSISTIVE DEVICES UTILIZED TO PREVENT FALLS:  Life alert? No  Use of a cane, walker or w/c? No  Grab bars in the bathroom? Yes  Shower chair or bench in shower? Yes  Elevated toilet seat or a handicapped toilet? Yes   TIMED UP AND GO:  Was the test performed? No . Audio Visit  Cognitive Function:    04/18/2021   11:23 AM 07/05/2019    9:53 AM 06/26/2018    2:09 PM 06/18/2017    8:43 AM  MMSE - Mini Mental State Exam  Orientation to time '5 5 5 5  '$ Orientation to Place '5 5 5 5  '$ Registration '3 3 3 3  '$ Attention/ Calculation 5 5 0 0  Recall '3 3 3 3  '$ Language- name 2 objects   0 0  Language- repeat '1 1 1 1  '$ Language- follow 3 step command   3 3  Language- read & follow direction   0 0  Write a sentence   0 0  Copy design   0 0  Total score   20 20        04/19/2022    3:56 PM  6CIT Screen  What Year? 0 points  What month? 0  points  What time? 0 points  Count back from 20 0 points  Months in reverse 0 points  Repeat phrase 0 points  Total Score 0 points    Immunizations Immunization History  Administered Date(s) Administered   Fluad Quad(high Dose 65+) 06/09/2019   Influenza, High Dose Seasonal PF 06/04/2017, 06/18/2018, 06/02/2020   Influenza,inj,Quad PF,6+ Mos 05/30/2014, 06/13/2015, 06/17/2016   Influenza-Unspecified 06/17/2013   PFIZER(Purple Top)SARS-COV-2 Vaccination 10/05/2019, 10/26/2019   Pneumococcal Conjugate-13 05/30/2014   Pneumococcal Polysaccharide-23 05/09/2008   Td 02/25/2003    Zoster Recombinat (Shingrix) 01/14/2017, 07/17/2017   Zoster, Live 04/17/2006    TDAP status: Up to date  Flu Vaccine status: Up to date  Pneumococcal vaccine status: Up to date  Covid-19 vaccine status: Completed vaccines  Qualifies for Shingles Vaccine? Yes   Zostavax completed Yes   Shingrix Completed?: Yes  Screening Tests Health Maintenance  Topic Date Due   COVID-19 Vaccine (3 - Pfizer risk series) 05/05/2022 (Originally 11/23/2019)   INFLUENZA VACCINE  04/30/2022   MAMMOGRAM  07/19/2022   TETANUS/TDAP  06/04/2023   Pneumonia Vaccine 31+ Years old  Completed   DEXA SCAN  Completed   Zoster Vaccines- Shingrix  Completed   HPV VACCINES  Aged Out    Health Maintenance  There are no preventive care reminders to display for this patient.   Colorectal cancer screening: No longer required.   Mammogram status: No longer required due to Age.  Bone Density status: Completed 07/25/21. Results reflect: Bone density results: OSTEOPOROSIS. Repeat every   years.  Lung Cancer Screening: (Low Dose CT Chest recommended if Age 66-80 years, 30 pack-year currently smoking OR have quit w/in 15years.) does not qualify.     Additional Screening:  Hepatitis C Screening: does not qualify; Completed   Vision Screening: Recommended annual ophthalmology exams for early detection of glaucoma and other disorders of the eye. Is the patient up to date with their annual eye exam?  Yes  Who is the provider or what is the name of the office in which the patient attends annual eye exams? [facility] If pt is not established with a provider, would they like to be referred to a provider to establish care? No .   Dental Screening: Recommended annual dental exams for proper oral hygiene  Community Resource Referral / Chronic Care Management: CRR required this visit?  No   CCM required this visit?  No      Plan:     I have personally reviewed and noted the following in the patient's  chart:   Medical and social history Use of alcohol, tobacco or illicit drugs  Current medications and supplements including opioid prescriptions.  Functional ability and status Nutritional status Physical activity Advanced directives List of other physicians Hospitalizations, surgeries, and ER visits in previous 12 months Vitals Screenings to include cognitive, depression, and falls Referrals and appointments  In addition, I have reviewed and discussed with patient certain preventive protocols, quality metrics, and best practice recommendations. A written personalized care plan for preventive services as well as general preventive health recommendations were provided to patient.     Criselda Peaches, LPN   10/02/7251   Nurse Notes: None

## 2022-04-19 NOTE — Patient Instructions (Addendum)
Kathy [name] , Thank you for taking time to come for your Medicare Wellness Visit. I appreciate your ongoing commitment to your health goals. Please review the following plan we discussed and let me know if I can assist you in the future.   These are the goals we discussed:  Goals       Increase physical activity      Starting 06/26/2018, I will continue to walk on treadmill for 30 min 3 days and to walk 2-3 miles daily as weather permits.       No current goals (pt-stated)      Patient Stated      07/05/2019, Patient wants to continue exercising daily.       Patient Stated      04/18/2021, I will continue to walk daily for 2- 2 1/2 miles.        This is a list of the screening recommended for you and due dates:  Health Maintenance  Topic Date Due   COVID-19 Vaccine (3 - Pfizer risk series) 05/05/2022*   Flu Shot  04/30/2022   Mammogram  07/19/2022   Tetanus Vaccine  06/04/2023   Pneumonia Vaccine  Completed   DEXA scan (bone density measurement)  Completed   Zoster (Shingles) Vaccine  Completed   HPV Vaccine  Aged Out  *Topic was postponed. The date shown is not the original due date.    Advanced directives: Yes  Conditions/risks identified: None   Next appointment: Follow up in one year for your annual wellness visit     Preventive Care 65 Years and Older, Female Preventive care refers to lifestyle choices and visits with your health care provider that can promote health and wellness. What does preventive care include? A yearly physical exam. This is also called an annual well check. Dental exams once or twice a year. Routine eye exams. Ask your health care provider how often you should have your eyes checked. Personal lifestyle choices, including: Daily care of your teeth and gums. Regular physical activity. Eating a healthy diet. Avoiding tobacco and drug use. Limiting alcohol use. Practicing safe sex. Taking low-dose aspirin every day. Taking vitamin and  mineral supplements as recommended by your health care provider. What happens during an annual well check? The services and screenings done by your health care provider during your annual well check will depend on your age, overall health, lifestyle risk factors, and family history of disease. Counseling  Your health care provider may ask you questions about your: Alcohol use. Tobacco use. Drug use. Emotional well-being. Home and relationship well-being. Sexual activity. Eating habits. History of falls. Memory and ability to understand (cognition). Work and work Statistician. Reproductive health. Screening  You may have the following tests or measurements: Height, weight, and BMI. Blood pressure. Lipid and cholesterol levels. These may be checked every 5 years, or more frequently if you are over 29 years old. Skin check. Lung cancer screening. You may have this screening every year starting at age 94 if you have a 30-pack-year history of smoking and currently smoke or have quit within the past 15 years. Fecal occult blood test (FOBT) of the stool. You may have this test every year starting at age 33. Flexible sigmoidoscopy or colonoscopy. You may have a sigmoidoscopy every 5 years or a colonoscopy every 10 years starting at age 77. Hepatitis C blood test. Hepatitis B blood test. Sexually transmitted disease (STD) testing. Diabetes screening. This is done by checking your blood sugar (glucose) after you  have not eaten for a while (fasting). You may have this done every 1-3 years. Bone density scan. This is done to screen for osteoporosis. You may have this done starting at age 75. Mammogram. This may be done every 1-2 years. Talk to your health care provider about how often you should have regular mammograms. Talk with your health care provider about your test results, treatment options, and if necessary, the need for more tests. Vaccines  Your health care provider may recommend  certain vaccines, such as: Influenza vaccine. This is recommended every year. Tetanus, diphtheria, and acellular pertussis (Tdap, Td) vaccine. You may need a Td booster every 10 years. Zoster vaccine. You may need this after age 33. Pneumococcal 13-valent conjugate (PCV13) vaccine. One dose is recommended after age 60. Pneumococcal polysaccharide (PPSV23) vaccine. One dose is recommended after age 55. Talk to your health care provider about which screenings and vaccines you need and how often you need them. This information is not intended to replace advice given to you by your health care provider. Make sure you discuss any questions you have with your health care provider. Document Released: 10/13/2015 Document Revised: 06/05/2016 Document Reviewed: 07/18/2015 Elsevier Interactive Patient Education  2017 [place] Prevention in the Home Falls can cause injuries. They can happen to people of all ages. There are many things you can do to make your home safe and to help prevent falls. What can I do on the outside of my home? Regularly fix the edges of walkways and driveways and fix any cracks. Remove anything that might make you trip as you walk through a door, such as a raised step or threshold. Trim any bushes or trees on the path to your home. Use bright outdoor lighting. Clear any walking paths of anything that might make someone trip, such as rocks or tools. Regularly check to see if handrails are loose or broken. Make sure that both sides of any steps have handrails. Any raised decks and porches should have guardrails on the edges. Have any leaves, snow, or ice cleared regularly. Use sand or salt on walking paths during winter. Clean up any spills in your garage right away. This includes oil or grease spills. What can I do in the bathroom? Use night lights. Install grab bars by the toilet and in the tub and shower. Do not use towel bars as grab bars. Use non-skid mats or  decals in the tub or shower. If you need to sit down in the shower, use a plastic, non-slip stool. Keep the floor dry. Clean up any water that spills on the floor as soon as it happens. Remove soap buildup in the tub or shower regularly. Attach bath mats securely with double-sided non-slip rug tape. Do not have throw rugs and other things on the floor that can make you trip. What can I do in the bedroom? Use night lights. Make sure that you have a light by your bed that is easy to reach. Do not use any sheets or blankets that are too big for your bed. They should not hang down onto the floor. Have a firm chair that has side arms. You can use this for support while you get dressed. Do not have throw rugs and other things on the floor that can make you trip. What can I do in the kitchen? Clean up any spills right away. Avoid walking on wet floors. Keep items that you use a lot in easy-to-reach places. If you need  to reach something above you, use a strong step stool that has a grab bar. Keep electrical cords out of the way. Do not use floor polish or wax that makes floors slippery. If you must use wax, use non-skid floor wax. Do not have throw rugs and other things on the floor that can make you trip. What can I do with my stairs? Do not leave any items on the stairs. Make sure that there are handrails on both sides of the stairs and use them. Fix handrails that are broken or loose. Make sure that handrails are as long as the stairways. Check any carpeting to make sure that it is firmly attached to the stairs. Fix any carpet that is loose or worn. Avoid having throw rugs at the top or bottom of the stairs. If you do have throw rugs, attach them to the floor with carpet tape. Make sure that you have a light switch at the top of the stairs and the bottom of the stairs. If you do not have them, ask someone to add them for you. What else can I do to help prevent falls? Wear shoes that: Do not  have high heels. Have rubber bottoms. Are comfortable and fit you well. Are closed at the toe. Do not wear sandals. If you use a stepladder: Make sure that it is fully opened. Do not climb a closed stepladder. Make sure that both sides of the stepladder are locked into place. Ask someone to hold it for you, if possible. Clearly mark and make sure that you can see: Any grab bars or handrails. First and last steps. Where the edge of each step is. Use tools that help you move around (mobility aids) if they are needed. These include: Canes. Walkers. Scooters. Crutches. Turn on the lights when you go into a dark area. Replace any light bulbs as soon as they burn out. Set up your furniture so you have a clear path. Avoid moving your furniture around. If any of your floors are uneven, fix them. If there are any pets around you, be aware of where they are. Review your medicines with your doctor. Some medicines can make you feel dizzy. This can increase your chance of falling. Ask your doctor what other things that you can do to help prevent falls. This information is not intended to replace advice given to you by your health care provider. Make sure you discuss any questions you have with your health care provider. Document Released: 07/13/2009 Document Revised: 02/22/2016 Document Reviewed: 10/21/2014 Elsevier Interactive Patient Education  2017 [employer].

## 2022-04-23 ENCOUNTER — Telehealth: Payer: Self-pay

## 2022-04-23 DIAGNOSIS — M810 Age-related osteoporosis without current pathological fracture: Secondary | ICD-10-CM

## 2022-04-23 NOTE — Telephone Encounter (Signed)
Benefits submitted-pending Next injection 05/02/22 or after

## 2022-05-08 NOTE — Telephone Encounter (Signed)
OOP cost is $0 Left message for patient to call back to schedule lab and NV

## 2022-05-10 NOTE — Telephone Encounter (Signed)
Spoke with patient and rescheduled Lab for 8/14 and NV 05/15/22

## 2022-05-10 NOTE — Addendum Note (Signed)
Addended by: Kris Mouton on: 05/10/2022 10:59 AM   Modules accepted: Orders

## 2022-05-10 NOTE — Telephone Encounter (Signed)
Left message to  call back, patient was scheduled for lab and NV on 05/22/22-need to r/s, can not be on the same day, and need to see if patient can come sooner I need to speak with patient personally. Thank you

## 2022-05-13 ENCOUNTER — Other Ambulatory Visit (INDEPENDENT_AMBULATORY_CARE_PROVIDER_SITE_OTHER): Payer: Medicare Other

## 2022-05-13 DIAGNOSIS — M810 Age-related osteoporosis without current pathological fracture: Secondary | ICD-10-CM

## 2022-05-13 LAB — BASIC METABOLIC PANEL
BUN: 18 mg/dL (ref 6–23)
CO2: 29 mEq/L (ref 19–32)
Calcium: 8.6 mg/dL (ref 8.4–10.5)
Chloride: 105 mEq/L (ref 96–112)
Creatinine, Ser: 0.57 mg/dL (ref 0.40–1.20)
GFR: 84.85 mL/min (ref >60.00)
Glucose, Bld: 71 mg/dL (ref 70–99)
Potassium: 3.7 mEq/L (ref 3.5–5.1)
Sodium: 142 mEq/L (ref 135–145)

## 2022-05-14 NOTE — Telephone Encounter (Signed)
Cr Cl is 56.7 mL/min Calcium normal at 8.6

## 2022-05-15 ENCOUNTER — Ambulatory Visit (INDEPENDENT_AMBULATORY_CARE_PROVIDER_SITE_OTHER): Payer: Medicare Other

## 2022-05-15 DIAGNOSIS — M810 Age-related osteoporosis without current pathological fracture: Secondary | ICD-10-CM

## 2022-05-15 MED ORDER — DENOSUMAB 60 MG/ML ~~LOC~~ SOSY
60.0000 mg | PREFILLED_SYRINGE | Freq: Once | SUBCUTANEOUS | Status: AC
  Administered 2022-05-15: 60 mg via SUBCUTANEOUS

## 2022-05-15 NOTE — Progress Notes (Signed)
Patient presented for 64-monthProlia injection SQ to right arm given by JSherrilee Gilles CMA. Patient tolerated injection well.

## 2022-05-19 ENCOUNTER — Telehealth: Payer: Self-pay | Admitting: Family Medicine

## 2022-05-19 DIAGNOSIS — D696 Thrombocytopenia, unspecified: Secondary | ICD-10-CM

## 2022-05-19 DIAGNOSIS — E559 Vitamin D deficiency, unspecified: Secondary | ICD-10-CM

## 2022-05-19 DIAGNOSIS — Z13220 Encounter for screening for lipoid disorders: Secondary | ICD-10-CM

## 2022-05-19 DIAGNOSIS — E785 Hyperlipidemia, unspecified: Secondary | ICD-10-CM

## 2022-05-19 DIAGNOSIS — M810 Age-related osteoporosis without current pathological fracture: Secondary | ICD-10-CM

## 2022-05-19 NOTE — Telephone Encounter (Signed)
-----   Message from Ellamae Sia sent at 05/09/2022  3:51 PM EDT ----- Regarding: Lab orders for Wednesday, 8.23.23 Lab orders , thanks

## 2022-05-22 ENCOUNTER — Ambulatory Visit: Payer: Medicare Other

## 2022-05-22 ENCOUNTER — Other Ambulatory Visit: Payer: Medicare Other

## 2022-06-06 ENCOUNTER — Other Ambulatory Visit: Payer: Self-pay | Admitting: Family Medicine

## 2022-06-06 DIAGNOSIS — Z1231 Encounter for screening mammogram for malignant neoplasm of breast: Secondary | ICD-10-CM

## 2022-06-08 DIAGNOSIS — Z23 Encounter for immunization: Secondary | ICD-10-CM | POA: Diagnosis not present

## 2022-06-14 DIAGNOSIS — H401132 Primary open-angle glaucoma, bilateral, moderate stage: Secondary | ICD-10-CM | POA: Diagnosis not present

## 2022-07-04 ENCOUNTER — Other Ambulatory Visit: Payer: Medicare Other

## 2022-07-04 DIAGNOSIS — Z23 Encounter for immunization: Secondary | ICD-10-CM | POA: Diagnosis not present

## 2022-07-11 ENCOUNTER — Encounter: Payer: Medicare Other | Admitting: Family Medicine

## 2022-07-11 ENCOUNTER — Other Ambulatory Visit (INDEPENDENT_AMBULATORY_CARE_PROVIDER_SITE_OTHER): Payer: Medicare Other

## 2022-07-11 DIAGNOSIS — E785 Hyperlipidemia, unspecified: Secondary | ICD-10-CM

## 2022-07-11 DIAGNOSIS — E559 Vitamin D deficiency, unspecified: Secondary | ICD-10-CM

## 2022-07-11 DIAGNOSIS — M810 Age-related osteoporosis without current pathological fracture: Secondary | ICD-10-CM | POA: Diagnosis not present

## 2022-07-11 DIAGNOSIS — D696 Thrombocytopenia, unspecified: Secondary | ICD-10-CM | POA: Diagnosis not present

## 2022-07-11 LAB — CBC WITH DIFFERENTIAL/PLATELET
Basophils Absolute: 0 10*3/uL (ref 0.0–0.1)
Basophils Relative: 1.1 % (ref 0.0–3.0)
Eosinophils Absolute: 0.1 10*3/uL (ref 0.0–0.7)
Eosinophils Relative: 3.4 % (ref 0.0–5.0)
HCT: 39.2 % (ref 36.0–46.0)
Hemoglobin: 13.3 g/dL (ref 12.0–15.0)
Lymphocytes Relative: 24.3 % (ref 12.0–46.0)
Lymphs Abs: 1.1 10*3/uL (ref 0.7–4.0)
MCHC: 33.8 g/dL (ref 30.0–36.0)
MCV: 94.2 fl (ref 78.0–100.0)
Monocytes Absolute: 0.4 10*3/uL (ref 0.1–1.0)
Monocytes Relative: 8.4 % (ref 3.0–12.0)
Neutro Abs: 2.8 10*3/uL (ref 1.4–7.7)
Neutrophils Relative %: 62.8 % (ref 43.0–77.0)
Platelets: 142 10*3/uL — ABNORMAL LOW (ref 150.0–400.0)
RBC: 4.16 Mil/uL (ref 3.87–5.11)
RDW: 13.7 % (ref 11.5–15.5)
WBC: 4.4 10*3/uL (ref 4.0–10.5)

## 2022-07-11 LAB — COMPREHENSIVE METABOLIC PANEL
ALT: 10 U/L (ref 0–35)
AST: 16 U/L (ref 0–37)
Albumin: 3.9 g/dL (ref 3.5–5.2)
Alkaline Phosphatase: 40 U/L (ref 39–117)
BUN: 21 mg/dL (ref 6–23)
CO2: 29 mEq/L (ref 19–32)
Calcium: 9 mg/dL (ref 8.4–10.5)
Chloride: 105 mEq/L (ref 96–112)
Creatinine, Ser: 0.55 mg/dL (ref 0.40–1.20)
GFR: 85.49 mL/min (ref >60.00)
Glucose, Bld: 78 mg/dL (ref 70–99)
Potassium: 3.8 mEq/L (ref 3.5–5.1)
Sodium: 140 mEq/L (ref 135–145)
Total Bilirubin: 0.6 mg/dL (ref 0.2–1.2)
Total Protein: 6 g/dL (ref 6.0–8.3)

## 2022-07-11 LAB — LIPID PANEL
Cholesterol: 187 mg/dL (ref 0–200)
HDL: 87.6 mg/dL (ref >39.00)
LDL Cholesterol: 85 mg/dL (ref 0–99)
NonHDL: 99.31
Total CHOL/HDL Ratio: 2
Triglycerides: 71 mg/dL (ref 0.0–149.0)
VLDL: 14.2 mg/dL (ref 0.0–40.0)

## 2022-07-11 LAB — VITAMIN D 25 HYDROXY (VIT D DEFICIENCY, FRACTURES): VITD: 57.21 ng/mL (ref 30.00–100.00)

## 2022-07-11 LAB — TSH: TSH: 1.44 u[IU]/mL (ref 0.35–5.50)

## 2022-07-15 DIAGNOSIS — H401132 Primary open-angle glaucoma, bilateral, moderate stage: Secondary | ICD-10-CM | POA: Diagnosis not present

## 2022-07-16 ENCOUNTER — Encounter: Payer: Self-pay | Admitting: Family Medicine

## 2022-07-16 ENCOUNTER — Ambulatory Visit (INDEPENDENT_AMBULATORY_CARE_PROVIDER_SITE_OTHER): Payer: Medicare Other | Admitting: Family Medicine

## 2022-07-16 VITALS — BP 132/70 | HR 72 | Temp 97.2°F | Ht 64.25 in | Wt 102.2 lb

## 2022-07-16 DIAGNOSIS — I8393 Asymptomatic varicose veins of bilateral lower extremities: Secondary | ICD-10-CM

## 2022-07-16 DIAGNOSIS — Z853 Personal history of malignant neoplasm of breast: Secondary | ICD-10-CM | POA: Diagnosis not present

## 2022-07-16 DIAGNOSIS — R636 Underweight: Secondary | ICD-10-CM

## 2022-07-16 DIAGNOSIS — Z1211 Encounter for screening for malignant neoplasm of colon: Secondary | ICD-10-CM

## 2022-07-16 DIAGNOSIS — E785 Hyperlipidemia, unspecified: Secondary | ICD-10-CM | POA: Diagnosis not present

## 2022-07-16 DIAGNOSIS — D696 Thrombocytopenia, unspecified: Secondary | ICD-10-CM

## 2022-07-16 DIAGNOSIS — E559 Vitamin D deficiency, unspecified: Secondary | ICD-10-CM | POA: Diagnosis not present

## 2022-07-16 DIAGNOSIS — M810 Age-related osteoporosis without current pathological fracture: Secondary | ICD-10-CM | POA: Diagnosis not present

## 2022-07-16 NOTE — Assessment & Plan Note (Signed)
Urged to wear support socks now that weather is cooler  Has had vein tx in the past

## 2022-07-16 NOTE — Progress Notes (Signed)
Subjective:    Patient ID: Kathy [name], female    DOB: 1941/07/22, 81 y.o.   MRN: [id number]  HPI Pt presents for annual f/u of chronic health problems   Wt Readings from Last 3 Encounters:  07/16/22 102 lb 4 oz (46.4 kg)  04/19/22 104 lb (47.2 kg)  07/09/21 104 lb (47.2 kg)   17.41 kg/m  Doing well  Feeling good overall  Good summer   Walks almost every day  Does not do any strength training ( has not been to the gym since covid)  Wants to start back now  Has some weights at home and band   Protein- lots of chicken  Nuts  PB  Not a lot of dairy , some milk in cereal  Some ice cream in the spring and summer  Some dried beans   Immunization History  Administered Date(s) Administered   Fluad Quad(high Dose 65+) 06/09/2019   Influenza, High Dose Seasonal PF 06/04/2017, 06/18/2018, 06/02/2020, 07/04/2022   Influenza,inj,Quad PF,6+ Mos 05/30/2014, 06/13/2015, 06/17/2016   Influenza-Unspecified 06/17/2013   PFIZER(Purple Top)SARS-COV-2 Vaccination 10/05/2019, 10/26/2019   PNEUMOCOCCAL CONJUGATE-20 07/04/2022   Pneumococcal Conjugate-13 05/30/2014   Pneumococcal Polysaccharide-23 05/09/2008   Td 02/25/2003   Zoster Recombinat (Shingrix) 01/14/2017, 07/17/2017   Zoster, Live 04/17/2006   Health Maintenance Due  Topic Date Due   COVID-19 Vaccine (3 - Pfizer risk series) 11/23/2019   Personal h/o breast cancer  Mammogram 06/2021 -is scheduled for 07/22/22 No lumps on self exam   Dexa 06/2021 OP- taking prolia , tolerates well  Falls: none  Fractures: none new  Supplements ca and D  D level of 57  Exercise : walking   Cologuard negative 2019  Is ready to stop screening   Hyperlipidemia  Lab Results  Component Value Date   CHOL 187 07/11/2022   CHOL 204 (H) 04/18/2021   CHOL 202 (H) 07/06/2019   Lab Results  Component Value Date   HDL 87.60 07/11/2022   HDL 89.30 04/18/2021   HDL 84.60 07/06/2019   Lab Results  Component Value Date    LDLCALC 85 07/11/2022   LDLCALC 98 04/18/2021   LDLCALC 100 (H) 07/06/2019   Lab Results  Component Value Date   TRIG 71.0 07/11/2022   TRIG 83.0 04/18/2021   TRIG 86.0 07/06/2019   Lab Results  Component Value Date   CHOLHDL 2 07/11/2022   CHOLHDL 2 04/18/2021   CHOLHDL 2 07/06/2019   Lab Results  Component Value Date   LDLDIRECT 107.3 05/24/2013   LDLDIRECT 99.8 05/19/2012   LDLDIRECT 101.6 05/17/2011    Lab Results  Component Value Date   TSH 1.44 07/11/2022   Lab Results  Component Value Date   CREATININE 0.55 07/11/2022   BUN 21 07/11/2022   NA 140 07/11/2022   K 3.8 07/11/2022   CL 105 07/11/2022   CO2 29 07/11/2022   Lab Results  Component Value Date   ALT 10 07/11/2022   AST 16 07/11/2022   ALKPHOS 40 07/11/2022   BILITOT 0.6 07/11/2022    Lab Results  Component Value Date   WBC 4.4 07/11/2022   HGB 13.3 07/11/2022   HCT 39.2 07/11/2022   MCV 94.2 07/11/2022   PLT 142.0 (L) 07/11/2022     Review of Systems  Constitutional:  Negative for activity change, appetite change, fatigue, fever and unexpected weight change.  HENT:  Negative for congestion, ear pain, rhinorrhea, sinus pressure and sore throat.   Eyes:  Negative  for pain, redness and visual disturbance.  Respiratory:  Negative for cough, shortness of breath and wheezing.   Cardiovascular:  Negative for chest pain and palpitations.  Gastrointestinal:  Negative for abdominal pain, blood in stool, constipation and diarrhea.  Endocrine: Negative for polydipsia and polyuria.  Genitourinary:  Negative for dysuria, frequency and urgency.  Musculoskeletal:  Negative for arthralgias, back pain and myalgias.  Skin:  Negative for pallor and rash.  Allergic/Immunologic: Negative for environmental allergies.  Neurological:  Negative for dizziness, syncope and headaches.  Hematological:  Negative for adenopathy. Does not bruise/bleed easily.  Psychiatric/Behavioral:  Negative for decreased  concentration and dysphoric mood. The patient is not nervous/anxious.        Objective:   Physical Exam Constitutional:      General: She is not in acute distress.    Appearance: Normal appearance. She is well-developed. She is not ill-appearing or diaphoretic.     Comments: Under weight   HENT:     Head: Normocephalic and atraumatic.     Right Ear: Tympanic membrane, ear canal and external ear normal.     Left Ear: Tympanic membrane, ear canal and external ear normal.     Nose: Nose normal. No congestion.     Mouth/Throat:     Mouth: Mucous membranes are moist.     Pharynx: Oropharynx is clear. No posterior oropharyngeal erythema.  Eyes:     General: No scleral icterus.    Extraocular Movements: Extraocular movements intact.     Conjunctiva/sclera: Conjunctivae normal.     Pupils: Pupils are equal, round, and reactive to light.  Neck:     Thyroid: No thyromegaly.     Vascular: No carotid bruit or JVD.  Cardiovascular:     Rate and Rhythm: Normal rate and regular rhythm.     Pulses: Normal pulses.     Heart sounds: Normal heart sounds.     No gallop.     Comments: LE varicosities are compressible  Pulmonary:     Effort: Pulmonary effort is normal. No respiratory distress.     Breath sounds: Normal breath sounds. No wheezing.     Comments: Good air exch Chest:     Chest wall: No tenderness.  Abdominal:     General: Bowel sounds are normal. There is no distension or abdominal bruit.     Palpations: Abdomen is soft. There is no mass.     Tenderness: There is no abdominal tenderness.     Hernia: No hernia is present.  Genitourinary:    Comments: Breast exam R: No mass, nodules, thickening, tenderness, bulging, retraction, inflamation, nipple discharge or skin changes noted.  No axillary or clavicular LA.      L mastectomy site is nl appearing w/o masses Musculoskeletal:        General: No tenderness. Normal range of motion.     Cervical back: Normal range of motion and  neck supple. No rigidity. No muscular tenderness.     Right lower leg: No edema.     Left lower leg: No edema.     Comments: Moderate kyphosis   Lymphadenopathy:     Cervical: No cervical adenopathy.  Skin:    General: Skin is warm and dry.     Coloration: Skin is not pale.     Findings: No erythema or rash.     Comments: Mildly tanned Solar lentigines diffusely   Neurological:     Mental Status: She is alert. Mental status is at baseline.  Cranial Nerves: No cranial nerve deficit.     Motor: No abnormal muscle tone.     Coordination: Coordination normal.     Gait: Gait normal.     Deep Tendon Reflexes: Reflexes are normal and symmetric. Reflexes normal.  Psychiatric:        Mood and Affect: Mood normal.        Cognition and Memory: Cognition and memory normal.           Assessment & Plan:   Problem List Items Addressed This Visit       Cardiovascular and Mediastinum   Varicose vein of leg    Urged to wear support socks now that weather is cooler  Has had vein tx in the past         Musculoskeletal and Integument   Osteoporosis - Primary    Doing well on prolia  dexa 10/22 No falls or new fx  On ca and D D level is therapeutic  Enc to add strength training to her regular walking      Relevant Medications   VITAMIN D PO     Hematopoietic and Hemostatic   Thrombocytopenia (HCC)    Stable pl ct of 142 No symptoms         Other   Colon cancer screening    Neg cologuard 2019  Declines further screening at her age       Hyperlipidemia    Disc goals for lipids and reasons to control them Rev last labs with pt Rev low sat fat diet in detail Well controlled with diet       Personal history of breast cancer    With L mastectomy  Doing well  Mammogram planned for next wk       Underweight    Strongly encouraged inc protein calories for wt gain as well as strength training as tolerated       Vitamin D deficiency    D level 57  Vitamin D  level is therapeutic with current supplementation Disc importance of this to bone and overall health

## 2022-07-16 NOTE — Assessment & Plan Note (Signed)
D level 57  Vitamin D level is therapeutic with current supplementation Disc importance of this to bone and overall health

## 2022-07-16 NOTE — Assessment & Plan Note (Signed)
Strongly encouraged inc protein calories for wt gain as well as strength training as tolerated

## 2022-07-16 NOTE — Assessment & Plan Note (Signed)
With L mastectomy  Doing well  Mammogram planned for next wk

## 2022-07-16 NOTE — Assessment & Plan Note (Signed)
Doing well on prolia  dexa 10/22 No falls or new fx  On ca and D D level is therapeutic  Enc to add strength training to her regular walking

## 2022-07-16 NOTE — Assessment & Plan Note (Signed)
Stable pl ct of 142 No symptoms

## 2022-07-16 NOTE — Patient Instructions (Addendum)
Start working with weight and/or bands - there are a lot of videos on line  Chair yoga is good  Keep walking but try to add some strength training   Get protein for every meal and snacks  Add some more protein calories to gain weight   Get your mammogram as planned   Wear your support socks   Labs look stable

## 2022-07-16 NOTE — Assessment & Plan Note (Signed)
Disc goals for lipids and reasons to control them Rev last labs with pt Rev low sat fat diet in detail Well controlled with diet

## 2022-07-16 NOTE — Assessment & Plan Note (Signed)
Neg cologuard 2019  Declines further screening at her age

## 2022-07-22 ENCOUNTER — Ambulatory Visit
Admission: RE | Admit: 2022-07-22 | Discharge: 2022-07-22 | Disposition: A | Discharge status: Home / Self Care | Payer: Medicare Other | Source: Ambulatory Visit | Attending: Family Medicine | Admitting: Family Medicine

## 2022-07-22 DIAGNOSIS — Z1231 Encounter for screening mammogram for malignant neoplasm of breast: Secondary | ICD-10-CM | POA: Diagnosis not present

## 2022-07-29 ENCOUNTER — Encounter (INDEPENDENT_AMBULATORY_CARE_PROVIDER_SITE_OTHER): Payer: Self-pay

## 2022-08-07 DIAGNOSIS — B351 Tinea unguium: Secondary | ICD-10-CM | POA: Diagnosis not present

## 2022-08-07 DIAGNOSIS — L600 Ingrowing nail: Secondary | ICD-10-CM | POA: Diagnosis not present

## 2022-08-07 DIAGNOSIS — L03032 Cellulitis of left toe: Secondary | ICD-10-CM | POA: Diagnosis not present

## 2022-11-06 ENCOUNTER — Telehealth: Payer: Self-pay

## 2022-11-06 NOTE — Telephone Encounter (Signed)
Please start new PA for Prolia inj.  Patient is due on or after 11/16/22 for next inj.  Thank you

## 2022-11-07 NOTE — Telephone Encounter (Signed)
Prolia VOB initiated via [email or web address] 

## 2022-11-08 NOTE — Telephone Encounter (Signed)
Pt ready for scheduling on or after 11/16/22  Out-of-pocket cost due at time of visit: $240 (Deductible)  Primary: Medicare Prolia co-insurance: 0% Admin fee co-insurance: 0%   Deductible: $0 met of $240 required  Secondary: C[employer] Med[employer] Prolia co-insurance:  Admin fee co-insurance:   Deductible: This plan will consider the Medicare Part B co-insurance but does not cover the Medicare Part B deductible  Prior Auth:  PA# Valid:   ** This summary of benefits is an estimation of the patient's out-of-pocket cost. Exact cost may vary based on individual plan coverage.

## 2022-11-12 NOTE — Telephone Encounter (Signed)
I left a vmail for patient to call me back to sch lab and Prolia appts.

## 2022-11-13 ENCOUNTER — Other Ambulatory Visit: Payer: Self-pay

## 2022-11-13 DIAGNOSIS — M810 Age-related osteoporosis without current pathological fracture: Secondary | ICD-10-CM

## 2022-11-14 ENCOUNTER — Other Ambulatory Visit (INDEPENDENT_AMBULATORY_CARE_PROVIDER_SITE_OTHER): Payer: Medicare Other

## 2022-11-14 DIAGNOSIS — M810 Age-related osteoporosis without current pathological fracture: Secondary | ICD-10-CM | POA: Diagnosis not present

## 2022-11-14 LAB — BASIC METABOLIC PANEL
BUN: 22 mg/dL (ref 6–23)
CO2: 30 mEq/L (ref 19–32)
Calcium: 9 mg/dL (ref 8.4–10.5)
Chloride: 105 mEq/L (ref 96–112)
Creatinine, Ser: 0.65 mg/dL (ref 0.40–1.20)
GFR: 81.92 mL/min (ref >60.00)
Glucose, Bld: 58 mg/dL — ABNORMAL LOW (ref 70–99)
Potassium: 3.6 mEq/L (ref 3.5–5.1)
Sodium: 141 mEq/L (ref 135–145)

## 2022-11-14 NOTE — Telephone Encounter (Signed)
Prolia labs from today are ok for Prolia inj.  Prolia inj sch on 11/19/22.  Patient aware of $240 due at time of injection.

## 2022-11-19 ENCOUNTER — Telehealth: Payer: Self-pay | Admitting: Family Medicine

## 2022-11-19 ENCOUNTER — Ambulatory Visit (INDEPENDENT_AMBULATORY_CARE_PROVIDER_SITE_OTHER): Payer: Medicare Other

## 2022-11-19 DIAGNOSIS — M810 Age-related osteoporosis without current pathological fracture: Secondary | ICD-10-CM | POA: Diagnosis not present

## 2022-11-19 DIAGNOSIS — Z853 Personal history of malignant neoplasm of breast: Secondary | ICD-10-CM

## 2022-11-19 MED ORDER — DENOSUMAB 60 MG/ML ~~LOC~~ SOSY
60.0000 mg | PREFILLED_SYRINGE | Freq: Once | SUBCUTANEOUS | Status: AC
  Administered 2022-11-19: 60 mg via SUBCUTANEOUS

## 2022-11-19 NOTE — Telephone Encounter (Signed)
Patient called and asked if Dr Glori Bickers could write her a prescription for 2 medical bras. Please advise. Call back is [phone]

## 2022-11-19 NOTE — Telephone Encounter (Signed)
Please print and I will sign  Order done

## 2022-11-19 NOTE — Progress Notes (Signed)
Per orders of Dr. Loura Pardon, injection of Prolia 60 mg [place] in right arm (pt said cannot receive shot in lt arm due to hx of CA in breast) given by Ozzie Hoyle. Patient tolerated injection well.

## 2022-11-20 NOTE — Telephone Encounter (Signed)
Patient wanted to see if it can be mailed to her home at  [address] [place] [address]

## 2022-11-20 NOTE — Telephone Encounter (Signed)
Left VM requesting pt to call the office back   **IF PT CALLS BACK PLEASE ASK HER IF SHE WANTS TO PICK UP ORDER OR DOES SHE HAVE A DME STORE SHE WANTS ME TO SEND ORDER TO (WOULD NEED TO KNOW THE NAME OF THE DME STORE)

## 2022-11-20 NOTE — Telephone Encounter (Signed)
Ordered mailed

## 2022-12-25 DIAGNOSIS — H906 Mixed conductive and sensorineural hearing loss, bilateral: Secondary | ICD-10-CM | POA: Diagnosis not present

## 2023-01-09 DIAGNOSIS — H401132 Primary open-angle glaucoma, bilateral, moderate stage: Secondary | ICD-10-CM | POA: Diagnosis not present

## 2023-01-16 DIAGNOSIS — H2511 Age-related nuclear cataract, right eye: Secondary | ICD-10-CM | POA: Diagnosis not present

## 2023-01-16 DIAGNOSIS — H401132 Primary open-angle glaucoma, bilateral, moderate stage: Secondary | ICD-10-CM | POA: Diagnosis not present

## 2023-01-21 ENCOUNTER — Other Ambulatory Visit: Payer: Self-pay

## 2023-01-21 ENCOUNTER — Encounter: Payer: Self-pay | Admitting: Ophthalmology

## 2023-01-22 DIAGNOSIS — H2511 Age-related nuclear cataract, right eye: Secondary | ICD-10-CM | POA: Diagnosis not present

## 2023-01-22 DIAGNOSIS — H401112 Primary open-angle glaucoma, right eye, moderate stage: Secondary | ICD-10-CM | POA: Diagnosis not present

## 2023-01-28 NOTE — Discharge Instructions (Signed)

## 2023-01-29 ENCOUNTER — Ambulatory Visit: Payer: Medicare Other | Admitting: Anesthesiology

## 2023-01-29 ENCOUNTER — Other Ambulatory Visit: Payer: Self-pay

## 2023-01-29 ENCOUNTER — Ambulatory Visit
Admission: RE | Admit: 2023-01-29 | Discharge: 2023-01-29 | Disposition: A | Discharge status: Home / Self Care | Payer: Medicare Other | Attending: Ophthalmology | Admitting: Ophthalmology

## 2023-01-29 ENCOUNTER — Encounter: Payer: Self-pay | Admitting: Ophthalmology

## 2023-01-29 ENCOUNTER — Encounter
Admission: RE | Disposition: A | Discharge status: Home / Self Care | Payer: Self-pay | Source: Home / Self Care | Attending: Ophthalmology

## 2023-01-29 DIAGNOSIS — H2512 Age-related nuclear cataract, left eye: Secondary | ICD-10-CM | POA: Insufficient documentation

## 2023-01-29 DIAGNOSIS — H401122 Primary open-angle glaucoma, left eye, moderate stage: Secondary | ICD-10-CM | POA: Insufficient documentation

## 2023-01-29 DIAGNOSIS — H401112 Primary open-angle glaucoma, right eye, moderate stage: Secondary | ICD-10-CM | POA: Diagnosis not present

## 2023-01-29 DIAGNOSIS — H2511 Age-related nuclear cataract, right eye: Secondary | ICD-10-CM | POA: Diagnosis not present

## 2023-01-29 DIAGNOSIS — E785 Hyperlipidemia, unspecified: Secondary | ICD-10-CM | POA: Diagnosis not present

## 2023-01-29 DIAGNOSIS — H269 Unspecified cataract: Secondary | ICD-10-CM | POA: Diagnosis not present

## 2023-01-29 HISTORY — DX: Unspecified hearing loss, unspecified ear: H91.90

## 2023-01-29 HISTORY — DX: Sensorineural hearing loss, bilateral: H90.3

## 2023-01-29 HISTORY — DX: Vitamin D deficiency, unspecified: E55.9

## 2023-01-29 HISTORY — PX: CATARACT EXTRACTION W/PHACO: SHX586

## 2023-01-29 HISTORY — DX: Unspecified glaucoma: H40.9

## 2023-01-29 SURGERY — PHACOEMULSIFICATION, CATARACT, WITH IOL INSERTION
Anesthesia: Monitor Anesthesia Care | Site: Eye | Laterality: Right

## 2023-01-29 MED ORDER — ARMC OPHTHALMIC DILATING DROPS
1.0000 | OPHTHALMIC | Status: DC | PRN
  Administered 2023-01-29 (×3): 1 via OPHTHALMIC

## 2023-01-29 MED ORDER — BRIMONIDINE TARTRATE-TIMOLOL 0.2-0.5 % OP SOLN: OPHTHALMIC | Status: DC | PRN

## 2023-01-29 MED ORDER — SIGHTPATH DOSE#1 BSS IO SOLN
INTRAOCULAR | Status: DC | PRN
  Administered 2023-01-29: 15 mL

## 2023-01-29 MED ORDER — FENTANYL CITRATE (PF) 100 MCG/2ML IJ SOLN
INTRAMUSCULAR | Status: DC | PRN
  Administered 2023-01-29: 50 ug via INTRAVENOUS

## 2023-01-29 MED ORDER — MIDAZOLAM HCL 2 MG/2ML IJ SOLN
INTRAMUSCULAR | Status: DC | PRN
  Administered 2023-01-29: 1 mg via INTRAVENOUS

## 2023-01-29 MED ORDER — SIGHTPATH DOSE#1 NA HYALUR & NA CHOND-NA HYALUR IO KIT
PACK | INTRAOCULAR | Status: DC | PRN
  Administered 2023-01-29: 1 via OPHTHALMIC

## 2023-01-29 MED ORDER — LACTATED RINGERS IV SOLN: INTRAVENOUS | Status: DC

## 2023-01-29 MED ORDER — SIGHTPATH DOSE#1 BSS IO SOLN
INTRAOCULAR | Status: DC | PRN
  Administered 2023-01-29: 71 mL via OPHTHALMIC

## 2023-01-29 MED ORDER — TETRACAINE HCL 0.5 % OP SOLN
1.0000 [drp] | OPHTHALMIC | Status: DC | PRN
  Administered 2023-01-29 (×3): 1 [drp] via OPHTHALMIC

## 2023-01-29 MED ORDER — SIGHTPATH DOSE#1 BSS IO SOLN
INTRAOCULAR | Status: DC | PRN
  Administered 2023-01-29: 1 mL via INTRAMUSCULAR

## 2023-01-29 MED ORDER — TRYPAN BLUE 0.06 % IO SOSY
PREFILLED_SYRINGE | INTRAOCULAR | Status: DC | PRN
  Administered 2023-01-29: .2 mL via INTRAOCULAR

## 2023-01-29 MED ORDER — CEFUROXIME OPHTHALMIC INJECTION 1 MG/0.1 ML
INJECTION | OPHTHALMIC | Status: DC | PRN
  Administered 2023-01-29: .1 mL via INTRACAMERAL

## 2023-01-29 SURGICAL SUPPLY — 14 items
BLADE DUAL KAHOOK SINGLE USE (BLADE) IMPLANT
CANNULA ANT/CHMB 27G (MISCELLANEOUS) IMPLANT
CANNULA ANT/CHMB 27GA (MISCELLANEOUS) ×1 IMPLANT
CATARACT SUITE SIGHTPATH (MISCELLANEOUS) ×1 IMPLANT
FEE CATARACT SUITE SIGHTPATH (MISCELLANEOUS) ×1 IMPLANT
GLOVE SRG 8 PF TXTR STRL LF DI (GLOVE) ×1 IMPLANT
GLOVE SURG ENC TEXT LTX SZ7.5 (GLOVE) ×1 IMPLANT
GLOVE SURG UNDER POLY LF SZ8 (GLOVE) ×1
ICLIP (OPHTHALMIC RELATED) IMPLANT
LENS CLAREON WAGON WHEEL 21.0 (Intraocular Lens) ×1 IMPLANT
LENS IOL CLRN WGN WHL 21.0 (Intraocular Lens) IMPLANT
NDL FILTER BLUNT 18X1 1/2 (NEEDLE) ×1 IMPLANT
NEEDLE FILTER BLUNT 18X1 1/2 (NEEDLE) ×1 IMPLANT
SYR 3ML LL SCALE MARK (SYRINGE) ×1 IMPLANT

## 2023-01-29 NOTE — Transfer of Care (Signed)
Immediate Anesthesia Transfer of Care Note  Patient: Kathy [name]  Procedure(s) Performed: CATARACT EXTRACTION PHACO AND INTRAOCULAR LENS PLACEMENT (IOC) RIGHT [place] DUAL BLADE  GONIOTOMY  VISION BLUE  11.28  01:00.0 (Right: Eye)  Patient Location: PACU  Anesthesia Type: MAC  Level of Consciousness: awake, alert  and patient cooperative  Airway and Oxygen Therapy: Patient Spontanous Breathing and Patient connected to supplemental oxygen  Post-op Assessment: Post-op Vital signs reviewed, Patient's Cardiovascular Status Stable, Respiratory Function Stable, Patent Airway and No signs of Nausea or vomiting  Post-op Vital Signs: Reviewed and stable  Complications: No notable events documented.

## 2023-01-29 NOTE — Op Note (Signed)
PREOPERATIVE DIAGNOSIS:  Nuclear sclerotic cataract left eye. 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 Item Serial No. Manufacturer Lot No. LRB No. Used Action  LENS [place]AREON WAGON WHEEL 21.0 - [id number] Intraocular Lens LENS [place] WAGON WHEEL 21.0 [id number] [employer]  Right 1 Implanted  [id number] 21.0  SURGEON:  Deirdre Evener, MD   ANESTHESIA:  Topical with tetracaine drops augmented with 1% preservative-free intracameral lidocaine.    COMPLICATIONS:  None.   DESCRIPTION OF PROCEDURE:  The patient was identified in the holding room and transported to the operating room and placed in the supine position under the operating microscope.  The left eye was identified as the operative eye and it was prepped and draped in the usual sterile ophthalmic fashion.   A 1 millimeter clear-corneal paracentesis was made at the 5:30 position.  0.5 ml of preservative-free 1% lidocaine was injected into the anterior chamber.  The anterior chamber was filled with Viscoat viscoelastic.  A 2.4 millimeter keratome was used to make a near-clear corneal incision at the 2:30 position. The microscope was adjusted and a gonioprism was used to visulaize the trabecular meshwork.  The [facility] Dual Blade was advanced across the anterior chamber under viscoelastic.  The blade was used to mark the trabecular meshwork at the 7:30 position.  The blade was placed two clock hours clockwise into the meshwork.  Proper postioning was  confirmed.  The blade ws passed counterclockwise through the meshwork to excise approximately two to three clock-hours of trabecular meshwork.   A curvilinear capsulorrhexis was made with a cystotome and capsulorrhexis forceps.  Balanced salt solution was used to hydrodissect and hydrodelineate the nucleus.   Phacoemulsification was then used in stop and chop fashion to remove the lens nucleus and epinucleus.  The remaining cortex was then removed using the irrigation and aspiration handpiece. Provisc was then placed into the capsular bag to distend it for lens placement.  A lens was then injected into the capsular bag.  The remaining viscoelastic was aspirated.   Wounds were hydrated with balanced salt solution.  The anterior chamber was inflated to a physiologic pressure with balanced salt solution.  No wound leaks were noted. Cefuroxime 0.1 ml of a 10mg /ml solution was injected into the anterior chamber for a dose of 1 mg of intracameral antibiotic at the completion of the case.  The patient was taken to the recovery room in stable condition without complications of anesthesia or surgery.

## 2023-01-29 NOTE — Anesthesia Postprocedure Evaluation (Signed)
Anesthesia Post Note  Patient: Katrece Roediger  Procedure(s) Performed: CATARACT EXTRACTION PHACO AND INTRAOCULAR LENS PLACEMENT (IOC) RIGHT [place] DUAL BLADE  GONIOTOMY  VISION BLUE  11.28  01:00.0 (Right: Eye)  Patient location during evaluation: PACU Anesthesia Type: MAC Level of consciousness: awake and alert Pain management: pain level controlled Vital Signs Assessment: post-procedure vital signs reviewed and stable Respiratory status: spontaneous breathing, nonlabored ventilation, respiratory function stable and patient connected to nasal cannula oxygen Cardiovascular status: stable and blood pressure returned to baseline Postop Assessment: no apparent nausea or vomiting Anesthetic complications: no   No notable events documented.   Last Vitals:  Vitals:   01/29/23 0938 01/29/23 0944  BP: (!) 172/75 (!) 163/78  Pulse: 64 62  Resp: 18 14  Temp: 36.6 C (!) 36.4 C  SpO2: 100% 100%    Last Pain:  Vitals:   01/29/23 0944  TempSrc:   PainSc: 0-No pain                 Ezariah Nace C Nehal Shives

## 2023-01-29 NOTE — H&P (Signed)
[facility]   Primary Care Physician:  Tower, Audrie Gallus, MD Ophthalmologist: Dr. Lockie Mola  Pre-Procedure History & Physical: HPI:  Kathy [name] is a 82 y.o. female here for ophthalmic surgery.   Past Medical History:  Diagnosis Date   Cancer (HCC)    Hx of breast CA   Glaucoma    HOH (hard of hearing)    wears hearing aids both ears   Hyperlipidemia    Osteoporosis    Vitamin D deficiency     Past Surgical History:  Procedure Laterality Date   BREAST SURGERY  04/06/2001   mastectomy - left side   DILATION AND CURETTAGE OF UTERUS     EYE SURGERY  2010   cataract - left   MASTECTOMY  03/2001   left   TONSILLECTOMY      Prior to Admission medications   Medication Sig Start Date End Date Taking? Authorizing Provider  aspirin 81 MG tablet Take 81 mg by mouth daily.   Yes [provider]  bimatoprost (LUMIGAN) 0.03 % ophthalmic solution Place 1 drop into both eyes at bedtime.   Yes [provider]  Brinzolamide-Brimonidine [facility] OP) Place 1 drop into both eyes 2 (two) times daily.   Yes [provider]  Calcium Carbonate-Vitamin D (CALCIUM-VITAMIN D3) 600-125 MG-UNIT TABS Take by mouth.   Yes [provider]  cholecalciferol (VITAMIN D) 1000 units tablet Take 5,000 Units by mouth daily.   Yes [provider]  cyanocobalamin 1000 MCG tablet Take 100 mcg by mouth daily.   Yes [provider]  dorzolamide-timolol (COSOPT) 22.3-6.8 MG/ML ophthalmic solution Place 22.3 mLs into both eyes 2 (two) times daily. 10/13/14  Yes [provider]  fish oil-omega-3 fatty acids 1000 MG capsule Take 1 g by mouth daily.   Yes [provider]  Multiple Vitamin (MULTIVITAMIN) capsule Take 1 capsule by mouth daily.   Yes [provider]  VITAMIN D PO Take 1 tablet by mouth daily.   Yes [provider]    Allergies as of 01/20/2023   (No Known Allergies)    Family History   Problem Relation Age of Onset   Heart disease Mother        CHF   Hypertension Mother    Stroke Father    Hypertension Father    Osteoporosis Father    Heart disease Father        CHF   Breast cancer Cousin    Colon cancer Neg Hx     Social History   Socioeconomic History   Marital status: Married    Spouse name: Not on file   Number of children: Not on file   Years of education: Not on file   Highest education level: Not on file  Occupational History   Not on file  Tobacco Use   Smoking status: Never   Smokeless tobacco: Never  Vaping Use   Vaping Use: Never used  Substance and Sexual Activity   Alcohol use: No    Alcohol/week: 0.0 standard drinks of alcohol   Drug use: No   Sexual activity: Not on file  Other Topics Concern   Not on file  Social History Narrative   Not on file   Social Determinants of Health   Financial Resource Strain: Low Risk  (04/19/2022)   Overall Financial Resource Strain (CARDIA)    Difficulty of Paying Living Expenses: Not hard at all  Food Insecurity: No Food Insecurity (04/19/2022)   Hunger  Vital Sign    Worried About Programme researcher, broadcasting/film/video in the Last Year: Never true    Ran Out of Food in the Last Year: Never true  Transportation Needs: No Transportation Needs (04/19/2022)   PRAPARE - Administrator, Civil Service (Medical): No    Lack of Transportation (Non-Medical): No  Physical Activity: Sufficiently Active (04/19/2022)   Exercise Vital Sign    Days of Exercise per Week: 7 days    Minutes of Exercise per Session: 40 min  Stress: No Stress Concern Present (04/19/2022)   [employer] of Occupational Health - Occupational Stress Questionnaire    Feeling of Stress : Not at all  Social Connections: Socially Integrated (04/19/2022)   Social Connection and Isolation Panel [NHANES]    Frequency of Communication with Friends and Family: More than three times a week    Frequency of Social Gatherings with Friends and Family:  More than three times a week    Attends Religious Services: More than 4 times per year    Active Member of [employer] or Organizations: Yes    Attends Engineer, structural: More than 4 times per year    Marital Status: Married  Catering manager Violence: Not At Risk (04/19/2022)   Humiliation, Afraid, Rape, and Kick questionnaire    Fear of Current or Ex-Partner: No    Emotionally Abused: No    Physically Abused: No    Sexually Abused: No    Review of Systems: See HPI, otherwise negative ROS  Physical Exam: BP (!) 183/70   Pulse 69   Temp (!) 97 F (36.1 C) (Temporal)   Resp (!) 9   Ht 5\' 7"  (1.702 m)   Wt 46.7 kg   SpO2 99%   BMI 16.13 kg/m  General:   Alert,  pleasant and cooperative in NAD Head:  Normocephalic and atraumatic. Lungs:  Clear to auscultation.    Heart:  Regular rate and rhythm.   Impression/Plan: Kathy [name] is here for ophthalmic surgery.  Risks, benefits, limitations, and alternatives regarding ophthalmic surgery have been reviewed with the patient.  Questions have been answered.  All parties agreeable.   Lockie Mola, MD  01/29/2023, 8:51 AM

## 2023-01-29 NOTE — Anesthesia Preprocedure Evaluation (Addendum)
Anesthesia Evaluation  Patient identified by MRN, date of birth, ID band Patient awake    Reviewed: Allergy & Precautions, H&P , NPO status , Patient's Chart, lab work & pertinent test results  Airway Mallampati: III  TM Distance: <3 FB Neck ROM: Full  Mouth opening: Limited Mouth Opening  Dental no notable dental hx.    Pulmonary neg pulmonary ROS   Pulmonary exam normal breath sounds clear to auscultation       Cardiovascular negative cardio ROS Normal cardiovascular exam Rhythm:Regular Rate:Normal     Neuro/Psych negative neurological ROS  negative psych ROS   GI/Hepatic negative GI ROS, Neg liver ROS,,,  Endo/Other  negative endocrine ROS    Renal/GU negative Renal ROS  negative genitourinary   Musculoskeletal negative musculoskeletal ROS (+)    Abdominal   Peds negative pediatric ROS (+)  Hematology negative hematology ROS (+)   Anesthesia Other Findings Glaucoma Osteoporosis Bilateral sensorineural hearing loss  Reproductive/Obstetrics negative OB ROS                             Anesthesia Physical Anesthesia Plan  ASA: 2  Anesthesia Plan: MAC   Post-op Pain Management:    Induction: Intravenous  PONV Risk Score and Plan:   Airway Management Planned: Natural Airway and Nasal Cannula  Additional Equipment:   Intra-op Plan:   Post-operative Plan:   Informed Consent: I have reviewed the patients History and Physical, chart, labs and discussed the procedure including the risks, benefits and alternatives for the proposed anesthesia with the patient or authorized representative who has indicated his/her understanding and acceptance.     Dental Advisory Given  Plan Discussed with: Anesthesiologist, CRNA and Surgeon  Anesthesia Plan Comments: (Patient consented for risks of anesthesia including but not limited to:  - adverse reactions to medications - damage to eyes,  teeth, lips or other oral mucosa - nerve damage due to positioning  - sore throat or hoarseness - Damage to heart, brain, nerves, lungs, other parts of body or loss of life  Patient voiced understanding.)       Anesthesia Quick Evaluation

## 2023-02-25 DIAGNOSIS — Z961 Presence of intraocular lens: Secondary | ICD-10-CM | POA: Diagnosis not present

## 2023-04-23 ENCOUNTER — Ambulatory Visit (INDEPENDENT_AMBULATORY_CARE_PROVIDER_SITE_OTHER): Payer: Medicare Other

## 2023-04-23 VITALS — Ht 67.0 in | Wt 104.0 lb

## 2023-04-23 DIAGNOSIS — Z1231 Encounter for screening mammogram for malignant neoplasm of breast: Secondary | ICD-10-CM | POA: Diagnosis not present

## 2023-04-23 DIAGNOSIS — Z0000 Encounter for general adult medical examination without abnormal findings: Secondary | ICD-10-CM | POA: Diagnosis not present

## 2023-04-23 DIAGNOSIS — Z780 Asymptomatic menopausal state: Secondary | ICD-10-CM

## 2023-04-23 NOTE — Patient Instructions (Addendum)
Kathy [name] , Thank you for taking time to come for your Medicare Wellness Visit. I appreciate your ongoing commitment to your health goals. Please review the following plan we discussed and let me know if I can assist you in the future.   These are the goals we discussed:  Goals       Increase physical activity      Starting 06/26/2018, I will continue to walk on treadmill for 30 min 3 days and to walk 2-3 miles daily as weather permits.       No current goals (pt-stated)      Patient Stated      07/05/2019, Patient wants to continue exercising daily.       Patient Stated      04/18/2021, I will continue to walk daily for 2- 2 1/2 miles.      Patient Stated      Stay healthy and active        This is a list of the screening recommended for you and due dates:  Health Maintenance  Topic Date Due   DTaP/Tdap/Td vaccine (2 - Tdap) 02/24/2013   COVID-19 Vaccine (3 - 2023-24 season) 05/31/2022   Medicare Annual Wellness Visit  04/20/2023   Flu Shot  05/01/2023   Mammogram  07/23/2023   Pneumonia Vaccine  Completed   DEXA scan (bone density measurement)  Completed   Zoster (Shingles) Vaccine  Completed   HPV Vaccine  Aged Out   You have an order for:  []   2D Mammogram  [x]   3D Mammogram  [x]   Bone Density     Please call for appointment:  [facility] [address] [place], [place] [address] [phone]  [facility] [address] [place], [place] [address] [phone]  [facility] [address] [place], [place] [address] [phone]  [facility] [facility] [address] [place], [place] [address] [phone]  [facility] [address] [place], [place] [address] [phone]    Make sure to wear two-piece clothing.  No lotions, powders, or deodorants the day of the appointment. Make sure to bring picture ID and insurance card.   Bring list of medications you are currently taking including any supplements.   Schedule your [facility] screening mammogram through MyChart!   Log into your MyChart account.  Go to 'Visit' (or 'Appointments' if on mobile App) --> Schedule an Appointment  Under 'Select a Reason for Visit' choose the Mammogram Screening option.  Complete the pre-visit questions and select the time and place that best fits your schedule.    Advanced directives: Please bring a copy of your health care power of attorney and living will to the office to be added to your chart at your convenience.   Conditions/risks identified: none  Next appointment: Follow up in one year for your annual wellness visit 04/26/24 @ 3pm televisit   Preventive Care 65 Years and Older, Female Preventive care refers to lifestyle choices and visits with your health care provider that can promote health and wellness. What does preventive care include? A yearly physical exam. This is also called an annual well check. Dental exams once or twice a year. Routine eye exams. Ask your health care provider how often you should have your eyes checked. Personal lifestyle choices, including: Daily care of your teeth and gums. Regular physical activity. Eating a healthy diet. Avoiding tobacco and  drug use. Limiting alcohol use. Practicing safe sex. Taking low-dose aspirin every day. Taking vitamin and mineral supplements as recommended by your health care provider. What happens during an annual well check? The services and screenings done by your health care provider during your annual well check will depend on your age, overall health, lifestyle risk factors, and family history of disease. Counseling  Your health care provider may ask you questions about your: Alcohol use. Tobacco use. Drug use. Emotional well-being. Home and relationship well-being. Sexual activity. Eating habits. History of falls. Memory and ability to  understand (cognition). Work and work Astronomer. Reproductive health. Screening  You may have the following tests or measurements: Height, weight, and BMI. Blood pressure. Lipid and cholesterol levels. These may be checked every 5 years, or more frequently if you are over 56 years old. Skin check. Lung cancer screening. You may have this screening every year starting at age 7 if you have a 30-pack-year history of smoking and currently smoke or have quit within the past 15 years. Fecal occult blood test (FOBT) of the stool. You may have this test every year starting at age 65. Flexible sigmoidoscopy or colonoscopy. You may have a sigmoidoscopy every 5 years or a colonoscopy every 10 years starting at age 82. Hepatitis C blood test. Hepatitis B blood test. Sexually transmitted disease (STD) testing. Diabetes screening. This is done by checking your blood sugar (glucose) after you have not eaten for a while (fasting). You may have this done every 1-3 years. Bone density scan. This is done to screen for osteoporosis. You may have this done starting at age 36. Mammogram. This may be done every 1-2 years. Talk to your health care provider about how often you should have regular mammograms. Talk with your health care provider about your test results, treatment options, and if necessary, the need for more tests. Vaccines  Your health care provider may recommend certain vaccines, such as: Influenza vaccine. This is recommended every year. Tetanus, diphtheria, and acellular pertussis (Tdap, Td) vaccine. You may need a Td booster every 10 years. Zoster vaccine. You may need this after age 28. Pneumococcal 13-valent conjugate (PCV13) vaccine. One dose is recommended after age 70. Pneumococcal polysaccharide (PPSV23) vaccine. One dose is recommended after age 39. Talk to your health care provider about which screenings and vaccines you need and how often you need them. This information is not  intended to replace advice given to you by your health care provider. Make sure you discuss any questions you have with your health care provider. Document Released: 10/13/2015 Document Revised: 06/05/2016 Document Reviewed: 07/18/2015 Elsevier Interactive Patient Education  2017 [employer].  Fall Prevention in the Home Falls can cause injuries. They can happen to people of all ages. There are many things you can do to make your home safe and to help prevent falls. What can I do on the outside of my home? Regularly fix the edges of walkways and driveways and fix any cracks. Remove anything that might make you trip as you walk through a door, such as a raised step or threshold. Trim any bushes or trees on the path to your home. Use bright outdoor lighting. Clear any walking paths of anything that might make someone trip, such as rocks or tools. Regularly check to see if handrails are loose or broken. Make sure that both sides of any steps have handrails. Any raised decks and porches should have guardrails on the edges. Have any leaves, snow, or ice  cleared regularly. Use sand or salt on walking paths during winter. Clean up any spills in your garage right away. This includes oil or grease spills. What can I do in the bathroom? Use night lights. Install grab bars by the toilet and in the tub and shower. Do not use towel bars as grab bars. Use non-skid mats or decals in the tub or shower. If you need to sit down in the shower, use a plastic, non-slip stool. Keep the floor dry. Clean up any water that spills on the floor as soon as it happens. Remove soap buildup in the tub or shower regularly. Attach bath mats securely with double-sided non-slip rug tape. Do not have throw rugs and other things on the floor that can make you trip. What can I do in the bedroom? Use night lights. Make sure that you have a light by your bed that is easy to reach. Do not use any sheets or blankets that are  too big for your bed. They should not hang down onto the floor. Have a firm chair that has side arms. You can use this for support while you get dressed. Do not have throw rugs and other things on the floor that can make you trip. What can I do in the kitchen? Clean up any spills right away. Avoid walking on wet floors. Keep items that you use a lot in easy-to-reach places. If you need to reach something above you, use a strong step stool that has a grab bar. Keep electrical cords out of the way. Do not use floor polish or wax that makes floors slippery. If you must use wax, use non-skid floor wax. Do not have throw rugs and other things on the floor that can make you trip. What can I do with my stairs? Do not leave any items on the stairs. Make sure that there are handrails on both sides of the stairs and use them. Fix handrails that are broken or loose. Make sure that handrails are as long as the stairways. Check any carpeting to make sure that it is firmly attached to the stairs. Fix any carpet that is loose or worn. Avoid having throw rugs at the top or bottom of the stairs. If you do have throw rugs, attach them to the floor with carpet tape. Make sure that you have a light switch at the top of the stairs and the bottom of the stairs. If you do not have them, ask someone to add them for you. What else can I do to help prevent falls? Wear shoes that: Do not have high heels. Have rubber bottoms. Are comfortable and fit you well. Are closed at the toe. Do not wear sandals. If you use a stepladder: Make sure that it is fully opened. Do not climb a closed stepladder. Make sure that both sides of the stepladder are locked into place. Ask someone to hold it for you, if possible. Clearly mark and make sure that you can see: Any grab bars or handrails. First and last steps. Where the edge of each step is. Use tools that help you move around (mobility aids) if they are needed. These  include: Canes. Walkers. Scooters. Crutches. Turn on the lights when you go into a dark area. Replace any light bulbs as soon as they burn out. Set up your furniture so you have a clear path. Avoid moving your furniture around. If any of your floors are uneven, fix them. If there are any pets  around you, be aware of where they are. Review your medicines with your doctor. Some medicines can make you feel dizzy. This can increase your chance of falling. Ask your doctor what other things that you can do to help prevent falls. This information is not intended to replace advice given to you by your health care provider. Make sure you discuss any questions you have with your health care provider. Document Released: 07/13/2009 Document Revised: 02/22/2016 Document Reviewed: 10/21/2014 Elsevier Interactive Patient Education  2017 [employer].

## 2023-04-23 NOTE — Progress Notes (Signed)
Subjective:   Kathy [name] is a 82 y.o. female who presents for Medicare Annual (Subsequent) preventive examination.  Visit Complete: Virtual  I connected with  Mikki Santee on 04/23/23 by a audio enabled telemedicine application and verified that I am speaking with the correct person using two identifiers.  Patient Location: Home  Provider Location: Home Office  I discussed the limitations of evaluation and management by telemedicine. The patient expressed understanding and agreed to proceed.  Review of Systems     Cardiac Risk Factors include: advanced age (>3men, >67 women);dyslipidemia  Per patient no change in vitals since last visit; unable to obtain new vitals due to this being a telehealth visit.   Patient was unable to self-report vital signs via telehealth due to a lack of equipment at home.     Objective:    Today's Vitals   04/23/23 1047  Weight: 104 lb (47.2 kg)  Height: 5\' 7"  (1.702 m)   Body mass index is 16.29 kg/m.     04/23/2023   10:53 AM 01/29/2023    8:36 AM 04/19/2022    3:56 PM 04/18/2021   11:18 AM 07/05/2019    9:49 AM 06/26/2018   10:10 AM 06/18/2017    8:43 AM  Advanced Directives  Does Patient Have a Medical Advance Directive? Yes Yes Yes Yes Yes Yes Yes  Type of Estate agent of [place];Living will Healthcare Power of [place];Living will Healthcare Power of [place];Living will Healthcare Power of [place];Living will Healthcare Power of [place];Living will Living will;Healthcare Power of [employer] Power of [place];Living will  Does patient want to make changes to medical advance directive? No - Patient declined No - Patient declined No - Patient declined   No - Patient declined   Copy of Healthcare Power of Attorney in Chart? Yes - validated most recent copy scanned in chart (See row information) Yes - validated most recent copy scanned in chart (See row information) Yes - validated most recent copy  scanned in chart (See row information) Yes - validated most recent copy scanned in chart (See row information) Yes - validated most recent copy scanned in chart (See row information) No - copy requested No - copy requested    Current Medications (verified) Outpatient Encounter Medications as of 04/23/2023  Medication Sig   aspirin 81 MG tablet Take 81 mg by mouth daily.   Brinzolamide-Brimonidine (SIMBRINZA OP) Place 1 drop into both eyes 2 (two) times daily.   Calcium Carbonate-Vitamin D (CALCIUM-VITAMIN D3) 600-125 MG-UNIT TABS Take by mouth.   cholecalciferol (VITAMIN D) 1000 units tablet Take 5,000 Units by mouth daily.   cyanocobalamin 1000 MCG tablet Take 100 mcg by mouth daily.   dorzolamide-timolol (COSOPT) 22.3-6.8 MG/ML ophthalmic solution Place 22.3 mLs into both eyes 2 (two) times daily.   fish oil-omega-3 fatty acids 1000 MG capsule Take 1 g by mouth daily.   Multiple Vitamin (MULTIVITAMIN) capsule Take 1 capsule by mouth daily.   bimatoprost (LUMIGAN) 0.03 % ophthalmic solution Place 1 drop into both eyes at bedtime. (Patient not taking: Reported on 04/23/2023)   VITAMIN D PO Take 1 tablet by mouth daily. (Patient not taking: Reported on 04/23/2023)   No facility-administered encounter medications on file as of 04/23/2023.    Allergies (verified) Patient has no known allergies.   History: Past Medical History:  Diagnosis Date   Bilateral sensorineural hearing loss    Cancer (HCC)    Hx of breast CA   Glaucoma  Glaucoma (increased eye pressure)    HOH (hard of hearing)    wears hearing aids both ears   Hyperlipidemia    Osteoporosis    Vitamin D deficiency    Past Surgical History:  Procedure Laterality Date   BREAST SURGERY  04/06/2001   mastectomy - left side   CATARACT EXTRACTION W/PHACO Right 01/29/2023   Procedure: CATARACT EXTRACTION PHACO AND INTRAOCULAR LENS PLACEMENT (IOC) RIGHT KAHOOK DUAL BLADE  GONIOTOMY  VISION BLUE  11.28  01:00.0;  Surgeon: Lockie Mola, MD;  [facility];  Service: Ophthalmology;  Laterality: Right;   DILATION AND CURETTAGE OF UTERUS     EYE SURGERY  2010   cataract - left   MASTECTOMY  03/2001   left   TONSILLECTOMY     Family History  Problem Relation Age of Onset   Heart disease Mother        CHF   Hypertension Mother    Stroke Father    Hypertension Father    Osteoporosis Father    Heart disease Father        CHF   Breast cancer Cousin    Colon cancer Neg Hx    Social History   Socioeconomic History   Marital status: Married    Spouse name: Not on file   Number of children: Not on file   Years of education: Not on file   Highest education level: Not on file  Occupational History   Not on file  Tobacco Use   Smoking status: Never   Smokeless tobacco: Never  Vaping Use   Vaping status: Never Used  Substance and Sexual Activity   Alcohol use: No    Alcohol/week: 0.0 standard drinks of alcohol   Drug use: No   Sexual activity: Not on file  Other Topics Concern   Not on file  Social History Narrative   Not on file   Social Determinants of Health   Financial Resource Strain: Low Risk  (04/23/2023)   Overall Financial Resource Strain (CARDIA)    Difficulty of Paying Living Expenses: Not hard at all  Food Insecurity: No Food Insecurity (04/23/2023)   Hunger Vital Sign    Worried About Running Out of Food in the Last Year: Never true    Ran Out of Food in the Last Year: Never true  Transportation Needs: No Transportation Needs (04/23/2023)   PRAPARE - Administrator, Civil Service (Medical): No    Lack of Transportation (Non-Medical): No  Physical Activity: Sufficiently Active (04/23/2023)   Exercise Vital Sign    Days of Exercise per Week: 7 days    Minutes of Exercise per Session: 40 min  Stress: No Stress Concern Present (04/23/2023)   [employer] of Occupational Health - Occupational Stress Questionnaire    Feeling of Stress : Not at all  Social  Connections: Moderately Integrated (04/23/2023)   Social Connection and Isolation Panel [NHANES]    Frequency of Communication with Friends and Family: More than three times a week    Frequency of Social Gatherings with Friends and Family: More than three times a week    Attends Religious Services: More than 4 times per year    Active Member of [employer] or Organizations: No    Attends Banker Meetings: Never    Marital Status: Married    Tobacco Counseling Counseling given: Not Answered   Clinical Intake:  Pre-visit preparation completed: Yes  Pain : No/denies pain  BMI - recorded: 16.29 Nutritional Status: BMI <19  Underweight Nutritional Risks: None Diabetes: No  How often do you need to have someone help you when you read instructions, pamphlets, or other written materials from your doctor or pharmacy?: 1 - Never  Interpreter Needed?: No  Information entered by :: C.Manville Rico LPN   Activities of Daily Living    04/23/2023   10:54 AM 01/29/2023    8:38 AM  In your present state of health, do you have any difficulty performing the following activities:  Hearing? 1 0  Comment wears aids   Vision? 0 0  Difficulty concentrating or making decisions? 0 0  Walking or climbing stairs? 0 0  Dressing or bathing? 0 0  Doing errands, shopping? 0   Preparing Food and eating ? N   Using the Toilet? N   In the past six months, have you accidently leaked urine? N   Do you have problems with loss of bowel control? N   Managing your Medications? N   Managing your Finances? N   Housekeeping or managing your Housekeeping? N     Patient Care Team: Tower, Audrie Gallus, MD as PCP - General Lockie Mola, MD as Referring Physician (Ophthalmology)  Indicate any recent Medical Services you may have received from other than [facility] providers in the past year (date may be approximate).     Assessment:   This is a routine wellness examination for [place].  Hearing/Vision  screen Hearing Screening - Comments:: Wears aids Vision Screening - Comments:: Readers - [facility] - UTD on eye exams  Dietary issues and exercise activities discussed:     Goals Addressed             This Visit's Progress    Patient Stated       Stay healthy and active       Depression Screen    04/23/2023   10:53 AM 04/19/2022    3:52 PM 04/18/2021   11:19 AM 07/05/2019    9:49 AM 06/26/2018   10:10 AM 06/18/2017    8:43 AM 06/13/2015    9:39 AM  PHQ 2/9 Scores  PHQ - 2 Score 0 0 0 0 0 0 0  PHQ- 9 Score   0 0 0 0     Fall Risk    04/23/2023   10:54 AM 04/19/2022    3:55 PM 04/18/2021   11:18 AM 07/05/2019    9:49 AM 06/26/2018   10:10 AM  Fall Risk   Falls in the past year? 0 0 0 1 No  Number falls in past yr: 0 0 0 0   Injury with Fall? 0 0 0 0   Risk for fall due to : No Fall Risks No Fall Risks No Fall Risks Impaired balance/gait   Follow up Falls prevention discussed;Falls evaluation completed  Falls evaluation completed;Falls prevention discussed Falls evaluation completed;Falls prevention discussed     MEDICARE RISK AT HOME:  Medicare Risk at Home - 04/23/23 1055     Any stairs in or around the home? Yes    If so, are there any without handrails? No    Home free of loose throw rugs in walkways, pet beds, electrical cords, etc? Yes    Adequate lighting in your home to reduce risk of falls? Yes    Life alert? No    Use of a cane, walker or w/c? No    Grab bars in the bathroom? Yes    Shower chair  or bench in shower? Yes    Elevated toilet seat or a handicapped toilet? Yes             TIMED UP AND GO:  Was the test performed?  No    Cognitive Function:    04/18/2021   11:23 AM 07/05/2019    9:53 AM 06/26/2018    2:09 PM 06/18/2017    8:43 AM  MMSE - Mini Mental State Exam  Orientation to time 5 5 5 5   Orientation to Place 5 5 5 5   Registration 3 3 3 3   Attention/ Calculation 5 5 0 0  Recall 3 3 3 3   Language- name 2 objects   0 0   Language- repeat 1 1 1 1   Language- follow 3 step command   3 3  Language- read & follow direction   0 0  Write a sentence   0 0  Copy design   0 0  Total score   20 20        04/23/2023   10:56 AM 04/19/2022    3:56 PM  6CIT Screen  What Year? 0 points 0 points  What month? 0 points 0 points  What time? 0 points 0 points  Count back from 20 2 points 0 points  Months in reverse 4 points 0 points  Repeat phrase 4 points 0 points  Total Score 10 points 0 points    Immunizations Immunization History  Administered Date(s) Administered   Fluad Quad(high Dose 65+) 06/09/2019   Influenza, High Dose Seasonal PF 06/04/2017, 06/18/2018, 06/02/2020, 07/04/2022   Influenza,inj,Quad PF,6+ Mos 05/30/2014, 06/13/2015, 06/17/2016   Influenza-Unspecified 06/17/2013   PFIZER(Purple Top)SARS-COV-2 Vaccination 10/05/2019, 10/26/2019   PNEUMOCOCCAL CONJUGATE-20 07/04/2022   Pneumococcal Conjugate-13 05/30/2014   Pneumococcal Polysaccharide-23 05/09/2008   Td 02/25/2003   Zoster Recombinant(Shingrix) 01/14/2017, 07/17/2017   Zoster, Live 04/17/2006    TDAP status: Due, Education has been provided regarding the importance of this vaccine. Advised may receive this vaccine at local pharmacy or Health Dept. Aware to provide a copy of the vaccination record if obtained from local pharmacy or Health Dept. Verbalized acceptance and understanding.  Flu Vaccine status: Up to date  Pneumococcal vaccine status: Up to date  Covid-19 vaccine status: Information provided on how to obtain vaccines.   Qualifies for Shingles Vaccine? Yes   Zostavax completed Yes   Shingrix Completed?: Yes  Screening Tests Health Maintenance  Topic Date Due   DTaP/Tdap/Td (2 - Tdap) 02/24/2013   COVID-19 Vaccine (3 - 2023-24 season) 05/31/2022   Medicare Annual Wellness (AWV)  04/20/2023   INFLUENZA VACCINE  05/01/2023   MAMMOGRAM  07/23/2023   Pneumonia Vaccine 70+ Years old  Completed   DEXA SCAN  Completed    Zoster Vaccines- Shingrix  Completed   HPV VACCINES  Aged Out    Health Maintenance  Health Maintenance Due  Topic Date Due   DTaP/Tdap/Td (2 - Tdap) 02/24/2013   COVID-19 Vaccine (3 - 2023-24 season) 05/31/2022   Medicare Annual Wellness (AWV)  04/20/2023    Colorectal cancer screening: No longer required.   Mammogram status: Ordered 04/23/23. Pt provided with contact info and advised to call to schedule appt.   Bone Density status: Ordered 04/23/23. Pt provided with contact info and advised to call to schedule appt.  Lung Cancer Screening: (Low Dose CT Chest recommended if Age 64-80 years, 20 pack-year currently smoking OR have quit w/in 15years.) does not qualify.   Lung Cancer Screening Referral: n/a  Additional Screening:  Hepatitis C Screening: does not qualify; Completed n/a  Vision Screening: Recommended annual ophthalmology exams for early detection of glaucoma and other disorders of the eye. Is the patient up to date with their annual eye exam?  Yes  Who is the provider or what is the name of the office in which the patient attends annual eye exams? [facility] If pt is not established with a provider, would they like to be referred to a provider to establish care? Yes .   Dental Screening: Recommended annual dental exams for proper oral hygiene     Community Resource Referral / Chronic Care Management: CRR required this visit?  No   CCM required this visit?  No     Plan:     I have personally reviewed and noted the following in the patient's chart:   Medical and social history Use of alcohol, tobacco or illicit drugs  Current medications and supplements including opioid prescriptions. Patient is not currently taking opioid prescriptions. Functional ability and status Nutritional status Physical activity Advanced directives List of other physicians Hospitalizations, surgeries, and ER visits in previous 12 months Vitals Screenings to include  cognitive, depression, and falls Referrals and appointments  In addition, I have reviewed and discussed with patient certain preventive protocols, quality metrics, and best practice recommendations. A written personalized care plan for preventive services as well as general preventive health recommendations were provided to patient.     Maryan Puls, LPN   1/61/0960   After Visit Summary: (MyChart) Due to this being a telephonic visit, the after visit summary with patients personalized plan was offered to patient via MyChart   Nurse Notes: none

## 2023-06-03 ENCOUNTER — Other Ambulatory Visit: Payer: Self-pay | Admitting: Family Medicine

## 2023-06-03 DIAGNOSIS — Z1231 Encounter for screening mammogram for malignant neoplasm of breast: Secondary | ICD-10-CM

## 2023-06-05 DIAGNOSIS — H401132 Primary open-angle glaucoma, bilateral, moderate stage: Secondary | ICD-10-CM | POA: Diagnosis not present

## 2023-06-05 DIAGNOSIS — Z961 Presence of intraocular lens: Secondary | ICD-10-CM | POA: Diagnosis not present

## 2023-06-14 IMAGING — MG MM DIGITAL SCREENING UNILAT*R* W/ TOMO W/ CAD
4 series · 4 of 12 positions shown · non-contrast
Comparison: Previous exam(s).

CLINICAL DATA: Screening.

EXAM:
DIGITAL SCREENING UNILATERAL RIGHT MAMMOGRAM WITH CAD AND
TOMOSYNTHESIS
TECHNIQUE: Right screening digital craniocaudal and mediolateral oblique
mammograms were obtained. Right screening digital breast
tomosynthesis was performed. The images were evaluated with
computer-aided detection.

[R MLO synth-2D]
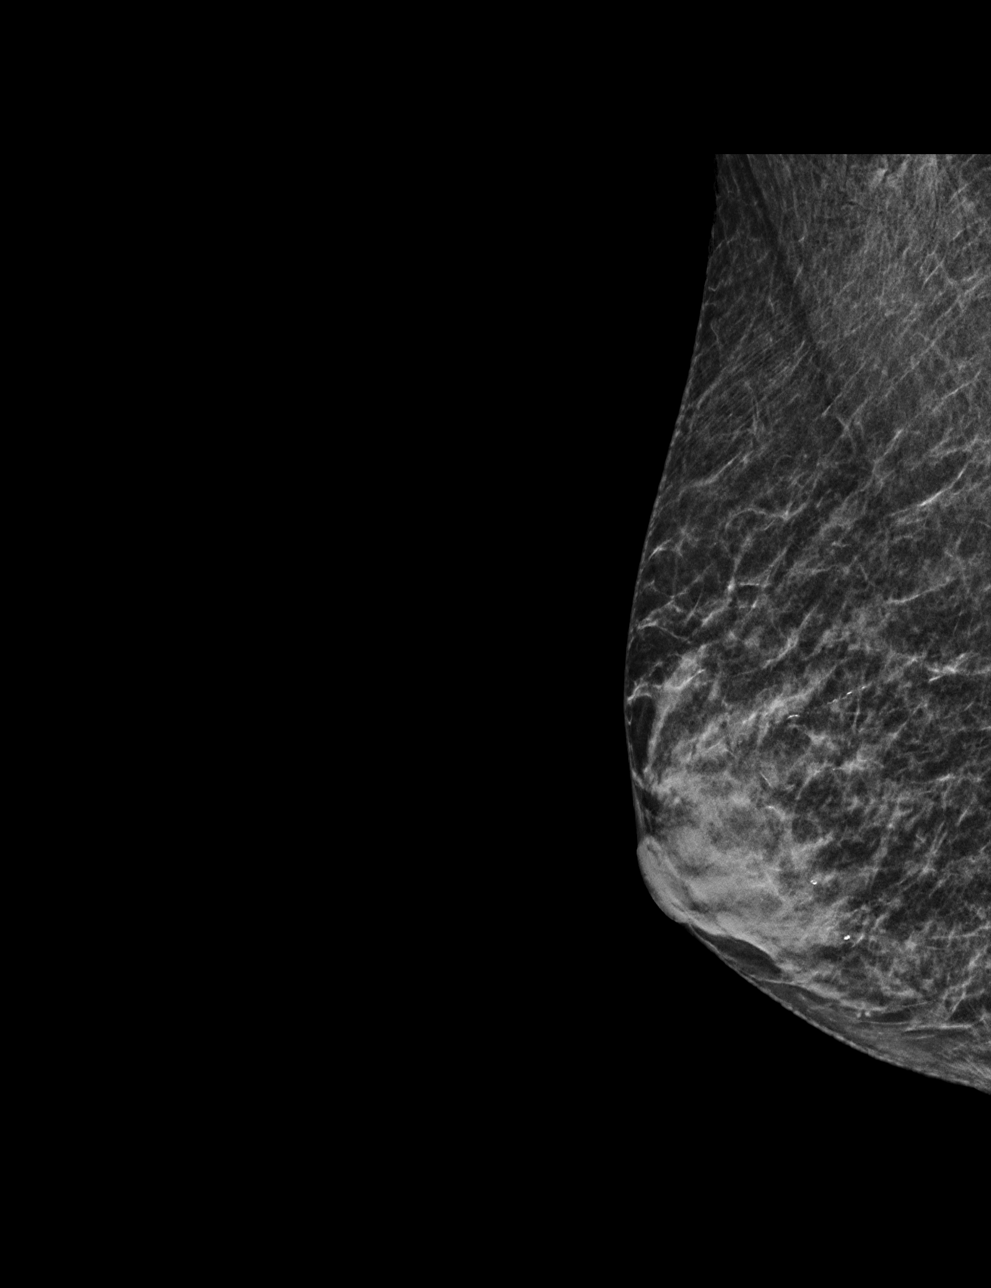

[R CC synth-2D]
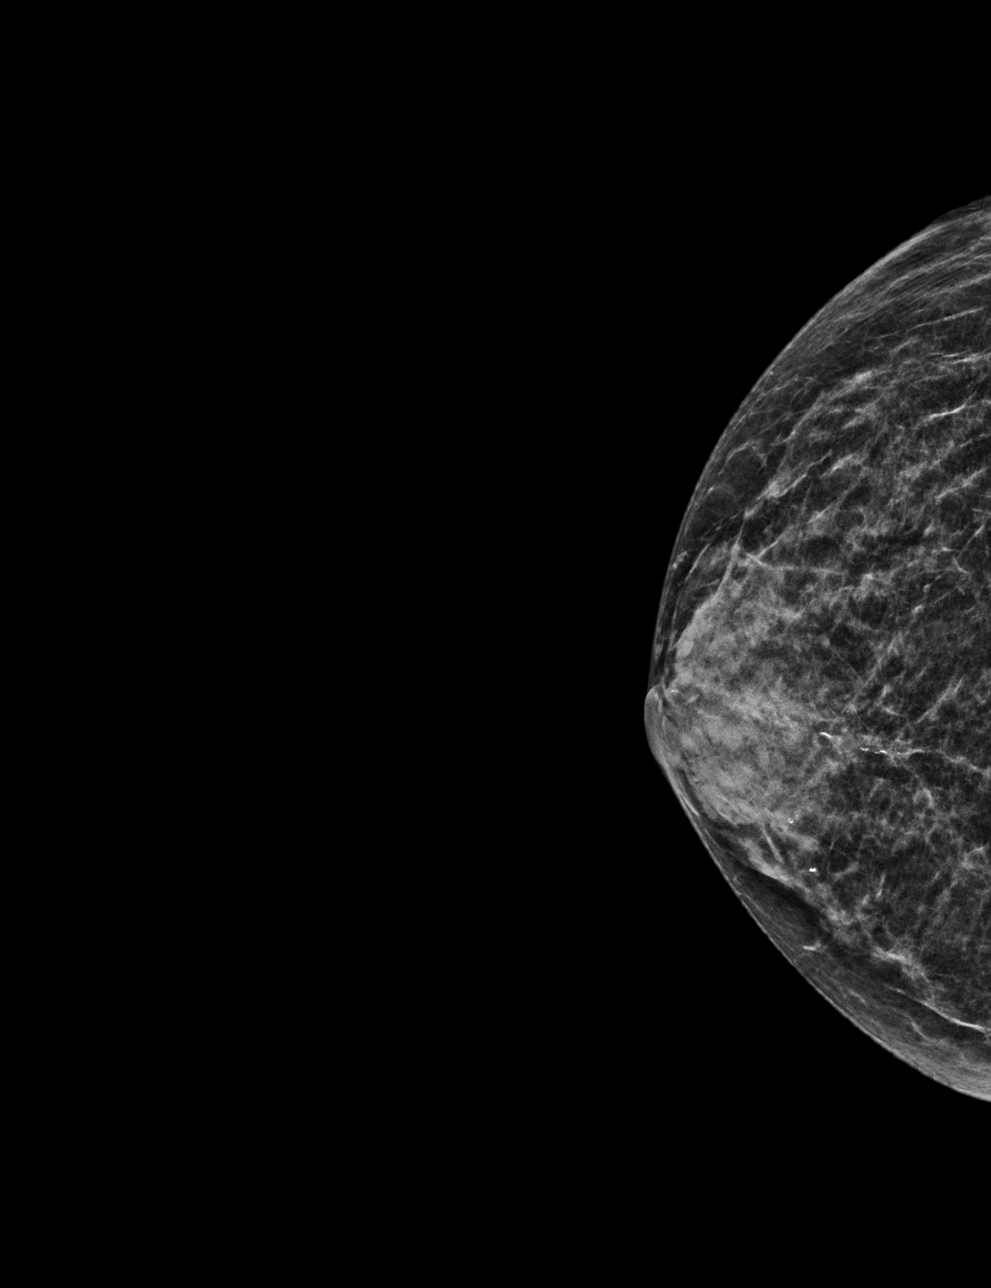

[R CC tomo · tomo slice 18/35.0]
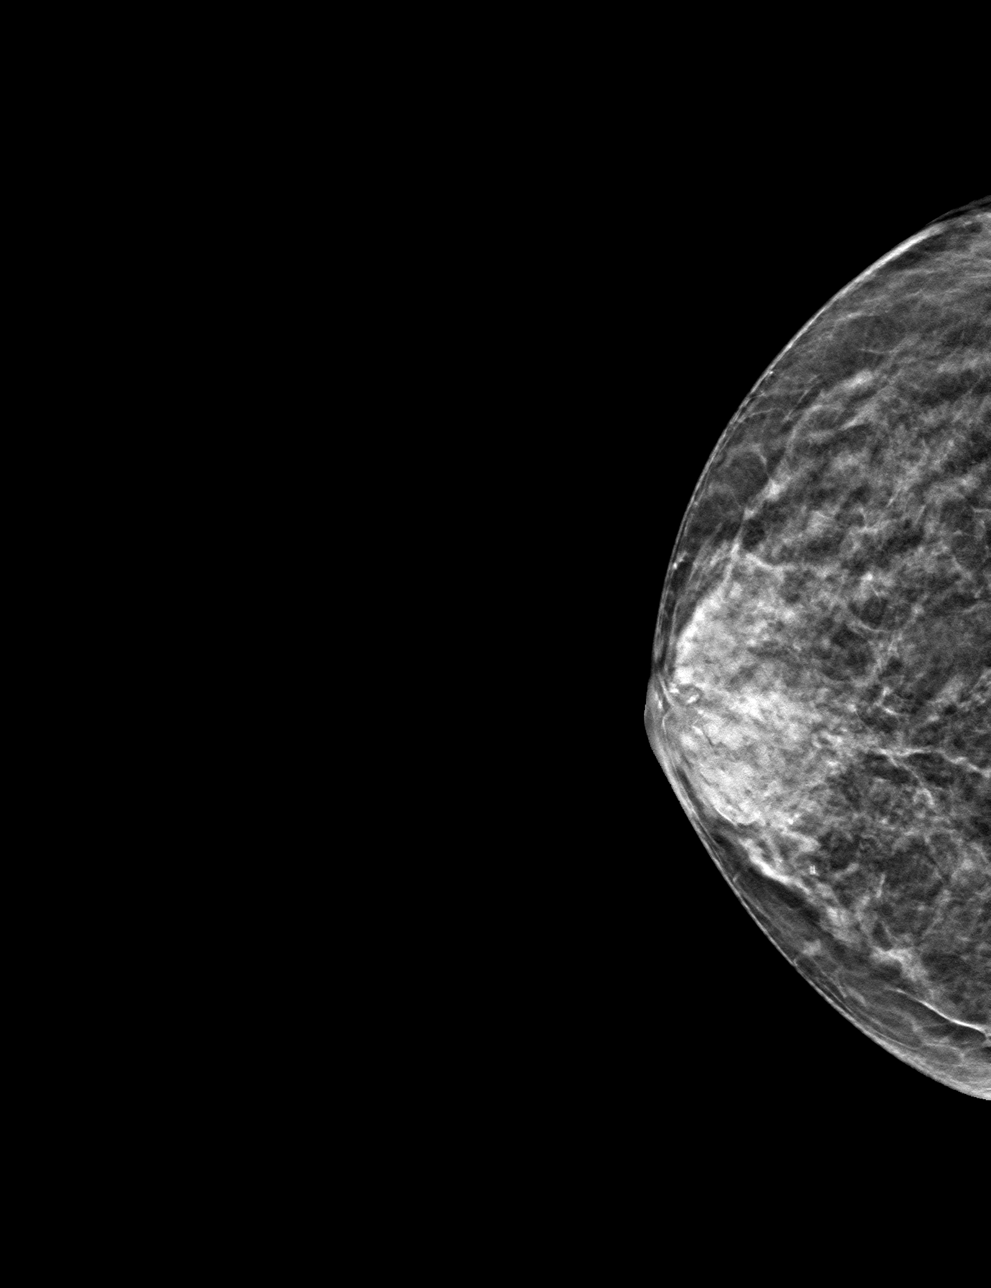

[R MLO tomo · tomo slice 19/38.0]
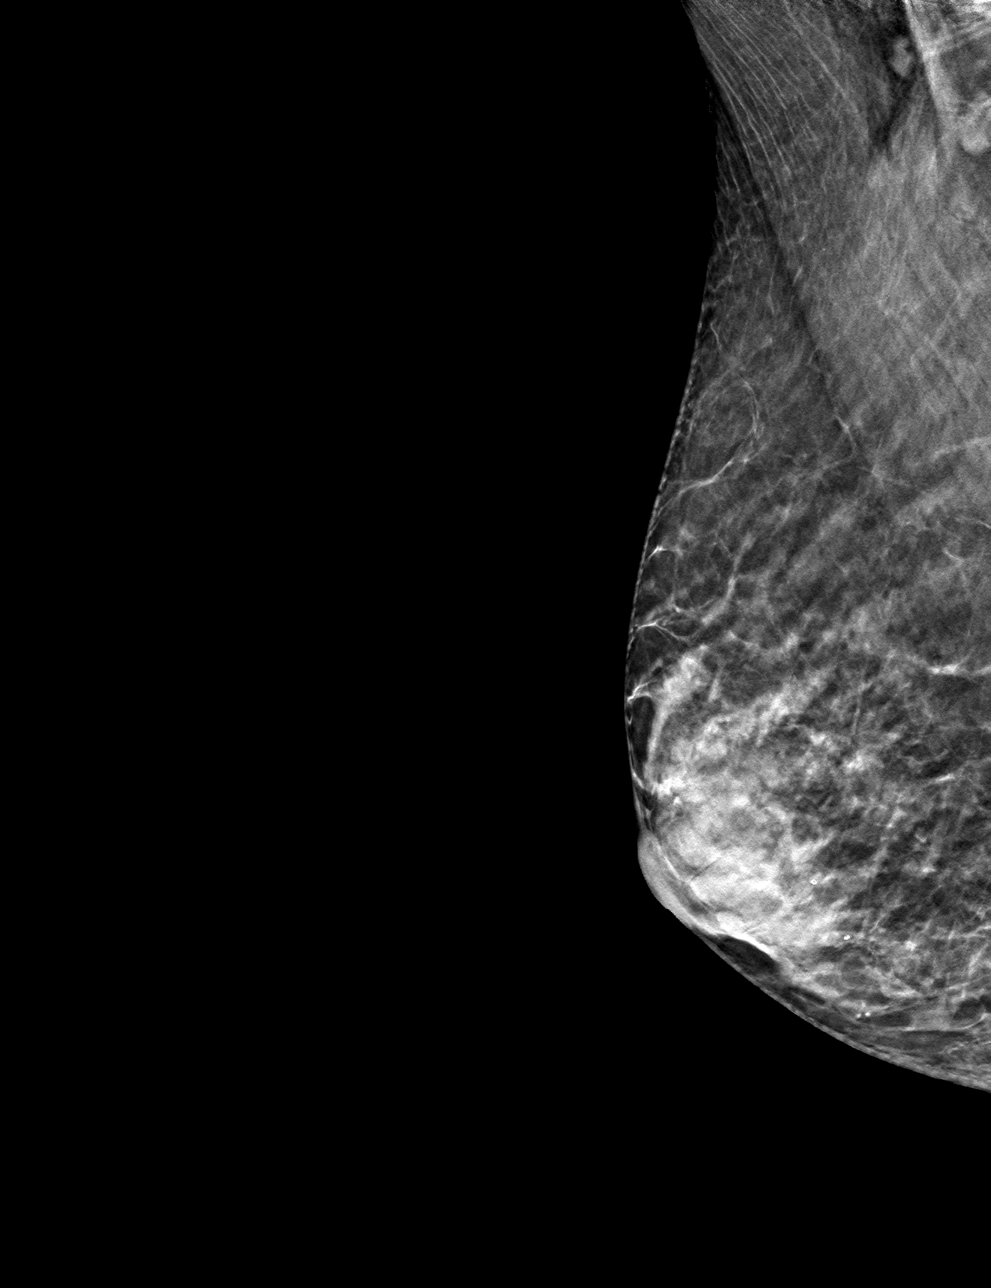

[4 of 12 positions shown; findings below may reference images not displayed]

ACR Breast Density Category c: The breast tissue is heterogeneously
dense, which may obscure small masses.
FINDINGS: There are no findings suspicious for malignancy.
IMPRESSION: No mammographic evidence of malignancy. A result letter of this
screening mammogram will be mailed directly to the patient.

RECOMMENDATION:
Screening mammogram in one year. (Code:[id number])

BI-RADS CATEGORY  1: Negative.

## 2023-06-18 DIAGNOSIS — Z23 Encounter for immunization: Secondary | ICD-10-CM | POA: Diagnosis not present

## 2023-07-11 ENCOUNTER — Other Ambulatory Visit: Payer: Medicare Other

## 2023-07-18 ENCOUNTER — Encounter: Payer: Medicare Other | Admitting: Family Medicine

## 2023-07-22 ENCOUNTER — Ambulatory Visit: Payer: Medicare Other | Admitting: Family Medicine

## 2023-07-22 ENCOUNTER — Ambulatory Visit (INDEPENDENT_AMBULATORY_CARE_PROVIDER_SITE_OTHER): Payer: Medicare Other | Admitting: Family Medicine

## 2023-07-22 ENCOUNTER — Ambulatory Visit (INDEPENDENT_AMBULATORY_CARE_PROVIDER_SITE_OTHER)
Admission: RE | Admit: 2023-07-22 | Discharge: 2023-07-22 | Disposition: A | Discharge status: Home / Self Care | Payer: Medicare Other | Source: Ambulatory Visit | Attending: Family Medicine | Admitting: Family Medicine

## 2023-07-22 VITALS — BP 126/76 | HR 67 | Temp 98.1°F | Ht 67.0 in | Wt 104.0 lb

## 2023-07-22 DIAGNOSIS — R109 Unspecified abdominal pain: Secondary | ICD-10-CM

## 2023-07-22 DIAGNOSIS — R10A2 Flank pain, left side: Secondary | ICD-10-CM | POA: Insufficient documentation

## 2023-07-22 DIAGNOSIS — R1032 Left lower quadrant pain: Secondary | ICD-10-CM | POA: Insufficient documentation

## 2023-07-22 DIAGNOSIS — R3129 Other microscopic hematuria: Secondary | ICD-10-CM | POA: Diagnosis not present

## 2023-07-22 LAB — POC URINALSYSI DIPSTICK (AUTOMATED)
Bilirubin, UA: 1
Blood, UA: 200 — AB
Glucose, UA: NEGATIVE
Ketones, UA: 5
Nitrite, UA: NEGATIVE
Protein, UA: NEGATIVE
Spec Grav, UA: 1.025 (ref 1.010–1.025)
Urobilinogen, UA: 0.2 U/dL
pH, UA: 6 (ref 5.0–8.0)

## 2023-07-22 LAB — POCT UA - MICROSCOPIC ONLY

## 2023-07-22 NOTE — Assessment & Plan Note (Signed)
No other symptoms Sharp and intermittent, started in flank  Reassuring exam-no tenderness  Notable micro hematuria   Ucx and labs ordered KUB Consider CT based on results

## 2023-07-22 NOTE — Progress Notes (Unsigned)
Subjective:    Patient ID: Kathy [name], female    DOB: Aug 02, 1941, 82 y.o.   MRN: [id number]  HPI  Wt Readings from Last 3 Encounters:  07/22/23 104 lb (47.2 kg)  04/23/23 104 lb (47.2 kg)  01/29/23 103 lb (46.7 kg)   16.29 kg/m  Vitals:   07/22/23 1545  BP: 126/76  Pulse: 67  Temp: 98.1 F (36.7 C)  SpO2: 98%    Pt presents for left flank pain that moved to LLQ Points to LLQ of abd now  Started last Friday  On and off  Sharp at times  Not tender to touch  Moving around does not make it better or worse  Not even every day   Woke her up at 3 am -took ibuprofen and put heating pad on it  Started hurting at 1 pm again today  No history of renal stone    Urinalysis  Positive for blood   Results for orders placed or performed in visit on 07/22/23  POCT Urinalysis Dipstick (Automated)  Result Value Ref Range   Color, UA Yellow    Clarity, UA Hazy    Glucose, UA Negative Negative   Bilirubin, UA 1 mg/dL    Ketones, UA 5 mg/dL    Spec Grav, UA 3.664 1.010 - 1.025   Blood, UA 200 Ery/uL (A)    pH, UA 6.0 5.0 - 8.0   Protein, UA Negative Negative   Urobilinogen, UA 0.2 0.2 or 1.0 E.U./dL   Nitrite, UA Negative    Leukocytes, UA Small (1+) (A) Negative  POCT UA - Microscopic Only  Result Value Ref Range   WBC, Ur, HPF, POC 0-2 0 - 5   RBC, Urine, Miroscopic many 0 - 2   Bacteria, U Microscopic few None - Trace   Mucus, UA few    Epithelial cells, urine per micros few    Crystals, Ur, HPF, POC none    Casts, Ur, LPF, POC none    Yeast, UA none     No urinary symptoms  No blood in urine   Colonoscopy 2014 Normal  No tics noted   No change in bowel habits No blood in stool        Patient Active Problem List   Diagnosis Date Noted   Left flank pain 07/22/2023   Left lower quadrant abdominal pain 07/22/2023   Microscopic hematuria 07/22/2023   Thrombocytopenia (HCC) 04/17/2021   Varicose veins of leg with pain, right 03/23/2021    Hyperlipidemia 02/27/2021   Right arm pain 07/13/2019   Hearing loss 06/29/2018   Colon cancer screening 06/29/2018   Underweight 06/17/2016   Varicose vein of leg 06/17/2016   Vitamin D deficiency 06/17/2016   Estrogen deficiency 06/13/2015   Routine general medical examination at a health care facility 06/05/2015   Encounter for Medicare annual wellness exam 05/23/2013   INTERMITTENT VERTIGO 06/26/2010   Disequilibrium 06/26/2010   H/O compression fracture of spine 05/10/2009   Pathologic fracture of vertebrae 05/10/2009   Screening for lipoid disorders 04/01/2007   OVERACTIVE BLADDER 04/01/2007   Osteoporosis 04/01/2007   Personal history of breast cancer 04/01/2007   Past Medical History:  Diagnosis Date   Bilateral sensorineural hearing loss    Cancer (HCC)    Hx of breast CA   Glaucoma    Glaucoma (increased eye pressure)    HOH (hard of hearing)    wears hearing aids both ears   Hyperlipidemia    Osteoporosis  Vitamin D deficiency    Past Surgical History:  Procedure Laterality Date   BREAST SURGERY  04/06/2001   mastectomy - left side   CATARACT EXTRACTION W/PHACO Right 01/29/2023   Procedure: CATARACT EXTRACTION PHACO AND INTRAOCULAR LENS PLACEMENT (IOC) RIGHT KAHOOK DUAL BLADE  GONIOTOMY  VISION BLUE  11.28  01:00.0;  Surgeon: Lockie Mola, MD;  Location: [facility];  Service: Ophthalmology;  Laterality: Right;   DILATION AND CURETTAGE OF UTERUS     EYE SURGERY  2010   cataract - left   MASTECTOMY  03/2001   left   TONSILLECTOMY     Social History   Tobacco Use   Smoking status: Never   Smokeless tobacco: Never  Vaping Use   Vaping status: Never Used  Substance Use Topics   Alcohol use: No    Alcohol/week: 0.0 standard drinks of alcohol   Drug use: No   Family History  Problem Relation Age of Onset   Heart disease Mother        CHF   Hypertension Mother    Stroke Father    Hypertension Father    Osteoporosis Father    Heart  disease Father        CHF   Breast cancer Cousin    Colon cancer Neg Hx    No Known Allergies Current Outpatient Medications on File Prior to Visit  Medication Sig Dispense Refill   aspirin 81 MG tablet Take 81 mg by mouth daily.     Calcium Carbonate-Vitamin D (CALCIUM-VITAMIN D3) 600-125 MG-UNIT TABS Take by mouth.     cholecalciferol (VITAMIN D) 1000 units tablet Take 5,000 Units by mouth daily.     cyanocobalamin 1000 MCG tablet Take 100 mcg by mouth daily.     dorzolamide-timolol (COSOPT) 22.3-6.8 MG/ML ophthalmic solution Place 22.3 mLs into both eyes 2 (two) times daily.     fish oil-omega-3 fatty acids 1000 MG capsule Take 1 g by mouth daily.     Multiple Vitamin (MULTIVITAMIN) capsule Take 1 capsule by mouth daily.     VITAMIN D PO Take 1 tablet by mouth daily.     VYZULTA 0.024 % SOLN Place 1 drop into the left eye at bedtime.     No current facility-administered medications on file prior to visit.    Review of Systems  Constitutional:  Negative for activity change, appetite change, chills, diaphoresis, fatigue, fever and unexpected weight change.  HENT:  Negative for congestion, ear pain, rhinorrhea, sinus pressure and sore throat.   Eyes:  Negative for pain, redness and visual disturbance.  Respiratory:  Negative for cough, shortness of breath and wheezing.   Cardiovascular:  Negative for chest pain and palpitations.  Gastrointestinal:  Positive for abdominal pain. Negative for abdominal distention, anal bleeding, blood in stool, constipation, diarrhea, nausea, rectal pain and vomiting.  Endocrine: Negative for polydipsia and polyuria.  Genitourinary:  Positive for flank pain. Negative for dysuria, frequency and urgency.  Musculoskeletal:  Negative for arthralgias, back pain and myalgias.  Skin:  Negative for pallor and rash.  Allergic/Immunologic: Negative for environmental allergies.  Neurological:  Negative for dizziness, syncope and headaches.  Hematological:   Negative for adenopathy. Does not bruise/bleed easily.  Psychiatric/Behavioral:  Negative for decreased concentration and dysphoric mood. The patient is not nervous/anxious.        Objective:   Physical Exam Constitutional:      General: She is not in acute distress.    Appearance: Normal appearance. She is well-developed and  normal weight. She is not ill-appearing or diaphoretic.  HENT:     Head: Normocephalic and atraumatic.     Mouth/Throat:     Mouth: Mucous membranes are moist.  Eyes:     General: No scleral icterus.    Conjunctiva/sclera: Conjunctivae normal.     Pupils: Pupils are equal, round, and reactive to light.  Neck:     Thyroid: No thyromegaly.     Vascular: No carotid bruit or JVD.  Cardiovascular:     Rate and Rhythm: Normal rate and regular rhythm.     Heart sounds: Normal heart sounds.     No gallop.  Pulmonary:     Effort: Pulmonary effort is normal. No respiratory distress.     Breath sounds: Normal breath sounds. No stridor. No wheezing, rhonchi or rales.  Chest:     Chest wall: No tenderness.  Abdominal:     General: Abdomen is flat. Bowel sounds are normal. There is no distension or abdominal bruit.     Palpations: Abdomen is soft. There is no fluid wave, hepatomegaly, splenomegaly, mass or pulsatile mass.     Tenderness: There is no abdominal tenderness. There is no right CVA tenderness or left CVA tenderness.     Hernia: No hernia is present.  Musculoskeletal:     Cervical back: Normal range of motion and neck supple.     Right lower leg: No edema.     Left lower leg: No edema.  Lymphadenopathy:     Cervical: No cervical adenopathy.  Skin:    General: Skin is warm and dry.     Coloration: Skin is not pale.     Findings: No rash.  Neurological:     Mental Status: She is alert.     Coordination: Coordination normal.     Deep Tendon Reflexes: Reflexes are normal and symmetric. Reflexes normal.  Psychiatric:        Mood and Affect: Mood normal.            Assessment & Plan:   Problem List Items Addressed This Visit       Genitourinary   Microscopic hematuria    With left flank pain that moved to LLQ abd   Given urine strainer  Kub  Labs  U culture pending   Kidney stone is high in differential   Call back and Er precautions noted in detail today   Will continue over the counter analgesic and warm compress Encouraged to increase water intake        Relevant Orders   Urine Culture   POCT UA - Microscopic Only (Completed)     Other   Left flank pain - Primary    Pain started in left flank/ cva area - sharp in nature and over weekend moved into LLQ abd area /but no tenderness (at home or here) Intermittent  No history of diverticular dz  Urinalysis notes micro hematuria  ? Possible kidney stone  Ucx pending  KUB ordered  Labs ordered        Relevant Orders   POCT Urinalysis Dipstick (Automated) (Completed)   CBC with Differential/Platelet   Comprehensive metabolic panel   DG Abd 1 View   Urine Culture   POCT UA - Microscopic Only (Completed)   Left lower quadrant abdominal pain    No other symptoms Sharp and intermittent, started in flank  Reassuring exam-no tenderness  Notable micro hematuria   Ucx and labs ordered KUB Consider CT based on results  Relevant Orders   CBC with Differential/Platelet   Comprehensive metabolic panel   DG Abd 1 View

## 2023-07-22 NOTE — Patient Instructions (Signed)
Drink lots and lots of water  We will culture urine to make sure there is no infection   Lab today Xray today   We will contact you with result and plan   Strain your urine and watch for a stone

## 2023-07-22 NOTE — Assessment & Plan Note (Signed)
With left flank pain that moved to LLQ abd   Given urine strainer  Kub  Labs  U culture pending   Kidney stone is high in differential   Call back and Er precautions noted in detail today   Will continue over the counter analgesic and warm compress Encouraged to increase water intake

## 2023-07-22 NOTE — Assessment & Plan Note (Signed)
Pain started in left flank/ cva area - sharp in nature and over weekend moved into LLQ abd area /but no tenderness (at home or here) Intermittent  No history of diverticular dz  Urinalysis notes micro hematuria  ? Possible kidney stone  Ucx pending  KUB ordered - no renal stone seen  Labs ordered  Pending results/ if not improvement consider CT Call back and Er precautions noted in detail today

## 2023-07-23 LAB — COMPREHENSIVE METABOLIC PANEL
ALT: 10 U/L (ref 0–35)
AST: 16 U/L (ref 0–37)
Albumin: 3.9 g/dL (ref 3.5–5.2)
Alkaline Phosphatase: 41 U/L (ref 39–117)
BUN: 23 mg/dL (ref 6–23)
CO2: 28 meq/L (ref 19–32)
Calcium: 10 mg/dL (ref 8.4–10.5)
Chloride: 102 meq/L (ref 96–112)
Creatinine, Ser: 0.95 mg/dL (ref 0.40–1.20)
GFR: 55.51 mL/min — ABNORMAL LOW (ref >60.00)
Glucose, Bld: 88 mg/dL (ref 70–99)
Potassium: 4.1 meq/L (ref 3.5–5.1)
Sodium: 137 meq/L (ref 135–145)
Total Bilirubin: 0.5 mg/dL (ref 0.2–1.2)
Total Protein: 6.4 g/dL (ref 6.0–8.3)

## 2023-07-23 LAB — CBC WITH DIFFERENTIAL/PLATELET
Basophils Absolute: 0.1 10*3/uL (ref 0.0–0.1)
Basophils Relative: 0.8 % (ref 0.0–3.0)
Eosinophils Absolute: 0.2 10*3/uL (ref 0.0–0.7)
Eosinophils Relative: 2.2 % (ref 0.0–5.0)
HCT: 37.8 % (ref 36.0–46.0)
Hemoglobin: 12.4 g/dL (ref 12.0–15.0)
Lymphocytes Relative: 14.4 % (ref 12.0–46.0)
Lymphs Abs: 1.1 10*3/uL (ref 0.7–4.0)
MCHC: 32.9 g/dL (ref 30.0–36.0)
MCV: 96.2 fL (ref 78.0–100.0)
Monocytes Absolute: 0.8 10*3/uL (ref 0.1–1.0)
Monocytes Relative: 10.4 % (ref 3.0–12.0)
Neutro Abs: 5.5 10*3/uL (ref 1.4–7.7)
Neutrophils Relative %: 72.2 % (ref 43.0–77.0)
Platelets: 153 10*3/uL (ref 150.0–400.0)
RBC: 3.93 Mil/uL (ref 3.87–5.11)
RDW: 13.9 % (ref 11.5–15.5)
WBC: 7.7 10*3/uL (ref 4.0–10.5)

## 2023-07-23 LAB — URINE CULTURE
MICRO NUMBER:: 15626785
Result:: NO GROWTH
SPECIMEN QUALITY:: ADEQUATE

## 2023-07-25 ENCOUNTER — Ambulatory Visit
Admission: RE | Admit: 2023-07-25 | Discharge: 2023-07-25 | Disposition: A | Discharge status: Home / Self Care | Payer: Medicare Other | Source: Ambulatory Visit | Attending: Family Medicine | Admitting: Family Medicine

## 2023-07-25 ENCOUNTER — Telehealth: Payer: Self-pay | Admitting: Family Medicine

## 2023-07-25 DIAGNOSIS — R109 Unspecified abdominal pain: Secondary | ICD-10-CM | POA: Insufficient documentation

## 2023-07-25 DIAGNOSIS — R319 Hematuria, unspecified: Secondary | ICD-10-CM | POA: Diagnosis not present

## 2023-07-25 DIAGNOSIS — N281 Cyst of kidney, acquired: Secondary | ICD-10-CM | POA: Diagnosis not present

## 2023-07-25 DIAGNOSIS — R3129 Other microscopic hematuria: Secondary | ICD-10-CM | POA: Insufficient documentation

## 2023-07-25 DIAGNOSIS — R1032 Left lower quadrant pain: Secondary | ICD-10-CM

## 2023-07-25 DIAGNOSIS — S3210XA Unspecified fracture of sacrum, initial encounter for closed fracture: Secondary | ICD-10-CM | POA: Diagnosis not present

## 2023-07-25 DIAGNOSIS — N132 Hydronephrosis with renal and ureteral calculous obstruction: Secondary | ICD-10-CM | POA: Diagnosis not present

## 2023-07-25 NOTE — Telephone Encounter (Signed)
Referral dpt notified

## 2023-07-25 NOTE — Telephone Encounter (Signed)
Patient called in and stated that she would like the referral to go to [facility]. She was hoping to get something done today but informed her that Dr. Milinda Antis is out of the office.

## 2023-07-25 NOTE — Telephone Encounter (Signed)
I ordered a stat CT and preferenced [place]  Thanks  Would someone please let the referral team know ?   So sorry I am out of the office today

## 2023-07-25 NOTE — Telephone Encounter (Signed)
Pt called in and stated that she is in pain in the middle of the night a li pain daytime but lots of pain on  left side of the body and would like for a call back if possible to talk about CT scan

## 2023-07-27 ENCOUNTER — Telehealth: Payer: Self-pay | Admitting: Family Medicine

## 2023-07-27 DIAGNOSIS — M810 Age-related osteoporosis without current pathological fracture: Secondary | ICD-10-CM

## 2023-07-27 DIAGNOSIS — E559 Vitamin D deficiency, unspecified: Secondary | ICD-10-CM

## 2023-07-27 DIAGNOSIS — E785 Hyperlipidemia, unspecified: Secondary | ICD-10-CM

## 2023-07-27 DIAGNOSIS — Z13220 Encounter for screening for lipoid disorders: Secondary | ICD-10-CM

## 2023-07-27 NOTE — Telephone Encounter (Signed)
-----   Message from Alvina Chou sent at 07/10/2023  3:58 PM EDT ----- Regarding: Lab orders for Mon, 10.28.24 Patient is scheduled for CPX labs, please order future labs, Thanks , Camelia Eng

## 2023-07-28 ENCOUNTER — Ambulatory Visit
Admission: RE | Admit: 2023-07-28 | Discharge: 2023-07-28 | Disposition: A | Discharge status: Home / Self Care | Payer: Medicare Other | Source: Ambulatory Visit | Attending: Family Medicine | Admitting: Family Medicine

## 2023-07-28 ENCOUNTER — Other Ambulatory Visit (INDEPENDENT_AMBULATORY_CARE_PROVIDER_SITE_OTHER): Payer: Medicare Other

## 2023-07-28 ENCOUNTER — Telehealth: Payer: Self-pay

## 2023-07-28 DIAGNOSIS — E785 Hyperlipidemia, unspecified: Secondary | ICD-10-CM

## 2023-07-28 DIAGNOSIS — Z1231 Encounter for screening mammogram for malignant neoplasm of breast: Secondary | ICD-10-CM | POA: Diagnosis not present

## 2023-07-28 DIAGNOSIS — Z13220 Encounter for screening for lipoid disorders: Secondary | ICD-10-CM | POA: Diagnosis not present

## 2023-07-28 DIAGNOSIS — R109 Unspecified abdominal pain: Secondary | ICD-10-CM

## 2023-07-28 DIAGNOSIS — R3129 Other microscopic hematuria: Secondary | ICD-10-CM

## 2023-07-28 DIAGNOSIS — E559 Vitamin D deficiency, unspecified: Secondary | ICD-10-CM

## 2023-07-28 DIAGNOSIS — N200 Calculus of kidney: Secondary | ICD-10-CM | POA: Insufficient documentation

## 2023-07-28 LAB — LIPID PANEL
Cholesterol: 186 mg/dL (ref 0–200)
HDL: 80.5 mg/dL (ref >39.00)
LDL Cholesterol: 88 mg/dL (ref 0–99)
NonHDL: 105.98
Total CHOL/HDL Ratio: 2
Triglycerides: 91 mg/dL (ref 0.0–149.0)
VLDL: 18.2 mg/dL (ref 0.0–40.0)

## 2023-07-28 LAB — COMPREHENSIVE METABOLIC PANEL
ALT: 12 U/L (ref 0–35)
AST: 17 U/L (ref 0–37)
Albumin: 4 g/dL (ref 3.5–5.2)
Alkaline Phosphatase: 45 U/L (ref 39–117)
BUN: 20 mg/dL (ref 6–23)
CO2: 32 meq/L (ref 19–32)
Calcium: 10.1 mg/dL (ref 8.4–10.5)
Chloride: 106 meq/L (ref 96–112)
Creatinine, Ser: 0.64 mg/dL (ref 0.40–1.20)
GFR: 81.82 mL/min (ref >60.00)
Glucose, Bld: 89 mg/dL (ref 70–99)
Potassium: 4.1 meq/L (ref 3.5–5.1)
Sodium: 142 meq/L (ref 135–145)
Total Bilirubin: 0.4 mg/dL (ref 0.2–1.2)
Total Protein: 6.3 g/dL (ref 6.0–8.3)

## 2023-07-28 LAB — VITAMIN D 25 HYDROXY (VIT D DEFICIENCY, FRACTURES): VITD: 53.05 ng/mL (ref 30.00–100.00)

## 2023-07-28 MED ORDER — TAMSULOSIN HCL 0.4 MG PO CAPS: 0.4000 mg | ORAL_CAPSULE | Freq: Every day | ORAL | 0 refills | Status: DC | PRN

## 2023-07-28 MED ORDER — TRAMADOL HCL 50 MG PO TABS: 50.0000 mg | ORAL_TABLET | Freq: Three times a day (TID) | ORAL | 0 refills | Status: AC | PRN

## 2023-07-28 NOTE — Telephone Encounter (Signed)
Referral in  Please teams the referral team to let them know

## 2023-07-28 NOTE — Telephone Encounter (Signed)
Sending note to Dr Milinda Antis and [employer].

## 2023-07-28 NOTE — Telephone Encounter (Signed)
Pt said the last 3 nights her pain was severe, pt said the pain is better today an manageable but she still hasn't passed a stone. Pt agrees with referral to urology she would like to see someone in [place]. Pt advise PCP will get referral going and if she hasn't received a call in the next few days to let us know. In the meantime pt will take tramadol prn for pain and start the flomax.

## 2023-07-28 NOTE — Telephone Encounter (Signed)
Referral dpt notified

## 2023-07-28 NOTE — Telephone Encounter (Signed)
Please ask how her symptoms are  I want to refer to urology for renal stone- does she pref [place] or [place]?    Pain medicine Marcia Brash (caution of sedation/falls and constipation with this)  Also generic flomax -this may help her pass stone-if any side effects let us know

## 2023-07-30 ENCOUNTER — Ambulatory Visit (INDEPENDENT_AMBULATORY_CARE_PROVIDER_SITE_OTHER): Payer: Medicare Other | Admitting: Urology

## 2023-07-30 ENCOUNTER — Encounter: Payer: Self-pay | Admitting: Urology

## 2023-07-30 VITALS — BP 156/65 | HR 66 | Ht 67.0 in | Wt 105.0 lb

## 2023-07-30 DIAGNOSIS — N201 Calculus of ureter: Secondary | ICD-10-CM

## 2023-07-30 DIAGNOSIS — N200 Calculus of kidney: Secondary | ICD-10-CM

## 2023-07-30 NOTE — Patient Instructions (Signed)
Kidney Stones Kidney stones are rock-like masses that form inside of the kidneys. Kidneys are organs that make pee (urine). A kidney stone may move into other parts of the urinary tract, including: The tubes that connect the kidneys to the bladder (ureters). The bladder. The tube that carries urine out of the body (urethra). Kidney stones can cause very bad pain and can block the flow of pee. The stone usually leaves your body through your pee. A doctor may need to take out the stone. What are the causes? Kidney stones may be caused by: Too much calcium in the body. This may be caused by too much parathyroid hormone in the blood. Uric acid crystals in the bladder. The body makes uric acid when you eat certain foods. Narrowing of one or both of the ureters. A kidney blockage that you were born with. Past surgery on the kidney or the ureters. What increases the risk? You are more likely to develop this condition if: You have had a kidney stone in the past. Other people in your family have had kidney stones. You do not drink enough water. You eat a diet that is high in protein, salt (sodium), or sugar. You are very overweight (obese). What are the signs or symptoms? Symptoms of a kidney stone may include: Pain in the side of the belly, right below the ribs. Pain usually spreads to the groin. Needing to pee often or right away. Pain when peeing. Blood in your pee. Feeling like you may vomit (nauseous). Vomiting. Fever and chills. How is this treated? Treatment depends on the size, location, and makeup of the kidney stones. The stones will often pass out of the body when you pee. You may need to: Drink more fluid to help pass the stone. In some cases, you may be given fluids through an IV tube at the hospital. Take medicine for pain. Change your diet to help keep kidney stones from coming back. Sometimes, you may need: A procedure to break up kidney stones using a beam of light (laser)  or shock waves. Surgery to remove the kidney stones. Follow these instructions at home: Medicines Take over-the-counter and prescription medicines only as told by your doctor. Ask your doctor if the medicine prescribed to you requires you to avoid driving or using machinery. Eating and drinking Drink enough fluid to keep your pee pale yellow. You may be told to drink at least 8-10 glasses of water each day. This will help you pass the stone. If told by your doctor, change your diet. You may be told to: Limit how much salt you eat. Eat more fruits and vegetables. Limit how much meat, poultry, fish, and eggs you eat. Follow instructions from your doctor about what you may eat and drink. General instructions Collect pee samples as told by your doctor. You may need to collect a pee sample: 24 hours after a stone comes out. 8-12 weeks after a stone comes out, and every 6-12 months after that. Strain your pee every time you pee. Use the strainer that your doctor recommends. Do not throw out the stone. Keep it so that it can be tested by your doctor. Keep all follow-up visits. You may need X-rays and ultrasounds to make sure the stone has come out. How is this prevented? To prevent another kidney stone: Drink enough fluid to keep your pee pale yellow. This is the best way to prevent kidney stones. Eat healthy foods. Avoid certain foods as told by your doctor. You  may be told to eat less protein. Stay at a healthy weight. Where to find more information National Kidney Foundation (NKF): kidney.org [facility]): urologyhealth.org Contact a doctor if: You have pain that gets worse or does not get better with medicine. Get help right away if: You have a fever or chills. You get very bad pain. You get new pain in your belly. You faint. You cannot pee. This information is not intended to replace advice given to you by your health care provider. Make sure you discuss any  questions you have with your health care provider. Document Revised: 05/10/2022 Document Reviewed: 05/10/2022 Elsevier Patient Education  2024 [employer]  Laser Therapy for Kidney Stones Laser therapy for kidney stones is a procedure to break up rock-like masses that form inside the kidneys (kidney stones). It is done using a device that beams a strong light (laser) on the kidney stones. This breaks the stones up into small pieces. These small pieces may leave your body when you pee (urinate) or may be taken out during the procedure.  You may need laser therapy if you have kidney stones that are painful or that are stopping you from being able to pee. Tell a health care provider about: Any allergies you have. All medicines you are taking, including vitamins, herbs, eye drops, creams, and over-the-counter medicines. Any problems you or family members have had with anesthesia. Any bleeding problems you have. Any surgeries you have had. Any medical conditions you have. Whether you are pregnant or may be pregnant. What are the risks? Your health care provider will talk with you about risks. These may include: Infection. Bleeding. Allergic reactions to medicines. Damage to: The part of your body that drains pee (urine) from the bladder (urethra). The bladder. The tube that connects the bladder to the kidneys (ureter). Urinary tract infection (UTI). Urethral stricture. This is when the urethra is narrowed by scarring. Trouble peeing. Blockage of the kidney. This may be caused by a piece of kidney stone. What happens before the procedure? When to stop eating and drinking Follow instructions from your provider about what you may eat and drink. These may include: 8 hours before the procedure Stop eating most foods. Do not eat meat, fried foods, or fatty foods. Eat only light foods, such as toast or crackers. All liquids are okay except energy drinks and alcohol. 6 hours before the  procedure Stop eating. Drink only clear liquids, such as water, clear fruit juice, black coffee, plain tea, and sports drinks. Do not drink energy drinks or alcohol. 2 hours before the procedure Stop drinking all liquids. You may be allowed to take medicines with small sips of water. If you do not follow your provider's instructions, your procedure may be delayed or canceled. Medicines Ask your provider about: Changing or stopping your regular medicines. These include any diabetes medicines or blood thinners you take. Taking medicines such as aspirin and ibuprofen. These medicines can thin your blood. Do not take them unless your provider tells you to. Taking over-the-counter medicines, vitamins, herbs, and supplements. Tests You may have a physical exam before the procedure. You may also have tests done. These may include: Imaging tests. Blood or pee tests. Surgery safety Ask your provider: How your surgery site will be marked. What steps will be taken to help prevent infection. These steps may include: Removing hair at the surgery site. Washing skin with a soap that kills germs. Taking antibiotics. General instructions Do not use any products  that contain nicotine or tobacco for at least 4 weeks before the procedure. These products include cigarettes, chewing tobacco, and vaping devices, such as e-cigarettes. If you need help quitting, ask your provider. If you will be going home right after the procedure, plan to have a responsible adult: Take you home from the hospital or clinic. You will not be allowed to drive. Care for you for the time you are told. What happens during the procedure?  An IV will be inserted into one of your veins. You will be given: A sedative. This helps you relax. Anesthesia. This keeps you from feeling pain. It will make you fall asleep for surgery. A tool with a camera on the end (ureteroscope) will be put into your urethra. It will be moved through your  bladder to your kidney. It will send pictures to a screen in the operating room. This will show what parts of your kidney need to be treated. A tube will be put through the ureteroscope. It will be moved into your kidney. The laser device will be put into your kidney through the tube. The laser will be used to break up the kidney stones. A tool with a tiny wire basket may be put through the tube into your kidney. This can help remove the small pieces of the kidney stone. A small mesh tube (stent) may be placed to allow your kidney to drain. The tube and ureteroscope will be taken out at the end of the surgery. The procedure may vary among providers and hospitals. What happens after the procedure? Your blood pressure, heart rate, breathing rate, and blood oxygen level will be monitored until you leave the hospital or clinic. If you had a stent placed, it may have a string that will be secured to your skin. This helps your provider remove the stent. You may be given a strainer to collect any stone pieces that you pass in your pee. Your provider may have these tested. This information is not intended to replace advice given to you by your health care provider. Make sure you discuss any questions you have with your health care provider. Document Revised: 05/17/2022 Document Reviewed: 05/17/2022 Elsevier Patient Education  2024 [employer].

## 2023-07-30 NOTE — Progress Notes (Signed)
07/30/23 9:26 AM   Kathy [name] Jan 12, 1941 [id number]  CC: Left distal ureteral stone  HPI: 82 year old female who reports 1 week of left-sided groin and low back pain.  CT scan with PCP on 07/25/2023 showed a 5 mm left distal ureteral stone with upstream hydronephrosis, small nonobstructive left renal stones.  Urine culture was negative.  She was started on Flomax yesterday.  Her pain has been relatively well-controlled and she has taken a few doses of Tylenol.  She denies any fever or UTI symptoms.  She denies any prior history of kidney stones   PMH: Past Medical History:  Diagnosis Date   Bilateral sensorineural hearing loss    Cancer (HCC)    Hx of breast CA   Glaucoma    Glaucoma (increased eye pressure)    HOH (hard of hearing)    wears hearing aids both ears   Hyperlipidemia    Osteoporosis    Vitamin D deficiency     Surgical History: Past Surgical History:  Procedure Laterality Date   BREAST SURGERY  04/06/2001   mastectomy - left side   CATARACT EXTRACTION W/PHACO Right 01/29/2023   Procedure: CATARACT EXTRACTION PHACO AND INTRAOCULAR LENS PLACEMENT (IOC) RIGHT KAHOOK DUAL BLADE  GONIOTOMY  VISION BLUE  11.28  01:00.0;  Surgeon: Lockie Mola, MD;  Location: [facility];  Service: Ophthalmology;  Laterality: Right;   DILATION AND CURETTAGE OF UTERUS     EYE SURGERY  2010   cataract - left   MASTECTOMY  03/2001   left   TONSILLECTOMY       Family History: Family History  Problem Relation Age of Onset   Heart disease Mother        CHF   Hypertension Mother    Stroke Father    Hypertension Father    Osteoporosis Father    Heart disease Father        CHF   Breast cancer Cousin    Colon cancer Neg Hx     Social History:  reports that she has never smoked. She has never used smokeless tobacco. She reports that she does not drink alcohol and does not use drugs.  Physical Exam: BP (!) 156/65 (BP Location: Left Arm, Patient Position:  Sitting, Cuff Size: Normal)   Pulse 66   Ht 5\' 7"  (1.702 m)   Wt 105 lb (47.6 kg)   BMI 16.45 kg/m    Constitutional:  Alert and oriented, No acute distress. Cardiovascular: No clubbing, cyanosis, or edema. Respiratory: Normal respiratory effort, no increased work of breathing. GI: Abdomen is soft, nontender, nondistended, no abdominal masses   Laboratory Data: Reviewed, see HPI  Pertinent Imaging: I have personally viewed and interpreted the CT scan showing a 5 mm left distal ureteral stone with upstream hydronephrosis, small nonobstructive left renal stones.  Assessment & Plan:   82 year old female with 5 mm left distal ureteral stone, pain well-controlled on Tylenol, no clinical or laboratory evidence of infection.  We discussed various treatment options for urolithiasis including observation with or without medical expulsive therapy, shockwave lithotripsy (SWL), ureteroscopy and laser lithotripsy with stent placement, and percutaneous nephrolithotomy.  We discussed that management is based on stone size, location, density, patient co-morbidities, and patient preference. Stones <47mm in size have a >80% spontaneous passage rate. Data surrounding the use of tamsulosin for medical expulsive therapy is controversial, but meta analyses suggests it is most efficacious for distal stones between 5-36mm in size. Possible side effects include dizziness/lightheadedness, and retrograde  ejaculation.SWL has a lower stone free rate in a single procedure, but also a lower complication rate compared to ureteroscopy and avoids a stent and associated stent related symptoms. Possible complications include renal hematoma, steinstrasse, and need for additional treatment.Ureteroscopy with laser lithotripsy and stent placement has a higher stone free rate than SWL in a single procedure, however increased complication rate including possible infection, ureteral injury, bleeding, and stent related morbidity. Common  stent related symptoms include dysuria, urgency/frequency, and flank pain.  After an extensive discussion of the risks and benefits of the above treatment options, the patient would like to proceed with trial of medical expulsive therapy.  Return precautions discussed including fever over 101 or uncontrolled pain.  PCP 1 week urinalysis and symptom check, re-discuss ureteroscopy at that time if persistent symptoms    Legrand Rams, MD 07/30/2023  [facility] [address] [place], [place] [address] [phone]

## 2023-08-04 ENCOUNTER — Encounter: Payer: Self-pay | Admitting: Family Medicine

## 2023-08-04 ENCOUNTER — Ambulatory Visit (INDEPENDENT_AMBULATORY_CARE_PROVIDER_SITE_OTHER): Payer: Medicare Other | Admitting: Family Medicine

## 2023-08-04 VITALS — BP 134/70 | HR 65 | Temp 97.6°F | Ht 64.0 in | Wt 104.5 lb

## 2023-08-04 DIAGNOSIS — N200 Calculus of kidney: Secondary | ICD-10-CM | POA: Diagnosis not present

## 2023-08-04 DIAGNOSIS — M810 Age-related osteoporosis without current pathological fracture: Secondary | ICD-10-CM | POA: Diagnosis not present

## 2023-08-04 DIAGNOSIS — E559 Vitamin D deficiency, unspecified: Secondary | ICD-10-CM | POA: Diagnosis not present

## 2023-08-04 DIAGNOSIS — E785 Hyperlipidemia, unspecified: Secondary | ICD-10-CM | POA: Diagnosis not present

## 2023-08-04 DIAGNOSIS — D696 Thrombocytopenia, unspecified: Secondary | ICD-10-CM | POA: Diagnosis not present

## 2023-08-04 DIAGNOSIS — R109 Unspecified abdominal pain: Secondary | ICD-10-CM | POA: Diagnosis not present

## 2023-08-04 DIAGNOSIS — R636 Underweight: Secondary | ICD-10-CM

## 2023-08-04 DIAGNOSIS — Z8781 Personal history of (healed) traumatic fracture: Secondary | ICD-10-CM | POA: Diagnosis not present

## 2023-08-04 NOTE — Assessment & Plan Note (Signed)
Seeing urology  Ureteral stone on left -not passed yet  Has appointment this week to discuss treatment options  Encouraged good fluid intake

## 2023-08-04 NOTE — Assessment & Plan Note (Signed)
None new Taking prolia

## 2023-08-04 NOTE — Progress Notes (Signed)
Subjective:    Patient ID: Kathy [name], female    DOB: 28-Feb-1941, 82 y.o.   MRN: [id number]  HPI  Here for annual follow up of chronic health problems   Wt Readings from Last 3 Encounters:  08/04/23 104 lb 8 oz (47.4 kg)  07/30/23 105 lb (47.6 kg)  07/22/23 104 lb (47.2 kg)   17.94 kg/m  Vitals:   08/04/23 0954  BP: 134/70  Pulse: 65  Temp: 97.6 F (36.4 C)  SpO2: 99%    Immunization History  Administered Date(s) Administered   Fluad Quad(high Dose 65+) 06/09/2019   Influenza, High Dose Seasonal PF 06/04/2017, 06/18/2018, 06/02/2020, 07/04/2022   Influenza,inj,Quad PF,6+ Mos 05/30/2014, 06/13/2015, 06/17/2016   Influenza-Unspecified 06/17/2013   PFIZER(Purple Top)SARS-COV-2 Vaccination 10/05/2019, 10/26/2019   PNEUMOCOCCAL CONJUGATE-20 07/04/2022   Pneumococcal Conjugate-13 05/30/2014   Pneumococcal Polysaccharide-23 05/09/2008   Td 02/25/2003   Zoster Recombinant(Shingrix) 01/14/2017, 07/17/2017   Zoster, Live 04/17/2006    There are no preventive care reminders to display for this patient.  Tetanus shot - due   Had a recent new kidney stone   Mammogram - 07/2023  Personal history of breast cancer  Self breast exam- no changes   Gyn health No changes    Colon cancer screening  Had neg cologuard 2019   Bone health  Dexa - 06/2021 osteoporosis / scheduled in April  Prolia now  Was on evista in past  Falls-no recent  Fractures- past compression fracture of spine  Supplements  ca and D Last vitamin D Lab Results  Component Value Date   VD25OH 53.05 07/28/2023    Exercise  Walking every day  Needs to do strength training   Mood    04/23/2023   10:53 AM 04/19/2022    3:52 PM 04/18/2021   11:19 AM 07/05/2019    9:49 AM 06/26/2018   10:10 AM  Depression screen PHQ 2/9  Decreased Interest 0 0 0 0 0  Down, Depressed, Hopeless 0 0 0 0 0  PHQ - 2 Score 0 0 0 0 0  Altered sleeping   0 0 0  Tired, decreased energy   0 0 0  Change in  appetite   0 0 0  Feeling bad or failure about yourself    0 0 0  Trouble concentrating   0 0 0  Moving slowly or fidgety/restless   0 0 0  Suicidal thoughts   0 0 0  PHQ-9 Score   0 0 0  Difficult doing work/chores   Not difficult at all Not difficult at all Not difficult at all    Renal stone  No passed yet  Sees urology on wed  Does not want a stent Pain is not too bad  Tiny bit of blood in urine  (? If crumbled part of stone)  Still feels pressure when urinating  Taking flomax   Thrombocytopenia in past Normal plt today Lab Results  Component Value Date   WBC 7.7 07/22/2023   HGB 12.4 07/22/2023   HCT 37.8 07/22/2023   MCV 96.2 07/22/2023   PLT 153.0 07/22/2023   Cholesterol screen Lab Results  Component Value Date   CHOL 186 07/28/2023   CHOL 187 07/11/2022   CHOL 204 (H) 04/18/2021   Lab Results  Component Value Date   HDL 80.50 07/28/2023   HDL 87.60 07/11/2022   HDL 89.30 04/18/2021   Lab Results  Component Value Date   LDLCALC 88 07/28/2023   LDLCALC 85  07/11/2022   LDLCALC 98 04/18/2021   Lab Results  Component Value Date   TRIG 91.0 07/28/2023   TRIG 71.0 07/11/2022   TRIG 83.0 04/18/2021   Lab Results  Component Value Date   CHOLHDL 2 07/28/2023   CHOLHDL 2 07/11/2022   CHOLHDL 2 04/18/2021   Lab Results  Component Value Date   LDLDIRECT 107.3 05/24/2013   LDLDIRECT 99.8 05/19/2012   LDLDIRECT 101.6 05/17/2011   Lab Results  Component Value Date   NA 142 07/28/2023   K 4.1 07/28/2023   CO2 32 07/28/2023   GLUCOSE 89 07/28/2023   BUN 20 07/28/2023   CREATININE 0.64 07/28/2023   CALCIUM 10.1 07/28/2023   GFR 81.82 07/28/2023   GFRNONAA 113.74 05/11/2010   Lab Results  Component Value Date   ALT 12 07/28/2023   AST 17 07/28/2023   ALKPHOS 45 07/28/2023   BILITOT 0.4 07/28/2023   Lab Results  Component Value Date   TSH 1.44 07/11/2022      Patient Active Problem List   Diagnosis Date Noted   Kidney stone 07/28/2023    Left flank pain 07/22/2023   Left lower quadrant abdominal pain 07/22/2023   Microscopic hematuria 07/22/2023   Thrombocytopenia (HCC) 04/17/2021   Varicose veins of leg with pain, right 03/23/2021   Hyperlipidemia 02/27/2021   Right arm pain 07/13/2019   Hearing loss 06/29/2018   Colon cancer screening 06/29/2018   Underweight 06/17/2016   Varicose vein of leg 06/17/2016   Vitamin D deficiency 06/17/2016   Estrogen deficiency 06/13/2015   Routine general medical examination at a health care facility 06/05/2015   INTERMITTENT VERTIGO 06/26/2010   H/O compression fracture of spine 05/10/2009   Pathologic fracture of vertebrae 05/10/2009   OVERACTIVE BLADDER 04/01/2007   Osteoporosis 04/01/2007   Personal history of breast cancer 04/01/2007   Past Medical History:  Diagnosis Date   Bilateral sensorineural hearing loss    Cancer (HCC)    Hx of breast CA   Glaucoma    Glaucoma (increased eye pressure)    HOH (hard of hearing)    wears hearing aids both ears   Hyperlipidemia    Osteoporosis    Vitamin D deficiency    Past Surgical History:  Procedure Laterality Date   BREAST SURGERY  04/06/2001   mastectomy - left side   CATARACT EXTRACTION W/PHACO Right 01/29/2023   Procedure: CATARACT EXTRACTION PHACO AND INTRAOCULAR LENS PLACEMENT (IOC) RIGHT KAHOOK DUAL BLADE  GONIOTOMY  VISION BLUE  11.28  01:00.0;  Surgeon: Lockie Mola, MD;  Location: [facility];  Service: Ophthalmology;  Laterality: Right;   DILATION AND CURETTAGE OF UTERUS     EYE SURGERY  09/30/2008   cataract - left   MASTECTOMY  03/30/2001   left   TONSILLECTOMY     Social History   Tobacco Use   Smoking status: Never   Smokeless tobacco: Never  Vaping Use   Vaping status: Never Used  Substance Use Topics   Alcohol use: No   Drug use: No   Family History  Problem Relation Age of Onset   Heart disease Mother        CHF   Hypertension Mother    Stroke Father    Hypertension  Father    Osteoporosis Father    Heart disease Father        CHF   Breast cancer Cousin    Colon cancer Neg Hx    No Known Allergies Current Outpatient Medications  on File Prior to Visit  Medication Sig Dispense Refill   aspirin 81 MG tablet Take 81 mg by mouth daily.     BRIMONIDINE TARTRATE OP Place 1 drop into the left eye 2 (two) times daily.     Calcium Carbonate-Vitamin D (CALCIUM-VITAMIN D3) 600-125 MG-UNIT TABS Take by mouth.     cholecalciferol (VITAMIN D) 1000 units tablet Take 5,000 Units by mouth daily.     cyanocobalamin 1000 MCG tablet Take 100 mcg by mouth daily.     dorzolamide-timolol (COSOPT) 22.3-6.8 MG/ML ophthalmic solution Place 22.3 mLs into both eyes 2 (two) times daily.     fish oil-omega-3 fatty acids 1000 MG capsule Take 1 g by mouth daily.     Multiple Vitamin (MULTIVITAMIN) capsule Take 1 capsule by mouth daily.     tamsulosin (FLOMAX) 0.4 MG CAPS capsule Take 1 capsule (0.4 mg total) by mouth daily as needed (for kidney stone). 20 capsule 0   VITAMIN D PO Take 1 tablet by mouth daily.     VYZULTA 0.024 % SOLN Place 1 drop into the left eye at bedtime.     No current facility-administered medications on file prior to visit.    Review of Systems  Constitutional:  Negative for activity change, appetite change, fatigue, fever and unexpected weight change.  HENT:  Negative for congestion, ear pain, rhinorrhea, sinus pressure and sore throat.   Eyes:  Negative for pain, redness and visual disturbance.  Respiratory:  Negative for cough, shortness of breath and wheezing.   Cardiovascular:  Negative for chest pain and palpitations.  Gastrointestinal:  Negative for abdominal pain, blood in stool, constipation and diarrhea.  Endocrine: Negative for polydipsia and polyuria.  Genitourinary:  Positive for flank pain and hematuria. Negative for dysuria, frequency and urgency.  Musculoskeletal:  Negative for arthralgias, back pain and myalgias.  Skin:  Negative for  pallor and rash.  Allergic/Immunologic: Negative for environmental allergies.  Neurological:  Negative for dizziness, syncope and headaches.  Hematological:  Negative for adenopathy. Does not bruise/bleed easily.  Psychiatric/Behavioral:  Negative for decreased concentration and dysphoric mood. The patient is not nervous/anxious.        Objective:   Physical Exam Constitutional:      General: She is not in acute distress.    Appearance: Normal appearance. She is well-developed. She is not ill-appearing or diaphoretic.     Comments: Underweight  Baseline No changes   HENT:     Head: Normocephalic and atraumatic.     Right Ear: Tympanic membrane, ear canal and external ear normal.     Left Ear: Tympanic membrane, ear canal and external ear normal.     Nose: Nose normal. No congestion.     Mouth/Throat:     Mouth: Mucous membranes are moist.     Pharynx: Oropharynx is clear. No posterior oropharyngeal erythema.  Eyes:     General: No scleral icterus.    Extraocular Movements: Extraocular movements intact.     Conjunctiva/sclera: Conjunctivae normal.     Pupils: Pupils are equal, round, and reactive to light.  Neck:     Thyroid: No thyromegaly.     Vascular: No carotid bruit or JVD.  Cardiovascular:     Rate and Rhythm: Normal rate and regular rhythm.     Pulses: Normal pulses.     Heart sounds: Normal heart sounds.     No gallop.  Pulmonary:     Effort: Pulmonary effort is normal. No respiratory distress.  Breath sounds: Normal breath sounds. No wheezing.     Comments: Good air exch Chest:     Chest wall: No tenderness.  Abdominal:     General: Bowel sounds are normal. There is no distension or abdominal bruit.     Palpations: Abdomen is soft. There is no mass.     Tenderness: There is no abdominal tenderness.     Hernia: No hernia is present.  Genitourinary:    Comments: Breast exam: No mass, nodules, thickening, tenderness, bulging, retraction, inflamation, nipple  discharge or skin changes noted.  No axillary or clavicular LA.     Musculoskeletal:        General: No tenderness. Normal range of motion.     Cervical back: Normal range of motion and neck supple. No rigidity. No muscular tenderness.     Right lower leg: No edema.     Left lower leg: No edema.     Comments: Mod  kyphosis   Lymphadenopathy:     Cervical: No cervical adenopathy.  Skin:    General: Skin is warm and dry.     Coloration: Skin is not pale.     Findings: No erythema or rash.     Comments: Solar lentigines diffusely   Neurological:     Mental Status: She is alert. Mental status is at baseline.     Cranial Nerves: No cranial nerve deficit.     Motor: No abnormal muscle tone.     Coordination: Coordination normal.     Gait: Gait normal.     Deep Tendon Reflexes: Reflexes are normal and symmetric. Reflexes normal.  Psychiatric:        Mood and Affect: Mood normal.        Cognition and Memory: Cognition and memory normal.           Assessment & Plan:   Problem List Items Addressed This Visit       Musculoskeletal and Integument   H/O compression fracture of spine    None new Taking prolia       Osteoporosis - Primary    Dexa was 06/2021  Has next one scheduled in april Currently taking prolia  Off evista No new falls or fracture Past spinal comp fracture   Discussed fall prevention, supplements and exercise for bone density          Genitourinary   Kidney stone    Seeing urology  Ureteral stone on left -not passed yet  Has appointment this week to discuss treatment options  Encouraged good fluid intake        Hematopoietic and Hemostatic   Thrombocytopenia (HCC)    Plt ct is normal this check        Other   Hyperlipidemia    Very good control with diet  Disc goals for lipids and reasons to control them Rev last labs with pt Rev low sat fat diet in detail  LDL of 88 High HDL      Left flank pain    From ureteral renal stone  Not  yet passed For urology appointment mid week Drinking fluids  Pain comes and goes       Underweight    Life long  Good appetite  Continues to work on calorie-protein intake       Vitamin D deficiency    Last vitamin D Lab Results  Component Value Date   VD25OH 53.05 07/28/2023   Vitamin D level is therapeutic with current supplementation Disc importance  of this to bone and overall health

## 2023-08-04 NOTE — Assessment & Plan Note (Signed)
From ureteral renal stone  Not yet passed For urology appointment mid week Drinking fluids  Pain comes and goes

## 2023-08-04 NOTE — Assessment & Plan Note (Signed)
Life long  Good appetite  Continues to work on calorie-protein intake

## 2023-08-04 NOTE — Assessment & Plan Note (Signed)
Plt ct is normal this check

## 2023-08-04 NOTE — Patient Instructions (Addendum)
Ask at the pharmacy about a tetanus shot    Keep walking Add some strength training to your routine, this is important for bone and brain health and can reduce your risk of falls and help your body use insulin properly and regulate weight  Light weights, exercise bands , and internet videos are a good way to start  Yoga (chair or regular), machines , floor exercises or a gym with machines are also good options    See urology as planned   Keep drinking fluids

## 2023-08-04 NOTE — Assessment & Plan Note (Signed)
Very good control with diet  Disc goals for lipids and reasons to control them Rev last labs with pt Rev low sat fat diet in detail  LDL of 88 High HDL

## 2023-08-04 NOTE — Assessment & Plan Note (Signed)
Last vitamin D Lab Results  Component Value Date   VD25OH 53.05 07/28/2023   Vitamin D level is therapeutic with current supplementation Disc importance of this to bone and overall health

## 2023-08-04 NOTE — Assessment & Plan Note (Addendum)
Kathy [name] was 06/2021  Has next one scheduled in april Currently taking prolia  Off evista No new falls or fracture Past spinal comp fracture   Discussed fall prevention, supplements and exercise for bone density

## 2023-08-06 ENCOUNTER — Other Ambulatory Visit: Payer: Self-pay | Admitting: *Deleted

## 2023-08-06 ENCOUNTER — Encounter: Payer: Self-pay | Admitting: Urology

## 2023-08-06 ENCOUNTER — Ambulatory Visit: Payer: Medicare Other | Admitting: Urology

## 2023-08-06 ENCOUNTER — Other Ambulatory Visit
Admission: RE | Admit: 2023-08-06 | Discharge: 2023-08-06 | Disposition: A | Discharge status: Home / Self Care | Payer: Medicare Other | Attending: Urology | Admitting: Urology

## 2023-08-06 VITALS — BP 171/69 | HR 65 | Ht 64.0 in | Wt 104.0 lb

## 2023-08-06 DIAGNOSIS — N200 Calculus of kidney: Secondary | ICD-10-CM

## 2023-08-06 LAB — URINALYSIS, COMPLETE (UACMP) WITH MICROSCOPIC
Bilirubin Urine: NEGATIVE
Glucose, UA: NEGATIVE mg/dL
Ketones, ur: NEGATIVE mg/dL
Nitrite: NEGATIVE
Protein, ur: NEGATIVE mg/dL
Specific Gravity, Urine: 1.015 (ref 1.005–1.030)
pH: 7 (ref 5.0–8.0)

## 2023-08-06 NOTE — Progress Notes (Signed)
   08/06/2023 11:07 AM   Mikki Santee Dec 11, 1940 [id number]  Reason for visit: Follow up left distal ureteral stone  HPI: 82 year old female who I originally saw on 07/30/2019 for with 1 week of intermittent left-sided groin and low back pain, CT scan on 07/25/2023 showed a 5 mm left distal ureteral stone with upstream hydronephrosis, and small nonobstructive left renal stones.  Urine culture was negative, she was started on Flomax and opted for trial of medical expulsive therapy.  Her pain has been very well-controlled since then.  She had 1 mild episode of some left-sided flank pain that resolved with Tylenol.  She has not yet seen a stone pass.  She did not give a urine today.  We again reviewed options including continued medical expulsive therapy for an additional 2 weeks, or moving to ureteroscopy and laser lithotripsy.  She would like to avoid surgery if possible and continue medical expulsive therapy, risk and benefits were discussed at length, return precautions were discussed.  Continue Flomax RTC 2 weeks urinalysis, if persistent stone at that time would recommend ureteroscopy  Sondra Come, MD  [facility] [address] [place], [place] [address] [phone]

## 2023-08-20 ENCOUNTER — Ambulatory Visit: Payer: Medicare Other | Admitting: Urology

## 2023-08-20 ENCOUNTER — Encounter: Payer: Self-pay | Admitting: Urology

## 2023-08-20 ENCOUNTER — Other Ambulatory Visit: Payer: Self-pay | Admitting: *Deleted

## 2023-08-20 ENCOUNTER — Other Ambulatory Visit
Admission: RE | Admit: 2023-08-20 | Discharge: 2023-08-20 | Disposition: A | Discharge status: Home / Self Care | Payer: Medicare Other | Attending: Urology | Admitting: Urology

## 2023-08-20 VITALS — BP 153/68 | HR 66 | Ht 64.0 in | Wt 104.0 lb

## 2023-08-20 DIAGNOSIS — N200 Calculus of kidney: Secondary | ICD-10-CM

## 2023-08-20 DIAGNOSIS — R1032 Left lower quadrant pain: Secondary | ICD-10-CM | POA: Diagnosis not present

## 2023-08-20 DIAGNOSIS — N201 Calculus of ureter: Secondary | ICD-10-CM | POA: Diagnosis not present

## 2023-08-20 LAB — URINALYSIS, COMPLETE (UACMP) WITH MICROSCOPIC
Bilirubin Urine: NEGATIVE
Glucose, UA: NEGATIVE mg/dL
Hgb urine dipstick: NEGATIVE
Ketones, ur: NEGATIVE mg/dL
Nitrite: NEGATIVE
Protein, ur: NEGATIVE mg/dL
Specific Gravity, Urine: 1.02 (ref 1.005–1.030)
pH: 7.5 (ref 5.0–8.0)

## 2023-08-20 NOTE — Progress Notes (Signed)
   08/20/2023 11:09 AM   Mikki Santee [birth year] [id number]  Reason for visit: Follow up left distal ureteral stone  HPI: 82 year old female who I originally saw on 07/30/2019 for with 1 week of intermittent left-sided groin and low back pain, CT scan on 07/25/2023 showed a 5 mm left distal ureteral stone with upstream hydronephrosis, and small nonobstructive left renal stones.  Urine culture was negative, she was started on Flomax and opted for trial of medical expulsive therapy.  We followed up on 08/06/2023 and she was not having any significant pain at that time and opted to continue medical expulsive therapy, urinalysis at that visit was benign.  She has been very good about straining her urine and has not seen a stone.  She continues to deny any gross hematuria or flank pain.  Urinalysis today does show 6-10 WBC, many bacteria, calcium oxalate crystals, no microscopic hematuria.  She denies any dysuria or UTI symptoms.  Urine was sent for culture.  Using shared decision making, she was interested in repeat CT scan to confirm clearance of left distal ureteral stone.  If stone is still present we will schedule left ureteroscopy, laser lithotripsy, stent placement   Sondra Come, MD  [facility] [address] [place], [place] [address] [phone]

## 2023-08-21 LAB — URINE CULTURE: Culture: NO GROWTH

## 2023-08-27 ENCOUNTER — Ambulatory Visit
Admission: RE | Admit: 2023-08-27 | Discharge: 2023-08-27 | Disposition: A | Discharge status: Home / Self Care | Payer: Medicare Other | Source: Ambulatory Visit | Attending: Urology | Admitting: Urology

## 2023-08-27 DIAGNOSIS — R109 Unspecified abdominal pain: Secondary | ICD-10-CM | POA: Diagnosis not present

## 2023-08-27 DIAGNOSIS — N289 Disorder of kidney and ureter, unspecified: Secondary | ICD-10-CM | POA: Diagnosis not present

## 2023-08-27 DIAGNOSIS — R1032 Left lower quadrant pain: Secondary | ICD-10-CM | POA: Insufficient documentation

## 2023-08-27 DIAGNOSIS — N281 Cyst of kidney, acquired: Secondary | ICD-10-CM | POA: Diagnosis not present

## 2023-08-27 DIAGNOSIS — N200 Calculus of kidney: Secondary | ICD-10-CM | POA: Diagnosis not present

## 2023-09-20 ENCOUNTER — Other Ambulatory Visit: Payer: Self-pay | Admitting: Family Medicine

## 2023-09-22 NOTE — Telephone Encounter (Signed)
Will route to urology who has taken over care for this issue

## 2023-11-28 ENCOUNTER — Telehealth: Payer: Self-pay

## 2023-11-28 NOTE — Telephone Encounter (Signed)
 Received fax for PA request from [facility] for orthosis. Rx Prior Authorization team does not do prior authorization for medical supplies. Form is located in media.

## 2023-11-28 NOTE — Telephone Encounter (Signed)
 Not sure who ordered this but we don't approve/order Ortho medical supplies/ braces   No further action needed

## 2024-01-10 ENCOUNTER — Other Ambulatory Visit: Payer: Self-pay | Admitting: Family Medicine

## 2024-01-12 NOTE — Telephone Encounter (Signed)
 Last filled on 07/28/23 #20 caps/ 0 refills   Last OV was on 08/04/23

## 2024-01-13 DIAGNOSIS — H401132 Primary open-angle glaucoma, bilateral, moderate stage: Secondary | ICD-10-CM | POA: Diagnosis not present

## 2024-01-20 ENCOUNTER — Ambulatory Visit
Admission: RE | Admit: 2024-01-20 | Discharge: 2024-01-20 | Disposition: A | Discharge status: Home / Self Care | Payer: Medicare Other | Source: Ambulatory Visit | Attending: Family Medicine | Admitting: Family Medicine

## 2024-01-20 ENCOUNTER — Encounter: Payer: Self-pay | Admitting: Family Medicine

## 2024-01-20 DIAGNOSIS — H401132 Primary open-angle glaucoma, bilateral, moderate stage: Secondary | ICD-10-CM | POA: Diagnosis not present

## 2024-01-20 DIAGNOSIS — H43813 Vitreous degeneration, bilateral: Secondary | ICD-10-CM | POA: Diagnosis not present

## 2024-01-20 DIAGNOSIS — Z780 Asymptomatic menopausal state: Secondary | ICD-10-CM

## 2024-01-20 DIAGNOSIS — Z961 Presence of intraocular lens: Secondary | ICD-10-CM | POA: Diagnosis not present

## 2024-01-20 DIAGNOSIS — N958 Other specified menopausal and perimenopausal disorders: Secondary | ICD-10-CM | POA: Diagnosis not present

## 2024-01-20 DIAGNOSIS — M8588 Other specified disorders of bone density and structure, other site: Secondary | ICD-10-CM | POA: Diagnosis not present

## 2024-01-30 ENCOUNTER — Other Ambulatory Visit: Payer: Self-pay | Admitting: Family Medicine

## 2024-01-30 NOTE — Telephone Encounter (Signed)
 Last filled on 01/12/24 #30 caps/ 0 refills (pt has a urologist)  Last OV was CPE on 08/04/23

## 2024-04-26 ENCOUNTER — Ambulatory Visit (INDEPENDENT_AMBULATORY_CARE_PROVIDER_SITE_OTHER): Payer: Medicare Other

## 2024-04-26 VITALS — BP 153/68 | Ht 65.0 in | Wt 98.0 lb

## 2024-04-26 DIAGNOSIS — Z0000 Encounter for general adult medical examination without abnormal findings: Secondary | ICD-10-CM | POA: Diagnosis not present

## 2024-04-26 NOTE — Progress Notes (Signed)
 Because this visit was a virtual/telehealth visit,  certain criteria was not obtained, such a blood pressure, CBG if applicable, and timed get up and go. Any medications not marked as taking were not mentioned during the medication reconciliation part of the visit. Any vitals not documented were not able to be obtained due to this being a telehealth visit or patient was unable to self-report a recent blood pressure reading due to a lack of equipment at home via telehealth. Vitals that have been documented are verbally provided by the patient.   This visit was performed by a medical professional under my direct supervision. I was immediately available for consultation/collaboration. I have reviewed and agree with the Annual Wellness Visit documentation.  Subjective:   Kathy [name] is a 83 y.o. who presents for a Medicare Wellness preventive visit.  As a reminder, Annual Wellness Visits don't include a physical exam, and some assessments may be limited, especially if this visit is performed virtually. We may recommend an in-person follow-up visit with your provider if needed.  Visit Complete: Virtual I connected with  Kathy [name] on 04/26/24 by a audio enabled telemedicine application and verified that I am speaking with the correct person using two identifiers.  Patient Location: Home  Provider Location: Home Office  I discussed the limitations of evaluation and management by telemedicine. The patient expressed understanding and agreed to proceed.  Vital Signs: Because this visit was a virtual/telehealth visit, some criteria may be missing or patient reported. Any vitals not documented were not able to be obtained and vitals that have been documented are patient reported.  VideoDeclined- This patient declined Librarian, academic. Therefore the visit was completed with audio only.  Persons Participating in Visit: Patient.  AWV Questionnaire: Yes:  Patient Medicare AWV questionnaire was completed by the patient on 04/22/2024 & 04/23/2024; I have confirmed that all information answered by patient is correct and no changes since this date.  Cardiac Risk Factors include: advanced age (>54men, >1 women);dyslipidemia     Objective:    Today's Vitals   04/26/24 1544  BP: (!) 153/68  Weight: 98 lb (44.5 kg)  Height: 5' 5 (1.651 m)   Body mass index is 16.31 kg/m.     04/26/2024    3:43 PM 04/23/2023   10:53 AM 01/29/2023    8:36 AM 04/19/2022    3:56 PM 04/18/2021   11:18 AM 07/05/2019    9:49 AM 06/26/2018   10:10 AM  Advanced Directives  Does Patient Have a Medical Advance Directive? Yes Yes Yes Yes Yes Yes Yes   Type of Estate agent of [place];Living will Healthcare Power of [place];Living will Healthcare Power of [place];Living will Healthcare Power of [place];Living will Healthcare Power of [place];Living will Healthcare Power of [place];Living will Living will;Healthcare Power of Attorney  Does patient want to make changes to medical advance directive? No - Patient declined No - Patient declined No - Patient declined No - Patient declined   No - Patient declined   Copy of Healthcare Power of Attorney in Chart? Yes - validated most recent copy scanned in chart (See row information) Yes - validated most recent copy scanned in chart (See row information) Yes - validated most recent copy scanned in chart (See row information) Yes - validated most recent copy scanned in chart (See row information) Yes - validated most recent copy scanned in chart (See row information) Yes - validated most recent copy scanned in chart (See row information)  No - copy requested      Data saved with a previous flowsheet row definition    Current Medications (verified) Outpatient Encounter Medications as of 04/26/2024  Medication Sig   aspirin 81 MG tablet Take 81 mg by mouth daily.   BRIMONIDINE  TARTRATE OP Place 1 drop into the  left eye 2 (two) times daily.   Calcium Carbonate-Vitamin D  (CALCIUM-VITAMIN D3) 600-125 MG-UNIT TABS Take by mouth.   cholecalciferol (VITAMIN D ) 1000 units tablet Take 5,000 Units by mouth daily.   cyanocobalamin 1000 MCG tablet Take 100 mcg by mouth daily.   dorzolamide-timolol  (COSOPT) 22.3-6.8 MG/ML ophthalmic solution Place 22.3 mLs into both eyes 2 (two) times daily.   fish oil-omega-3 fatty acids 1000 MG capsule Take 1 g by mouth daily.   Multiple Vitamin (MULTIVITAMIN) capsule Take 1 capsule by mouth daily.   VITAMIN D  PO Take 1 tablet by mouth daily.   VYZULTA 0.024 % SOLN Place 1 drop into the left eye at bedtime.   tamsulosin  (FLOMAX ) 0.4 MG CAPS capsule TAKE 1 CAPSULE (0.4 MG TOTAL) BY MOUTH DAILY AS NEEDED (FOR KIDNEY STONE).   No facility-administered encounter medications on file as of 04/26/2024.    Allergies (verified) Patient has no known allergies.   History: Past Medical History:  Diagnosis Date   Bilateral sensorineural hearing loss    Cancer (HCC)    Hx of breast CA   Glaucoma    Glaucoma (increased eye pressure)    HOH (hard of hearing)    wears hearing aids both ears   Hyperlipidemia    Osteoporosis    Vitamin D  deficiency    Past Surgical History:  Procedure Laterality Date   BREAST SURGERY  04/06/2001   mastectomy - left side   CATARACT EXTRACTION W/PHACO Right 01/29/2023   Procedure: CATARACT EXTRACTION PHACO AND INTRAOCULAR LENS PLACEMENT (IOC) RIGHT KAHOOK DUAL BLADE  GONIOTOMY  VISION BLUE  11.28  01:00.0;  Surgeon: Mittie Gaskin, MD;  Location: [facility];  Service: Ophthalmology;  Laterality: Right;   DILATION AND CURETTAGE OF UTERUS     EYE SURGERY  09/30/2008   cataract - left   MASTECTOMY  03/30/2001   left   TONSILLECTOMY     Family History  Problem Relation Age of Onset   Heart disease Mother        CHF   Hypertension Mother    Stroke Father    Hypertension Father    Osteoporosis Father    Heart disease Father         CHF   Breast cancer Cousin    Colon cancer Neg Hx    Social History   Socioeconomic History   Marital status: Married    Spouse name: Not on file   Number of children: Not on file   Years of education: Not on file   Highest education level: 12th grade  Occupational History   Not on file  Tobacco Use   Smoking status: Never   Smokeless tobacco: Never  Vaping Use   Vaping status: Never Used  Substance and Sexual Activity   Alcohol use: No   Drug use: No   Sexual activity: Not Currently    Birth control/protection: Coitus interruptus  Other Topics Concern   Not on file  Social History Narrative   Not on file   Social Drivers of Health   Financial Resource Strain: Low Risk  (04/26/2024)   Overall Financial Resource Strain (CARDIA)    Difficulty of Paying Living  Expenses: Not hard at all  Food Insecurity: No Food Insecurity (04/26/2024)   Hunger Vital Sign    Worried About Running Out of Food in the Last Year: Never true    Ran Out of Food in the Last Year: Never true  Transportation Needs: No Transportation Needs (04/23/2024)   PRAPARE - Administrator, Civil Service (Medical): No    Lack of Transportation (Non-Medical): No  Physical Activity: Sufficiently Active (04/26/2024)   Exercise Vital Sign    Days of Exercise per Week: 7 days    Minutes of Exercise per Session: 30 min  Stress: No Stress Concern Present (04/26/2024)   [employer] of Occupational Health - Occupational Stress Questionnaire    Feeling of Stress: Not at all  Social Connections: Moderately Integrated (04/26/2024)   Social Connection and Isolation Panel    Frequency of Communication with Friends and Family: More than three times a week    Frequency of Social Gatherings with Friends and Family: Once a week    Attends Religious Services: More than 4 times per year    Active Member of [employer] or Organizations: No    Attends Engineer, structural: Not on file    Marital Status:  Married    Tobacco Counseling Counseling given: Not Answered    Clinical Intake:  Pre-visit preparation completed: Yes  Pain : No/denies pain     BMI - recorded: 16.31 Nutritional Status: BMI <19  Underweight Nutritional Risks: None Diabetes: No  Lab Results  Component Value Date   HGBA1C 5.1 05/05/2007     How often do you need to have someone help you when you read instructions, pamphlets, or other written materials from your doctor or pharmacy?: 1 - Never What is the last grade level you completed in school?: some college  Interpreter Needed?: No  Information entered by :: Rahmel Nedved,CMA   Activities of Daily Living     04/22/2024    9:42 AM  In your present state of health, do you have any difficulty performing the following activities:  Hearing? 0  Vision? 0  Difficulty concentrating or making decisions? 0  Walking or climbing stairs? 0  Dressing or bathing? 0  Doing errands, shopping? 0  Preparing Food and eating ? N  Using the Toilet? N  In the past six months, have you accidently leaked urine? N  Do you have problems with loss of bowel control? N  Managing your Medications? N  Managing your Finances? N  Housekeeping or managing your Housekeeping? N    Patient Care Team: Tower, Laine LABOR, MD as PCP - General Mittie Gaskin, MD as Referring Physician (Ophthalmology)  I have updated your Care Teams any recent Medical Services you may have received from other providers in the past year.     Assessment:   This is a routine wellness examination for [place].  Hearing/Vision screen Hearing Screening - Comments:: Patient wears hearing aids Vision Screening - Comments:: Patient wears glasses    Goals Addressed             This Visit's Progress    Patient Stated   On track    Stay healthy and active       Depression Screen     04/26/2024    3:47 PM 04/23/2023   10:53 AM 04/19/2022    3:52 PM 04/18/2021   11:19 AM 07/05/2019     9:49 AM 06/26/2018   10:10 AM 06/18/2017    8:43 AM  PHQ 2/9 Scores  PHQ - 2 Score 0 0 0 0 0 0 0  PHQ- 9 Score 0   0 0 0 0    Fall Risk     04/22/2024    9:42 AM 04/23/2023   10:54 AM 04/19/2022    3:55 PM 04/18/2021   11:18 AM 07/05/2019    9:49 AM  Fall Risk   Falls in the past year? 0 0 0 0 1   Number falls in past yr: 0 0 0 0 0   Injury with Fall? 0 0 0 0 0  Risk for fall due to : No Fall Risks No Fall Risks No Fall Risks No Fall Risks Impaired balance/gait   Follow up Falls evaluation completed Falls prevention discussed;Falls evaluation completed  Falls evaluation completed;Falls prevention discussed  Falls evaluation completed;Falls prevention discussed      Data saved with a previous flowsheet row definition    MEDICARE RISK AT HOME:  Medicare Risk at Home Any stairs in or around the home?: (Patient-Rptd) Yes If so, are there any without handrails?: (Patient-Rptd) No Home free of loose throw rugs in walkways, pet beds, electrical cords, etc?: (Patient-Rptd) Yes Adequate lighting in your home to reduce risk of falls?: (Patient-Rptd) Yes Life alert?: (Patient-Rptd) No Use of a cane, walker or w/c?: (Patient-Rptd) No Grab bars in the bathroom?: (Patient-Rptd) No Shower chair or bench in shower?: (Patient-Rptd) Yes Elevated toilet seat or a handicapped toilet?: No  TIMED UP AND GO:  Was the test performed?  No  Cognitive Function: 6CIT completed    04/18/2021   11:23 AM 07/05/2019    9:53 AM 06/26/2018    2:09 PM 06/18/2017    8:43 AM  MMSE - Mini Mental State Exam  Orientation to time 5 5 5 5    Orientation to Place 5 5 5 5    Registration 3 3 3 3    Attention/ Calculation 5 5 0 0   Recall 3 3 3 3    Language- name 2 objects   0 0   Language- repeat 1 1 1 1   Language- follow 3 step command   3 3   Language- read & follow direction   0 0   Write a sentence   0 0   Copy design   0 0   Total score   20 20      Data saved with a previous flowsheet row definition         04/26/2024    3:48 PM 04/23/2023   10:56 AM 04/19/2022    3:56 PM  6CIT Screen  What Year? 0 points 0 points 0 points  What month? 0 points 0 points 0 points  What time? 0 points 0 points 0 points  Count back from 20 0 points 2 points 0 points  Months in reverse 0 points 4 points 0 points  Repeat phrase 0 points 4 points 0 points  Total Score 0 points 10 points 0 points    Immunizations Immunization History  Administered Date(s) Administered   Fluad Quad(high Dose 65+) 06/09/2019   Influenza, High Dose Seasonal PF 06/04/2017, 06/18/2018, 06/02/2020, 07/04/2022   Influenza,inj,Quad PF,6+ Mos 05/30/2014, 06/13/2015, 06/17/2016   Influenza-Unspecified 06/17/2013, 06/18/2023   PFIZER(Purple Top)SARS-COV-2 Vaccination 10/05/2019, 10/26/2019   PNEUMOCOCCAL CONJUGATE-20 07/04/2022   Pneumococcal Conjugate-13 05/30/2014   Pneumococcal Polysaccharide-23 05/09/2008   Td 02/25/2003   Zoster Recombinant(Shingrix) 01/14/2017, 07/17/2017   Zoster, Live 04/17/2006    Screening Tests Health Maintenance  Topic Date Due  DTaP/Tdap/Td (2 - Tdap) 07/21/2024 (Originally 02/24/2013)   COVID-19 Vaccine (3 - 2024-25 season) 08/06/2025 (Originally 06/01/2023)   INFLUENZA VACCINE  04/30/2024   MAMMOGRAM  07/27/2024   Medicare Annual Wellness (AWV)  04/26/2025   Pneumococcal Vaccine: 50+ Years  Completed   DEXA SCAN  Completed   Zoster Vaccines- Shingrix  Completed   Hepatitis B Vaccines  Aged Out   HPV VACCINES  Aged Out   Meningococcal B Vaccine  Aged Out    Health Maintenance  There are no preventive care reminders to display for this patient. Health Maintenance Items Addressed:nothing needed  Additional Screening:  Vision Screening: Recommended annual ophthalmology exams for early detection of glaucoma and other disorders of the eye. Would you like a referral to an eye doctor? No    Dental Screening: Recommended annual dental exams for proper oral hygiene  Community Resource  Referral / Chronic Care Management: CRR required this visit?  No   CCM required this visit?  No   Plan:    I have personally reviewed and noted the following in the patient's chart:   Medical and social history Use of alcohol, tobacco or illicit drugs  Current medications and supplements including opioid prescriptions. Patient is not currently taking opioid prescriptions. Functional ability and status Nutritional status Physical activity Advanced directives List of other physicians Hospitalizations, surgeries, and ER visits in previous 12 months Vitals Screenings to include cognitive, depression, and falls Referrals and appointments  In addition, I have reviewed and discussed with patient certain preventive protocols, quality metrics, and best practice recommendations. A written personalized care plan for preventive services as well as general preventive health recommendations were provided to patient.   Lyle MARLA Right, [place]   04/26/2024   After Visit Summary: (MyChart) Due to this being a telephonic visit, the after visit summary with patients personalized plan was offered to patient via MyChart   Notes: Nothing significant to report at this time.

## 2024-04-26 NOTE — Patient Instructions (Signed)
 [name] , Thank you for taking time out of your busy schedule to complete your Annual Wellness Visit with me. I enjoyed our conversation and look forward to speaking with you again next year. I, as well as your care team,  appreciate your ongoing commitment to your health goals. Please review the following plan we discussed and let me know if I can assist you in the future. Your Game plan/ To Do List    Referrals: If you haven't heard from the office you've been referred to, please reach out to them at the phone provided.  none Follow up Visits: Next Medicare AWV with our clinical staff: 04/27/2025   Have you seen your provider in the last 6 months (3 months if uncontrolled diabetes)? No Next Office Visit with your provider: n/a  Clinician Recommendations:  Aim for 30 minutes of exercise or brisk walking, 6-8 glasses of water, and 5 servings of fruits and vegetables each day.       This is a list of the screening recommended for you and due dates:  Health Maintenance  Topic Date Due   DTaP/Tdap/Td vaccine (2 - Tdap) 07/21/2024*   COVID-19 Vaccine (3 - 2024-25 season) 08/06/2025*   Flu Shot  04/30/2024   Mammogram  07/27/2024   Medicare Annual Wellness Visit  04/26/2025   Pneumococcal Vaccine for age over 16  Completed   DEXA scan (bone density measurement)  Completed   Zoster (Shingles) Vaccine  Completed   Hepatitis B Vaccine  Aged Out   HPV Vaccine  Aged Out   Meningitis B Vaccine  Aged Out  *Topic was postponed. The date shown is not the original due date.    Advanced directives: (Declined) Advance directive discussed with you today. Even though you declined this today, please call our office should you change your mind, and we can give you the proper paperwork for you to fill out. Advance Care Planning is important because it:  [x]  Makes sure you receive the medical care that is consistent with your values, goals, and preferences  [x]  It provides guidance to your family and  loved ones and reduces their decisional burden about whether or not they are making the right decisions based on your wishes.  Follow the link provided in your after visit summary or read over the paperwork we have mailed to you to help you started getting your Advance Directives in place. If you need assistance in completing these, please reach out to us  so that we can help you!  See attachments for Preventive Care and Fall Prevention Tips.

## 2024-06-03 DIAGNOSIS — Z23 Encounter for immunization: Secondary | ICD-10-CM | POA: Diagnosis not present

## 2024-06-18 ENCOUNTER — Ambulatory Visit: Payer: Self-pay | Admitting: *Deleted

## 2024-06-18 NOTE — Telephone Encounter (Signed)
 Aware, will watch for correspondence Agree with ER/UC precautions  Thanks for Dr Cleatus for seeing her

## 2024-06-18 NOTE — Telephone Encounter (Signed)
 FYI Only or Action Required?: FYI only for provider.  Patient was last seen in primary care on 08/04/2023 by Randeen Laine LABOR, MD.  Called Nurse Triage reporting Hip Pain.  Symptoms began several days ago.  Interventions attempted: OTC medications: tylenol, Rest, hydration, or home remedies, and Ice/heat application.  Symptoms are: gradually worsening.  Triage Disposition: See PCP When Office is Open (Within 3 Days)  Patient/caregiver understands and will follow disposition?: Yes  Patient requesting if earlier appt please call back.          Copied from CRM [id number]. Topic: Clinical - Red Word Triage >> Jun 18, 2024 12:27 PM Jasmin G wrote: Red Word that prompted transfer to Nurse Triage: Pt initially called to schedule an appt due to experiencing pain on her right side right behind hip bone. Reason for Disposition  [1] MODERATE pain (e.g., interferes with normal activities, limping) AND [2] present > 3 days  Answer Assessment - Initial Assessment Questions No available appt today as requested. Scheduled appt 06/21/24 with other provider none available with PCP. Recommended if pain worsens go to UC. Patient reports tylenol effective but pain comes and goes and does not want to take tylenol forever.      1. LOCATION and RADIATION: Where is the pain located? Does the pain spread (shoot) anywhere else?     Right hip area to buttocks  2. QUALITY: What does the pain feel like?  (e.g., sharp, dull, aching, burning)     Just hurts  3. SEVERITY: How bad is the pain? What does it keep you from doing?   (Scale 1-10; or mild, moderate, severe)     Intermittent severe at times  4. ONSET: When did the pain start? Does it come and go, or is it there all the time?     Comes and goes  5. WORK OR EXERCISE: Has there been any recent work or exercise that involved this part of the body?      No  6. CAUSE: What do you think is causing the hip pain?      Not sure  7.  AGGRAVATING FACTORS: What makes the hip pain worse? (e.g., walking, climbing stairs, running)     Not sure pain happens at random  8. OTHER SYMPTOMS: Do you have any other symptoms? (e.g., back pain, pain shooting down leg,  fever, rash)     Right hip area back , shoots down buttocks random times  Protocols used: Hip Pain-A-AH

## 2024-06-20 NOTE — Telephone Encounter (Signed)
 Noted. Thanks.

## 2024-06-21 ENCOUNTER — Ambulatory Visit (INDEPENDENT_AMBULATORY_CARE_PROVIDER_SITE_OTHER): Admitting: Family Medicine

## 2024-06-21 ENCOUNTER — Encounter: Payer: Self-pay | Admitting: Family Medicine

## 2024-06-21 VITALS — BP 128/74 | HR 54 | Temp 98.0°F | Ht 65.0 in | Wt 99.0 lb

## 2024-06-21 DIAGNOSIS — R195 Other fecal abnormalities: Secondary | ICD-10-CM | POA: Diagnosis not present

## 2024-06-21 LAB — COMPREHENSIVE METABOLIC PANEL WITH GFR
ALT: 11 U/L (ref 0–35)
AST: 16 U/L (ref 0–37)
Albumin: 4 g/dL (ref 3.5–5.2)
Alkaline Phosphatase: 55 U/L (ref 39–117)
BUN: 22 mg/dL (ref 6–23)
CO2: 29 meq/L (ref 19–32)
Calcium: 9.3 mg/dL (ref 8.4–10.5)
Chloride: 106 meq/L (ref 96–112)
Creatinine, Ser: 0.68 mg/dL (ref 0.40–1.20)
GFR: 80.13 mL/min (ref >60.00)
Glucose, Bld: 93 mg/dL (ref 70–99)
Potassium: 3.9 meq/L (ref 3.5–5.1)
Sodium: 141 meq/L (ref 135–145)
Total Bilirubin: 0.5 mg/dL (ref 0.2–1.2)
Total Protein: 6.4 g/dL (ref 6.0–8.3)

## 2024-06-21 LAB — CBC WITH DIFFERENTIAL/PLATELET
Basophils Absolute: 0 K/uL (ref 0.0–0.1)
Basophils Relative: 0.9 % (ref 0.0–3.0)
Eosinophils Absolute: 0.1 K/uL (ref 0.0–0.7)
Eosinophils Relative: 2.8 % (ref 0.0–5.0)
HCT: 37.1 % (ref 36.0–46.0)
Hemoglobin: 12.2 g/dL (ref 12.0–15.0)
Lymphocytes Relative: 25.3 % (ref 12.0–46.0)
Lymphs Abs: 1.1 K/uL (ref 0.7–4.0)
MCHC: 33 g/dL (ref 30.0–36.0)
MCV: 94.5 fl (ref 78.0–100.0)
Monocytes Absolute: 0.3 K/uL (ref 0.1–1.0)
Monocytes Relative: 6.3 % (ref 3.0–12.0)
Neutro Abs: 2.9 K/uL (ref 1.4–7.7)
Neutrophils Relative %: 64.7 % (ref 43.0–77.0)
Platelets: 148 K/uL — ABNORMAL LOW (ref 150.0–400.0)
RBC: 3.93 Mil/uL (ref 3.87–5.11)
RDW: 13.9 % (ref 11.5–15.5)
WBC: 4.5 K/uL (ref 4.0–10.5)

## 2024-06-21 NOTE — Patient Instructions (Signed)
 Don't change your meds for now.  Let us  know if you have more pain or concerns.  Go to the lab on the way out.   If you have mychart we'll likely use that to update you.    Take care.  Glad to see you.

## 2024-06-21 NOTE — Progress Notes (Unsigned)
 She woke up with pain in the R lower abdomen. Took tylenol and used a heating pad.  Then had R lower back pain.  No abd pain now.  No pain now.    Last back pain was yesterday.   Last abd pain was early two days ago.    No new meds.  No FCNAVD.  No blood in stools or urine.  She had some dark stools recently.  Last BM was this AM, still dark.  Not lightheaded.  No SOB  no CP.  No dysuria.    Colonoscopy 2014.  She walked 2 miles this AM w/o pain.

## 2024-06-23 ENCOUNTER — Ambulatory Visit: Payer: Self-pay | Admitting: Family Medicine

## 2024-06-23 ENCOUNTER — Other Ambulatory Visit (INDEPENDENT_AMBULATORY_CARE_PROVIDER_SITE_OTHER): Payer: Self-pay

## 2024-06-23 DIAGNOSIS — R195 Other fecal abnormalities: Secondary | ICD-10-CM | POA: Insufficient documentation

## 2024-06-23 NOTE — Assessment & Plan Note (Signed)
 Unclear if dark stools are related to her recent symptoms.  No pain now.  Benign exam.  Update us  if she has any recurrent symptoms.  See notes on labs.  DDx discussed with patient.  Okay for outpatient follow-up and she agrees with plan.  Routed to PCP as FYI.

## 2024-06-25 LAB — FECAL OCCULT BLOOD, IMMUNOCHEMICAL: Fecal Occult Bld: POSITIVE — AB

## 2024-06-30 NOTE — Addendum Note (Signed)
 Addended by: CLEATUS ARLYSS RAMAN on: 06/30/2024 09:48 PM   Modules accepted: Orders

## 2024-07-01 NOTE — Telephone Encounter (Signed)
 Dr. Cleatus changed this referral for the patient to be seen in [facility] not [facility]

## 2024-07-09 NOTE — Telephone Encounter (Signed)
 Copied from CRM [id number]. Topic: Referral - Status >> Jul 09, 2024  9:52 AM Berneda FALCON wrote: Reason for CRM: Patient would like an update on the GI referral that was sent on 9/30, please.  Patient  callback is [phone] (home) or cell [phone]  Called patient and gave her the phone number the clinic listed in Mychart message.  Pt reported that she is unable to get into mychart right now and has not seen phone number.

## 2024-07-19 DIAGNOSIS — H43813 Vitreous degeneration, bilateral: Secondary | ICD-10-CM | POA: Diagnosis not present

## 2024-07-19 DIAGNOSIS — H401132 Primary open-angle glaucoma, bilateral, moderate stage: Secondary | ICD-10-CM | POA: Diagnosis not present

## 2024-07-19 DIAGNOSIS — Z961 Presence of intraocular lens: Secondary | ICD-10-CM | POA: Diagnosis not present

## 2024-07-27 NOTE — Progress Notes (Unsigned)
 07/28/2024 [name] [id number] 1941/01/07  Gastroenterology Office Note    Referring Provider: Randeen Laine LABOR, MD Primary Care Physician:  Tower, Laine LABOR, MD  Primary GI Provider: Jinny Carmine, MD    Chief Complaint   Chief Complaint  Patient presents with   New Patient (Initial Visit)    Positive fecal occult test-dark stools x several weeks-pale look droplet of blood in toilet not mixed with stool- normal bowel movements-     History of Present Illness   Kathy [name] is a 83 y.o. female presenting today at the request of Tower, Laine LABOR, MD due to positive fecal occult blood test.   Patient was seen by PCP on 06/21/2024 with reports of dark stools and had a positive fecal occult blood test.  Patient reports that for the last 2 months her stools have been darker in color, but they are not black or tarry.  She endorses one episode of seeing a small amount of red blood in the toilet and when she wiped saw a small amount of blood on the tissue.  She has a bowel movement every day that is soft and formed, denies having to strain or having hard stools.  Rare NSAID use.  No alcohol use. Denies abdominal pain or unintentional weight loss.  Patient is active and drinks a lot of water. Patient has history of colon polyps.  No family history of colon cancer  06/23/2024 positive fecal occult.  CBC and CMET unremarkable on 06/21/2024.   Last colonoscopy was in 2014.  Past Medical History:  Diagnosis Date   Bilateral sensorineural hearing loss    Cancer (HCC)    Hx of breast CA   Glaucoma    Glaucoma (increased eye pressure)    HOH (hard of hearing)    wears hearing aids both ears   Hyperlipidemia    Osteoporosis    Vitamin D  deficiency     Past Surgical History:  Procedure Laterality Date   BREAST SURGERY  04/06/2001   mastectomy - left side   CATARACT EXTRACTION W/PHACO Right 01/29/2023   Procedure: CATARACT EXTRACTION PHACO AND INTRAOCULAR LENS PLACEMENT  (IOC) RIGHT KAHOOK DUAL BLADE  GONIOTOMY  VISION BLUE  11.28  01:00.0;  Surgeon: Mittie Gaskin, MD;  Location: [facility];  Service: Ophthalmology;  Laterality: Right;   DILATION AND CURETTAGE OF UTERUS     EYE SURGERY  09/30/2008   cataract - left   MASTECTOMY  03/30/2001   left   TONSILLECTOMY      Current Outpatient Medications  Medication Sig Dispense Refill   aspirin 81 MG tablet Take 81 mg by mouth daily.     BRIMONIDINE  TARTRATE OP Place 1 drop into the left eye 2 (two) times daily.     Calcium Carbonate-Vitamin D  (CALCIUM-VITAMIN D3) 600-125 MG-UNIT TABS Take by mouth.     cholecalciferol (VITAMIN D ) 1000 units tablet Take 5,000 Units by mouth daily.     cyanocobalamin 1000 MCG tablet Take 100 mcg by mouth daily.     dorzolamide-timolol  (COSOPT) 22.3-6.8 MG/ML ophthalmic solution Place 22.3 mLs into both eyes 2 (two) times daily.     fish oil-omega-3 fatty acids 1000 MG capsule Take 1 g by mouth daily.     Multiple Vitamin (MULTIVITAMIN) capsule Take 1 capsule by mouth daily.     Na Sulfate-K Sulfate-Mg Sulfate concentrate (SUPREP) 17.5-3.13-1.6 GM/177ML SOLN Take 1 kit (354 mLs total) by mouth once for 1 dose. At 5 PM the day  before your procedure pour the contents of one bottle of Suprep into the mixing container provided.  Fill the container, with ice cold water, up to the 16 oz fill line, and drink the entire amount. Then 5 hours before procedure pour the contents of the second bottle of Suprep into the mixing container provided and follow the same instructions. 354 mL 0   VITAMIN D  PO Take 1 tablet by mouth daily.     VYZULTA 0.024 % SOLN Place 1 drop into the left eye at bedtime.     No current facility-administered medications for this visit.    Allergies as of 07/28/2024   (No Known Allergies)    Family History  Problem Relation Age of Onset   Heart disease Mother        CHF   Hypertension Mother    Stroke Father    Hypertension Father     Osteoporosis Father    Heart disease Father        CHF   Breast cancer Cousin    Colon cancer Neg Hx     Social History   Socioeconomic History   Marital status: Married    Spouse name: Not on file   Number of children: Not on file   Years of education: Not on file   Highest education level: 12th grade  Occupational History   Not on file  Tobacco Use   Smoking status: Never   Smokeless tobacco: Never  Vaping Use   Vaping status: Never Used  Substance and Sexual Activity   Alcohol use: No   Drug use: No   Sexual activity: Not Currently    Birth control/protection: Coitus interruptus  Other Topics Concern   Not on file  Social History Narrative   Not on file   Social Drivers of Health   Financial Resource Strain: Low Risk  (04/26/2024)   Overall Financial Resource Strain (CARDIA)    Difficulty of Paying Living Expenses: Not hard at all  Food Insecurity: No Food Insecurity (04/26/2024)   Hunger Vital Sign    Worried About Running Out of Food in the Last Year: Never true    Ran Out of Food in the Last Year: Never true  Transportation Needs: No Transportation Needs (04/23/2024)   PRAPARE - Administrator, Civil Service (Medical): No    Lack of Transportation (Non-Medical): No  Physical Activity: Sufficiently Active (04/26/2024)   Exercise Vital Sign    Days of Exercise per Week: 7 days    Minutes of Exercise per Session: 30 min  Stress: No Stress Concern Present (04/26/2024)   [employer] of Occupational Health - Occupational Stress Questionnaire    Feeling of Stress: Not at all  Social Connections: Moderately Integrated (04/26/2024)   Social Connection and Isolation Panel    Frequency of Communication with Friends and Family: More than three times a week    Frequency of Social Gatherings with Friends and Family: Once a week    Attends Religious Services: More than 4 times per year    Active Member of [employer] or Organizations: No    Attends Tax Inspector Meetings: Not on file    Marital Status: Married  Catering Manager Violence: Not At Risk (04/26/2024)   Humiliation, Afraid, Rape, and Kick questionnaire    Fear of Current or Ex-Partner: No    Emotionally Abused: No    Physically Abused: No    Sexually Abused: No     RELEVANT GI HISTORY, IMAGING  AND LABS: CBC    Component Value Date/Time   WBC 4.5 06/21/2024 1227   RBC 3.93 06/21/2024 1227   HGB 12.2 06/21/2024 1227   HGB 13.7 02/11/2011 0955   HCT 37.1 06/21/2024 1227   HCT 40.4 02/11/2011 0955   PLT 148.0 (L) 06/21/2024 1227   PLT 158 02/11/2011 0955   MCV 94.5 06/21/2024 1227   MCV 94.7 02/11/2011 0955   MCH 32.2 02/11/2011 0955   MCHC 33.0 06/21/2024 1227   RDW 13.9 06/21/2024 1227   RDW 13.7 02/11/2011 0955   LYMPHSABS 1.1 06/21/2024 1227   LYMPHSABS 1.2 02/11/2011 0955   MONOABS 0.3 06/21/2024 1227   MONOABS 0.3 02/11/2011 0955   EOSABS 0.1 06/21/2024 1227   EOSABS 0.1 02/11/2011 0955   BASOSABS 0.0 06/21/2024 1227   BASOSABS 0.0 02/11/2011 0955   Recent Labs    06/21/24 1227  HGB 12.2    CMP     Component Value Date/Time   NA 141 06/21/2024 1227   K 3.9 06/21/2024 1227   CL 106 06/21/2024 1227   CO2 29 06/21/2024 1227   GLUCOSE 93 06/21/2024 1227   BUN 22 06/21/2024 1227   CREATININE 0.68 06/21/2024 1227   CALCIUM 9.3 06/21/2024 1227   PROT 6.4 06/21/2024 1227   ALBUMIN 4.0 06/21/2024 1227   AST 16 06/21/2024 1227   ALT 11 06/21/2024 1227   ALKPHOS 55 06/21/2024 1227   BILITOT 0.5 06/21/2024 1227   GFRNONAA 113.74 05/11/2010 0903   GFRAA 128 05/09/2008 1018      Latest Ref Rng & Units 06/21/2024   12:27 PM 07/28/2023    7:59 AM 07/22/2023    4:17 PM  Hepatic Function  Total Protein 6.0 - 8.3 g/dL 6.4  6.3  6.4   Albumin 3.5 - 5.2 g/dL 4.0  4.0  3.9   AST 0 - 37 U/L 16  17  16    ALT 0 - 35 U/L 11  12  10    Alk Phosphatase 39 - 117 U/L 55  45  41   Total Bilirubin 0.2 - 1.2 mg/dL 0.5  0.4  0.5       Review of Systems    All systems reviewed and negative except where noted in HPI.    Physical Exam  BP (!) 173/77   Pulse 66   Temp 98.7 F (37.1 C)   Ht 5' 5 (1.651 m)   Wt 99 lb 3.2 oz (45 kg)   SpO2 99%   BMI 16.51 kg/m  No LMP recorded. Patient is postmenopausal. General:   Alert and oriented. Pleasant and cooperative. Well-nourished and well-developed. NAD Head:  Normocephalic and atraumatic. Eyes:  Without icterus Ears:  Normal auditory acuity. Lungs:  Respirations even and unlabored.  Clear throughout to auscultation.   No wheezes, crackles, or rhonchi. No acute distress. Heart:  Regular rate and rhythm; no murmurs, clicks, rubs, or gallops. Abdomen:  Normal bowel sounds.  No bruits.  Soft, non-tender and non-distended without masses, hepatosplenomegaly or hernias noted.  No guarding or rebound tenderness.   Rectal:  Deferred. Msk:  Symmetrical without gross deformities. Normal posture. Extremities:  Without edema. Neurologic:  Alert and  oriented x4;  grossly normal neurologically. Skin:  Intact without significant lesions or rashes. Psych:  Alert and cooperative. Normal mood and affect.   Assessment & Plan   Kathy [name] is a 83 y.o. female presenting today due to positive fecal occult test and dark stools.  She had 1 episode  of rectal bleeding.  No abdominal pain, weight loss, melena.  Will proceed with colonoscopy in near future: the risks, benefits, and alternatives have been discussed with the patient in detail, which include, but are not limited to: bleeding, infection, perforation & drug reaction. The patient states understanding and desires to proceed.    The patient was found to have elevated systolic blood pressure when vital signs were checked in the office. The blood pressure was rechecked by the nursing staff and it was found be persistently elevated >140 systolic. Patient advised to follow up closely with PCP for hypertension control.  Followup as needed pending  colonoscopy results  Grayce Bohr, DNP, AGNP-C [facility]

## 2024-07-28 ENCOUNTER — Ambulatory Visit (INDEPENDENT_AMBULATORY_CARE_PROVIDER_SITE_OTHER): Admitting: Family Medicine

## 2024-07-28 ENCOUNTER — Encounter: Payer: Self-pay | Admitting: Family Medicine

## 2024-07-28 VITALS — BP 173/77 | HR 66 | Temp 98.7°F | Ht 65.0 in | Wt 99.2 lb

## 2024-07-28 DIAGNOSIS — K625 Hemorrhage of anus and rectum: Secondary | ICD-10-CM

## 2024-07-28 DIAGNOSIS — Z860100 Personal history of colon polyps, unspecified: Secondary | ICD-10-CM

## 2024-07-28 DIAGNOSIS — R195 Other fecal abnormalities: Secondary | ICD-10-CM

## 2024-07-28 MED ORDER — NA SULFATE-K SULFATE-MG SULF 17.5-3.13-1.6 GM/177ML PO SOLN: 1.0000 | Freq: Once | ORAL | 0 refills | Status: DC

## 2024-07-28 NOTE — Patient Instructions (Signed)
 Please pick up your bowel prep at the pharmacy.

## 2024-08-08 ENCOUNTER — Other Ambulatory Visit: Payer: Self-pay | Admitting: Family Medicine

## 2024-09-10 ENCOUNTER — Other Ambulatory Visit: Payer: Self-pay | Admitting: Family Medicine

## 2024-09-10 DIAGNOSIS — Z1231 Encounter for screening mammogram for malignant neoplasm of breast: Secondary | ICD-10-CM

## 2024-09-10 DIAGNOSIS — Z961 Presence of intraocular lens: Secondary | ICD-10-CM | POA: Diagnosis not present

## 2024-10-05 ENCOUNTER — Encounter: Payer: Self-pay | Admitting: Gastroenterology

## 2024-10-05 ENCOUNTER — Ambulatory Visit

## 2024-10-05 ENCOUNTER — Other Ambulatory Visit: Payer: Self-pay

## 2024-10-05 ENCOUNTER — Ambulatory Visit
Admission: RE | Admit: 2024-10-05 | Discharge: 2024-10-05 | Disposition: A | Discharge status: Home / Self Care | Attending: Gastroenterology | Admitting: Gastroenterology

## 2024-10-05 ENCOUNTER — Encounter
Admission: RE | Disposition: A | Discharge status: Home / Self Care | Payer: Self-pay | Source: Home / Self Care | Attending: Gastroenterology

## 2024-10-05 DIAGNOSIS — K5730 Diverticulosis of large intestine without perforation or abscess without bleeding: Secondary | ICD-10-CM | POA: Diagnosis not present

## 2024-10-05 DIAGNOSIS — K640 First degree hemorrhoids: Secondary | ICD-10-CM | POA: Insufficient documentation

## 2024-10-05 DIAGNOSIS — K6389 Other specified diseases of intestine: Secondary | ICD-10-CM | POA: Diagnosis not present

## 2024-10-05 DIAGNOSIS — K635 Polyp of colon: Secondary | ICD-10-CM

## 2024-10-05 DIAGNOSIS — K921 Melena: Secondary | ICD-10-CM | POA: Diagnosis present

## 2024-10-05 DIAGNOSIS — K625 Hemorrhage of anus and rectum: Secondary | ICD-10-CM

## 2024-10-05 HISTORY — PX: COLONOSCOPY: SHX5424

## 2024-10-05 HISTORY — PX: POLYPECTOMY: SHX149

## 2024-10-05 SURGERY — COLONOSCOPY
Anesthesia: General

## 2024-10-05 MED ORDER — EPHEDRINE 5 MG/ML INJ
INTRAVENOUS | Status: AC
  Filled 2024-10-05: qty 5

## 2024-10-05 MED ORDER — SODIUM CHLORIDE 0.9 % IV SOLN: INTRAVENOUS | Status: DC

## 2024-10-05 MED ORDER — PROPOFOL 500 MG/50ML IV EMUL
INTRAVENOUS | Status: DC | PRN
  Administered 2024-10-05: 50 ug/kg/min via INTRAVENOUS

## 2024-10-05 MED ORDER — LIDOCAINE HCL (CARDIAC) PF 100 MG/5ML IV SOSY
PREFILLED_SYRINGE | INTRAVENOUS | Status: DC | PRN
  Administered 2024-10-05: 40 mg via INTRAVENOUS

## 2024-10-05 MED ORDER — PROPOFOL 10 MG/ML IV BOLUS
INTRAVENOUS | Status: DC | PRN
  Administered 2024-10-05: 30 mg via INTRAVENOUS
  Administered 2024-10-05: 20 mg via INTRAVENOUS

## 2024-10-05 NOTE — Anesthesia Preprocedure Evaluation (Signed)
"                                    Anesthesia Evaluation  Patient identified by MRN, date of birth, ID band Patient awake    Reviewed: Allergy & Precautions, H&P , NPO status , Patient's Chart, lab work & pertinent test results, reviewed documented beta blocker date and time   Airway Mallampati: II  TM Distance: >3 FB Neck ROM: Full    Dental no notable dental hx. (+) Teeth Intact   Pulmonary neg pulmonary ROS   Pulmonary exam normal breath sounds clear to auscultation       Cardiovascular Exercise Tolerance: Good negative cardio ROS Normal cardiovascular exam Rhythm:Regular Rate:Normal     Neuro/Psych negative neurological ROS  negative psych ROS   GI/Hepatic negative GI ROS, Neg liver ROS,,,  Endo/Other  negative endocrine ROS    Renal/GU Renal diseasenegative Renal ROS  negative genitourinary   Musculoskeletal negative musculoskeletal ROS (+)    Abdominal   Peds negative pediatric ROS (+)  Hematology negative hematology ROS (+)   Anesthesia Other Findings   Reproductive/Obstetrics negative OB ROS                              Anesthesia Physical Anesthesia Plan  ASA: 2  Anesthesia Plan: MAC and General   Post-op Pain Management: Minimal or no pain anticipated   Induction: Intravenous  PONV Risk Score and Plan: 1  Airway Management Planned: Mask  Additional Equipment:   Intra-op Plan:   Post-operative Plan:   Informed Consent: I have reviewed the patients History and Physical, chart, labs and discussed the procedure including the risks, benefits and alternatives for the proposed anesthesia with the patient or authorized representative who has indicated his/her understanding and acceptance.     Dental advisory given  Plan Discussed with: CRNA  Anesthesia Plan Comments:         Anesthesia Quick Evaluation  "

## 2024-10-05 NOTE — Op Note (Signed)
 [facility] Patient Name: Kathy [name] Procedure Date: 10/05/2024 10:42 AM MRN: [id number] Account #: [id number] Date of Birth: [birth year] Admit Type: Outpatient Age: 84 Room: [facility] ENDO ROOM 4 Gender: Female Note Status: Finalized Instrument Name: Colon Scope [id number] Procedure:             Colonoscopy Indications:           Hematochezia Providers:             Rogelia Copping MD, MD Referring MD:          Laine LABOR. Tower (Referring MD) Medicines:             Propofol  per Anesthesia Complications:         No immediate complications. Procedure:             Pre-Anesthesia Assessment:                        - Prior to the procedure, a History and Physical was                         performed, and patient medications and allergies were                         reviewed. The patient's tolerance of previous                         anesthesia was also reviewed. The risks and benefits                         of the procedure and the sedation options and risks                         were discussed with the patient. All questions were                         answered, and informed consent was obtained. Prior                         Anticoagulants: The patient has taken no anticoagulant                         or antiplatelet agents. ASA Grade Assessment: II - A                         patient with mild systemic disease. After reviewing                         the risks and benefits, the patient was deemed in                         satisfactory condition to undergo the procedure.                        After obtaining informed consent, the colonoscope was                         passed under direct vision. Throughout the procedure,  the patient's blood pressure, pulse, and oxygen                         saturations were monitored continuously. The                         Colonoscope was introduced through the anus and                          advanced to the the cecum, identified by appendiceal                         orifice and ileocecal valve. The colonoscopy was                         performed without difficulty. The patient tolerated                         the procedure well. The quality of the bowel                         preparation was excellent. Findings:      The perianal and digital rectal examinations were normal.      A 3 mm polyp was found in the sigmoid colon. The polyp was sessile. The       polyp was removed with a cold snare. Resection and retrieval were       complete.      A few small-mouthed diverticula were found in the sigmoid colon.      Non-bleeding internal hemorrhoids were found during retroflexion. The       hemorrhoids were Grade I (internal hemorrhoids that do not prolapse). Impression:            - One 3 mm polyp in the sigmoid colon, removed with a                         cold snare. Resected and retrieved.                        - Diverticulosis in the sigmoid colon.                        - Non-bleeding internal hemorrhoids. Recommendation:        - Discharge patient to home.                        - Resume previous diet.                        - Continue present medications.                        - Await pathology results.                        - Repeat colonoscopy is not recommended for                         surveillance. Procedure Code(s):     --- Professional ---  54614, Colonoscopy, flexible; with removal of                         tumor(s), polyp(s), or other lesion(s) by snare                         technique Diagnosis Code(s):     --- Professional ---                        K92.1, Melena (includes Hematochezia)                        D12.5, Benign neoplasm of sigmoid colon CPT copyright 2022 American Medical Association. All rights reserved. The codes documented in this report are preliminary and upon coder review may  be revised to meet current  compliance requirements. Rogelia Copping MD, MD 10/05/2024 11:18:24 AM This report has been signed electronically. Number of Addenda: 0 Note Initiated On: 10/05/2024 10:42 AM Scope Withdrawal Time: 0 hours 9 minutes 18 seconds  Total Procedure Duration: 0 hours 18 minutes 11 seconds  Estimated Blood Loss:  Estimated blood loss: none.      [facility]

## 2024-10-05 NOTE — H&P (Signed)
 "  Kathy Copping, MD [facility] [address] [place], [place] [address] [phone] Fax : [phone]  Primary Care Physician:  Tower, Laine LABOR, MD Primary Gastroenterologist:  Dr. Copping  Pre-Procedure History & Physical: HPI:  Kathy [name] is a 84 y.o. female is here for an colonoscopy.   Past Medical History:  Diagnosis Date   Bilateral sensorineural hearing loss    Cancer (HCC)    Hx of breast CA   Glaucoma    Glaucoma (increased eye pressure)    HOH (hard of hearing)    wears hearing aids both ears   Hyperlipidemia    Osteoporosis    Vitamin D  deficiency     Past Surgical History:  Procedure Laterality Date   BREAST SURGERY  04/06/2001   mastectomy - left side   CATARACT EXTRACTION W/PHACO Right 01/29/2023   Procedure: CATARACT EXTRACTION PHACO AND INTRAOCULAR LENS PLACEMENT (IOC) RIGHT KAHOOK DUAL BLADE  GONIOTOMY  VISION BLUE  11.28  01:00.0;  Surgeon: Mittie Gaskin, MD;  Location: [facility];  Service: Ophthalmology;  Laterality: Right;   DILATION AND CURETTAGE OF UTERUS     EYE SURGERY  09/30/2008   cataract - left   MASTECTOMY  03/30/2001   left   TONSILLECTOMY      Prior to Admission medications  Medication Sig Start Date End Date Taking? Authorizing Provider  aspirin 81 MG tablet Take 81 mg by mouth daily.   Yes [provider]  Calcium Carbonate-Vitamin D  (CALCIUM-VITAMIN D3) 600-125 MG-UNIT TABS Take by mouth.   Yes [provider]  cholecalciferol (VITAMIN D ) 1000 units tablet Take 5,000 Units by mouth daily.   Yes [provider]  BRIMONIDINE  TARTRATE OP Place 1 drop into the left eye 2 (two) times daily.    [provider]  cyanocobalamin 1000 MCG tablet Take 100 mcg by mouth daily.    [provider]  dorzolamide-timolol  (COSOPT) 22.3-6.8 MG/ML ophthalmic solution Place 22.3 mLs into both eyes 2 (two) times daily. 10/13/14   [provider]  fish oil-omega-3 fatty acids  1000 MG capsule Take 1 g by mouth daily.    [provider]  Multiple Vitamin (MULTIVITAMIN) capsule Take 1 capsule by mouth daily.    [provider]  VITAMIN D  PO Take 1 tablet by mouth daily.    [provider]  VYZULTA 0.024 % SOLN Place 1 drop into the left eye at bedtime.    [provider]    Allergies as of 07/28/2024   (No Known Allergies)    Family History  Problem Relation Age of Onset   Heart disease Mother        CHF   Hypertension Mother    Stroke Father    Hypertension Father    Osteoporosis Father    Heart disease Father        CHF   Breast cancer Cousin    Colon cancer Neg Hx     Social History   Socioeconomic History   Marital status: Married    Spouse name: Not on file   Number of children: Not on file   Years of education: Not on file   Highest education level: 12th grade  Occupational History   Not on file  Tobacco Use   Smoking status: Never   Smokeless tobacco: Never  Vaping Use   Vaping status: Never Used  Substance and Sexual Activity   Alcohol use: No   Drug use: No   Sexual  activity: Not Currently    Birth control/protection: Coitus interruptus  Other Topics Concern   Not on file  Social History Narrative   Not on file   Social Drivers of Health   Tobacco Use: Low Risk (10/05/2024)   Patient History    Smoking Tobacco Use: Never    Smokeless Tobacco Use: Never    Passive Exposure: Not on file  Financial Resource Strain: Low Risk (04/26/2024)   Overall Financial Resource Strain (CARDIA)    Difficulty of Paying Living Expenses: Not hard at all  Food Insecurity: No Food Insecurity (04/26/2024)   Epic    Worried About Radiation Protection Practitioner of Food in the Last Year: Never true    Ran Out of Food in the Last Year: Never true  Transportation Needs: No Transportation Needs (04/23/2024)   Epic    Lack of Transportation (Medical): No    Lack of Transportation (Non-Medical): No  Physical Activity: Sufficiently  Active (04/26/2024)   Exercise Vital Sign    Days of Exercise per Week: 7 days    Minutes of Exercise per Session: 30 min  Stress: No Stress Concern Present (04/26/2024)   [employer] of Occupational Health - Occupational Stress Questionnaire    Feeling of Stress: Not at all  Social Connections: Moderately Integrated (04/26/2024)   Social Connection and Isolation Panel    Frequency of Communication with Friends and Family: More than three times a week    Frequency of Social Gatherings with Friends and Family: Once a week    Attends Religious Services: More than 4 times per year    Active Member of [employer] or Organizations: No    Attends Banker Meetings: Not on file    Marital Status: Married  Intimate Partner Violence: Not At Risk (04/26/2024)   Epic    Fear of Current or Ex-Partner: No    Emotionally Abused: No    Physically Abused: No    Sexually Abused: No  Depression (PHQ2-9): Low Risk (04/26/2024)   Depression (PHQ2-9)    PHQ-2 Score: 0  Alcohol Screen: Low Risk (04/26/2024)   Alcohol Screen    Last Alcohol Screening Score (AUDIT): 0  Housing: Unknown (04/23/2024)   Epic    Unable to Pay for Housing in the Last Year: Patient declined    Number of Times Moved in the Last Year: Not on file    Homeless in the Last Year: No  Utilities: Not At Risk (04/26/2024)   Epic    Threatened with loss of utilities: No  Health Literacy: Adequate Health Literacy (04/26/2024)   B1300 Health Literacy    Frequency of need for help with medical instructions: Never    Review of Systems: See HPI, otherwise negative ROS  Physical Exam: BP (!) 171/78   Pulse 68   Temp (!) 96 F (35.6 C) (Temporal)   Resp 18   Ht 5' 7 (1.702 m)   Wt 42.9 kg   SpO2 100%   BMI 14.82 kg/m  General:   Alert,  pleasant and cooperative in NAD Head:  Normocephalic and atraumatic. Neck:  Supple; no masses or thyromegaly. Lungs:  Clear throughout to auscultation.    Heart:  Regular rate and  rhythm. Abdomen:  Soft, nontender and nondistended. Normal bowel sounds, without guarding, and without rebound.   Neurologic:  Alert and  oriented x4;  grossly normal neurologically.  Impression/Plan: Kathy [name] is here for an colonoscopy to be performed for rectal bleeding  Risks, benefits, limitations, and alternatives  regarding  colonoscopy have been reviewed with the patient.  Questions have been answered.  All parties agreeable.   Kathy Copping, MD  10/05/2024, 10:45 AM "

## 2024-10-05 NOTE — Anesthesia Postprocedure Evaluation (Signed)
"   Anesthesia Post Note  Patient: Kathy [name]  Procedure(s) Performed: COLONOSCOPY POLYPECTOMY, INTESTINE  Patient location during evaluation: PACU Anesthesia Type: General Level of consciousness: awake and alert Pain management: pain level controlled Vital Signs Assessment: post-procedure vital signs reviewed and stable Respiratory status: spontaneous breathing, nonlabored ventilation, respiratory function stable and patient connected to nasal cannula oxygen Cardiovascular status: blood pressure returned to baseline and stable Postop Assessment: no apparent nausea or vomiting Anesthetic complications: no   No notable events documented.   Last Vitals:  Vitals:   10/05/24 1007 10/05/24 1121  BP: (!) 171/78 124/61  Pulse: 68 61  Resp: 18 16  Temp: (!) 35.6 C (!) 35.9 C  SpO2: 100% 99%    Last Pain:  Vitals:   10/05/24 1121  TempSrc: Temporal  PainSc: 0-No pain                 Redell MARLA Breaker      "

## 2024-10-05 NOTE — Transfer of Care (Signed)
 Immediate Anesthesia Transfer of Care Note  Patient: Kathy [name]  Procedure(s) Performed: COLONOSCOPY POLYPECTOMY, INTESTINE  Patient Location: PACU  Anesthesia Type:General  Level of Consciousness: sedated  Airway & Oxygen Therapy: Patient Spontanous Breathing  Post-op Assessment: Report given to RN and Post -op Vital signs reviewed and stable  Post vital signs: Reviewed and stable  Last Vitals:  Vitals Value Taken Time  BP    Temp    Pulse 59 10/05/24 11:21  Resp 14 10/05/24 11:21  SpO2 100 % 10/05/24 11:21  Vitals shown include unfiled device data.  Last Pain:  Vitals:   10/05/24 1007  TempSrc: Temporal  PainSc: 0-No pain         Complications: No notable events documented.

## 2024-10-06 ENCOUNTER — Ambulatory Visit: Payer: Self-pay | Admitting: Gastroenterology

## 2024-10-06 LAB — SURGICAL PATHOLOGY

## 2024-10-11 ENCOUNTER — Ambulatory Visit

## 2024-10-11 ENCOUNTER — Other Ambulatory Visit: Payer: Self-pay | Admitting: Family Medicine

## 2024-10-11 DIAGNOSIS — Z1231 Encounter for screening mammogram for malignant neoplasm of breast: Secondary | ICD-10-CM

## 2024-10-13 ENCOUNTER — Ambulatory Visit: Payer: Self-pay | Admitting: Family Medicine

## 2025-04-28 ENCOUNTER — Ambulatory Visit
